# Patient Record
Sex: Male | Born: 1970
Health system: Southern US, Community
[De-identification: ages and names within clinical notes are randomized; demographics above are authoritative.]

## PROBLEM LIST (undated history)

## (undated) DIAGNOSIS — G44009 Cluster headache syndrome, unspecified, not intractable: Secondary | ICD-10-CM

## (undated) DIAGNOSIS — I10 Essential (primary) hypertension: Secondary | ICD-10-CM

## (undated) DIAGNOSIS — Z8619 Personal history of other infectious and parasitic diseases: Secondary | ICD-10-CM

## (undated) DIAGNOSIS — M1990 Unspecified osteoarthritis, unspecified site: Secondary | ICD-10-CM

## (undated) DIAGNOSIS — E785 Hyperlipidemia, unspecified: Secondary | ICD-10-CM

## (undated) DIAGNOSIS — K648 Other hemorrhoids: Secondary | ICD-10-CM

## (undated) DIAGNOSIS — M549 Dorsalgia, unspecified: Secondary | ICD-10-CM

## (undated) HISTORY — DX: Hyperlipidemia, unspecified: E78.5

## (undated) HISTORY — DX: Essential (primary) hypertension: I10

## (undated) HISTORY — DX: Residual hemorrhoidal skin tags: K64.8

## (undated) HISTORY — DX: Cluster headache syndrome, unspecified, not intractable: G44.009

## (undated) HISTORY — PX: NO PAST SURGERIES: SHX2092

## (undated) HISTORY — DX: Dorsalgia, unspecified: M54.9

## (undated) HISTORY — PX: COLONOSCOPY: SHX174

## (undated) HISTORY — DX: Personal history of other infectious and parasitic diseases: Z86.19

## (undated) HISTORY — PX: OTHER SURGICAL HISTORY: SHX169

---

## 2012-05-07 ENCOUNTER — Encounter (HOSPITAL_COMMUNITY): Payer: Self-pay | Admitting: *Deleted

## 2012-05-07 ENCOUNTER — Emergency Department (HOSPITAL_COMMUNITY)
Admission: EM | Admit: 2012-05-07 | Discharge: 2012-05-07 | Disposition: A | Discharge status: Home / Self Care | Payer: BC Managed Care – PPO | Attending: Emergency Medicine | Admitting: Emergency Medicine

## 2012-05-07 DIAGNOSIS — F17200 Nicotine dependence, unspecified, uncomplicated: Secondary | ICD-10-CM | POA: Insufficient documentation

## 2012-05-07 DIAGNOSIS — Y9289 Other specified places as the place of occurrence of the external cause: Secondary | ICD-10-CM | POA: Insufficient documentation

## 2012-05-07 DIAGNOSIS — Z79899 Other long term (current) drug therapy: Secondary | ICD-10-CM | POA: Insufficient documentation

## 2012-05-07 DIAGNOSIS — IMO0002 Reserved for concepts with insufficient information to code with codable children: Secondary | ICD-10-CM | POA: Insufficient documentation

## 2012-05-07 DIAGNOSIS — L923 Foreign body granuloma of the skin and subcutaneous tissue: Secondary | ICD-10-CM | POA: Insufficient documentation

## 2012-05-07 DIAGNOSIS — S60459A Superficial foreign body of unspecified finger, initial encounter: Secondary | ICD-10-CM

## 2012-05-07 DIAGNOSIS — Y9319 Activity, other involving water and watercraft: Secondary | ICD-10-CM | POA: Insufficient documentation

## 2012-05-07 MED ORDER — HYDROCODONE-ACETAMINOPHEN 5-325 MG PO TABS: 1.0000 | ORAL_TABLET | ORAL | Status: DC | PRN

## 2012-05-07 MED ORDER — HYDROCODONE-ACETAMINOPHEN 5-325 MG PO TABS
1.0000 | ORAL_TABLET | Freq: Once | ORAL | Status: AC
  Administered 2012-05-07: 1 via ORAL
  Filled 2012-05-07: qty 1

## 2012-05-07 MED ORDER — LIDOCAINE HCL (PF) 2 % IJ SOLN
2.0000 mL | Freq: Once | INTRAMUSCULAR | Status: DC
  Filled 2012-05-07: qty 10

## 2012-05-07 MED ORDER — SULFAMETHOXAZOLE-TRIMETHOPRIM 800-160 MG PO TABS: 1.0000 | ORAL_TABLET | Freq: Two times a day (BID) | ORAL | Status: DC

## 2012-05-07 NOTE — ED Notes (Signed)
Fish hook to left index finger.

## 2012-05-07 NOTE — ED Notes (Signed)
Suture cart to room.

## 2012-05-09 NOTE — ED Provider Notes (Signed)
History     CSN: [id number]  Arrival date & time 05/07/12  1437   First MD Initiated Contact with Patient 05/07/12 1459      Chief Complaint  Patient presents with  . Finger Injury    (Consider location/radiation/quality/duration/timing/severity/associated sxs/prior treatment) HPI Comments: Joseph Williamson presents with a fishing hook embedded in his left index finger which occurred about 1 hour prior to [place]  He has attempted to remove the hook by backing it out with no relief.  He denies numbness distal to the injury site which is at the lateral distal phalanx.  Pain is minimal unless the hook is moved.  The history is provided by the patient.    History reviewed. No pertinent past medical history.  History reviewed. No pertinent past surgical history.  No family history on file.  History  Substance Use Topics  . Smoking status: Current Every Day Smoker  . Smokeless tobacco: Not on file  . Alcohol Use: Yes     Comment: daily beer      Review of Systems  Constitutional: Negative for fever and chills.  HENT: Negative for facial swelling.   Respiratory: Negative for shortness of breath and wheezing.   Skin: Positive for wound.  Neurological: Negative for numbness.    Allergies  Review of patient's allergies indicates no known allergies.  Home Medications   Current Outpatient Rx  Name  Route  Sig  Dispense  Refill  . VERAPAMIL HCL ER 240 MG PO TBCR   Oral   Take 240 mg by mouth daily.         Marland Kitchen HYDROCODONE-ACETAMINOPHEN 5-325 MG PO TABS   Oral   Take 1 tablet by mouth every 4 (four) hours as needed for pain.   20 tablet   0   . SULFAMETHOXAZOLE-TRIMETHOPRIM 800-160 MG PO TABS   Oral   Take 1 tablet by mouth every 12 (twelve) hours.   10 tablet   0     BP 123/86  Pulse 65  Temp 97.6 F (36.4 C) (Oral)  Resp 18  Ht 6\' 1"  (1.854 m)  Wt 250 lb (113.399 kg)  BMI 32.98 kg/m2  SpO2 98%  Physical Exam    Constitutional: He is oriented to person, place, and time. He appears well-developed and well-nourished.  HENT:  Head: Normocephalic.  Cardiovascular: Normal rate.   Pulmonary/Chest: Effort normal.  Musculoskeletal:       Small hook embedded at lateral distal left index finger phalanx.  Less than 3 sec cap refill.  Neurological: He is alert and oriented to person, place, and time. No sensory deficit.  Skin: Laceration noted.    ED Course  FOREIGN BODY REMOVAL Performed by: Burgess Amor Authorized by: Burgess Amor Consent: Verbal consent obtained. Risks and benefits: risks, benefits and alternatives were discussed Consent given by: patient Patient understanding: patient states understanding of the procedure being performed Patient identity confirmed: verbally with patient Time out: Immediately prior to procedure a "time out" was called to verify the correct patient, procedure, equipment, support staff and site/side marked as required. Body area: skin Anesthesia: digital block Local anesthetic: lidocaine 2% without epinephrine Anesthetic total: 4 ml Patient cooperative: yes Localization method: probed Removal mechanism: hemostat and scalpel Dressing: dressing applied Tendon involvement: none Depth: deep Complexity: simple 1 objects recovered. Objects recovered: fishing hook Post-procedure assessment: foreign body removed Patient tolerance: Patient tolerated the procedure well with no immediate complications. Comments: Attempts to hook barb with  needle and back out hook unsuccessful.  Hook was pushed forward through skin, then shank was cut and hook end pulled through easily.  Finger was then syringe irrigated and soaked in betadine and saline .  Finger dressed.     (including critical care time)  Labs Reviewed - No data to display No results found.   1. Foreign body in skin of finger       MDM  Pt is utd on tetanus.  Bactrim,  Hydrocodone prescribed.  PRN f/u - recheck  immed for any sign of infection.        Burgess Amor, [place] 05/09/12 2152

## 2012-05-10 NOTE — ED Provider Notes (Signed)
Medical screening examination/treatment/procedure(s) were performed by non-physician practitioner and as supervising physician I was immediately available for consultation/collaboration.  Makenize Messman, MD 05/10/12 1852 

## 2012-07-20 ENCOUNTER — Encounter (HOSPITAL_COMMUNITY): Payer: Self-pay | Admitting: *Deleted

## 2012-07-20 ENCOUNTER — Emergency Department (HOSPITAL_COMMUNITY)
Admission: EM | Admit: 2012-07-20 | Discharge: 2012-07-20 | Disposition: A | Discharge status: Home / Self Care | Payer: BC Managed Care – PPO | Attending: Emergency Medicine | Admitting: Emergency Medicine

## 2012-07-20 ENCOUNTER — Emergency Department (HOSPITAL_COMMUNITY): Payer: BC Managed Care – PPO

## 2012-07-20 DIAGNOSIS — Y9289 Other specified places as the place of occurrence of the external cause: Secondary | ICD-10-CM | POA: Insufficient documentation

## 2012-07-20 DIAGNOSIS — S60552A Superficial foreign body of left hand, initial encounter: Secondary | ICD-10-CM

## 2012-07-20 DIAGNOSIS — Y9389 Activity, other specified: Secondary | ICD-10-CM | POA: Insufficient documentation

## 2012-07-20 DIAGNOSIS — W278XXA Contact with other nonpowered hand tool, initial encounter: Secondary | ICD-10-CM | POA: Insufficient documentation

## 2012-07-20 DIAGNOSIS — F17200 Nicotine dependence, unspecified, uncomplicated: Secondary | ICD-10-CM | POA: Insufficient documentation

## 2012-07-20 DIAGNOSIS — S60459A Superficial foreign body of unspecified finger, initial encounter: Secondary | ICD-10-CM | POA: Insufficient documentation

## 2012-07-20 MED ORDER — SULFAMETHOXAZOLE-TRIMETHOPRIM 800-160 MG PO TABS: 1.0000 | ORAL_TABLET | Freq: Two times a day (BID) | ORAL | Status: DC

## 2012-07-20 MED ORDER — HYDROCODONE-ACETAMINOPHEN 5-325 MG PO TABS: 2.0000 | ORAL_TABLET | ORAL | Status: DC | PRN

## 2012-07-20 NOTE — ED Provider Notes (Signed)
History    Scribed for Laray Anger, DO, the patient was seen in room TR11C/TR11C . This chart was scribed by Lewanda Rife.  CSN: [id number]  Arrival date & time 07/20/12  1208   First MD Initiated Contact with Patient 07/20/12 1519      Chief Complaint  Patient presents with  . Hand Pain    (Consider location/radiation/quality/duration/timing/severity/associated sxs/prior treatment) The history is provided by the patient and medical records.   Joseph Williamson is a 42 y.o. male who presents to the Emergency Department complaining of constant mild middle finger pain of left hand onset this morning. Pt reports he was sawing wood and a splinter penetrated his gloves into the middle finger of his left hand. Pt describes the pain as throbbing. Pt denies trying to remove splinter and taking pain medications at home to relieve pain.  Pt denies any other injuries. Pt reports his tetanus up to date. Nothing alleviates the pain and palpation worsens it.     History reviewed. No pertinent past medical history.  History reviewed. No pertinent past surgical history.  History reviewed. No pertinent family history.  History  Substance Use Topics  . Smoking status: Current Every Day Smoker  . Smokeless tobacco: Not on file  . Alcohol Use: Yes     Comment: daily beer      Review of Systems  Constitutional: Negative.   HENT: Negative.   Respiratory: Negative.   Cardiovascular: Negative.   Gastrointestinal: Negative.   Musculoskeletal: Negative.   Skin: Positive for wound (left middle finger).  Neurological: Negative.   Hematological: Negative.   Psychiatric/Behavioral: Negative.   All other systems reviewed and are negative.    Allergies  Review of patient's allergies indicates no known allergies.  Home Medications   Current Outpatient Rx  Name  Route  Sig  Dispense  Refill  . VITAMIN B-12 PO   Oral   Take 1 tablet by mouth daily.         . IBUPROFEN 200 MG  PO TABS   Oral   Take 600 mg by mouth every 8 (eight) hours as needed. For pain         . VERAPAMIL HCL ER 240 MG PO TBCR   Oral   Take 240 mg by mouth daily. To prevent cluster headaches         . HYDROCODONE-ACETAMINOPHEN 5-325 MG PO TABS   Oral   Take 2 tablets by mouth every 4 (four) hours as needed for pain.   10 tablet   0   . SULFAMETHOXAZOLE-TRIMETHOPRIM 800-160 MG PO TABS   Oral   Take 1 tablet by mouth every 12 (twelve) hours.   20 tablet   0     BP 126/81  Pulse 60  Temp 98.7 F (37.1 C) (Oral)  Resp 16  SpO2 98%  Physical Exam  Nursing note and vitals reviewed. Constitutional: He is oriented to person, place, and time. He appears well-developed and well-nourished. No distress.  HENT:  Head: Normocephalic and atraumatic.  Eyes: EOM are normal.  Neck: Neck supple. No tracheal deviation present.  Cardiovascular: Normal rate, regular rhythm and normal heart sounds.   Pulmonary/Chest: Effort normal and breath sounds normal. No respiratory distress. He has no rales.  Musculoskeletal: Normal range of motion.       Left hand middle finger small laceration to lateral side   Neurological: He is alert and oriented to person, place, and time.  Skin: Skin is warm and  dry.  Psychiatric: He has a normal mood and affect. His behavior is normal.    ED Course  FOREIGN BODY REMOVAL Date/Time: 07/20/2012 5:47 PM Performed by: Dierdre Forth Authorized by: Dierdre Forth Consent: Verbal consent obtained. Risks and benefits: risks, benefits and alternatives were discussed Consent given by: patient Patient understanding: patient states understanding of the procedure being performed Patient consent: the patient's understanding of the procedure matches consent given Procedure consent: procedure consent matches procedure scheduled Relevant documents: relevant documents present and verified Site marked: the operative site was marked Imaging studies: imaging  studies available Required items: required blood products, implants, devices, and special equipment available Patient identity confirmed: verbally with patient and arm band Time out: Immediately prior to procedure a "time out" was called to verify the correct patient, procedure, equipment, support staff and site/side marked as required. Body area: skin General location: upper extremity Location details: left long finger Anesthesia: local infiltration and digital block Local anesthetic: lidocaine 2% without epinephrine Anesthetic total: 5 ml Patient sedated: no Patient restrained: no Patient cooperative: yes Localization method: probed Removal mechanism: forceps, scalpel and irrigation Dressing: dressing applied Tendon involvement: none Depth: deep Complexity: complex 0 objects recovered. Objects recovered: none Post-procedure assessment: foreign body not removed Patient tolerance: Patient tolerated the procedure well with no immediate complications. Comments: Unable to remove the splinter, though it is  palpable   (including critical care time)  Labs Reviewed - No data to display Dg Finger Middle Left  07/20/2012  *RADIOLOGY REPORT*  Clinical Data: Rule out foreign body.  LEFT MIDDLE FINGER 2+V  Comparison: None.  Findings: No evidence of radiopaque foreign body left middle finger.  IMPRESSION: No radiopaque foreign body.   Original Report Authenticated By: Lacy Duverney, M.D.      1. Foreign body of left hand       MDM  Dustin Flock foreign body removal of the hand.  Foreign body removal attempted after digital block and I was unable to remove the foreign body.  Hand surgery paged. Will send home with antibiotics and have him followup with hand surgery in the morning for removal.  Pt neurovascularly intact, no evidence of infection at this time.  I have also discussed reasons to return immediately to the ER.  Patient expresses understanding and agrees with plan.  1.  Medications: vicodin, bactrim, usual home medications 2. Treatment: rest, drink plenty of fluids, keep wound clean and dry 3. Follow Up: Please followup with your primary doctor for discussion of your diagnoses and further evaluation after today's visit; if you do not have a primary care doctor use the resource guide provided to find one; followup with hand surgery in the morning   I personally performed the services described in this documentation, which was scribed in my presence. The recorded information has been reviewed and is accurate.   Dahlia Client Jaquesha Boroff, PA-C 07/20/12 1807

## 2012-07-20 NOTE — ED Notes (Signed)
Pt reports splinter to left middle finger x 45 mins, no distress noted at triage.

## 2012-07-20 NOTE — ED Notes (Signed)
Pt given discharge paperwork; pt verbalized understanding of discharge and f/u; no additional questions by pt; e-signature obtained; 

## 2012-07-20 NOTE — ED Notes (Signed)
Patient transported to X-ray 

## 2012-07-22 NOTE — ED Provider Notes (Signed)
Medical screening examination/treatment/procedure(s) were performed by non-physician practitioner and as supervising physician I was immediately available for consultation/collaboration.   Laray Anger, DO 07/22/12 1631

## 2014-01-17 ENCOUNTER — Encounter: Payer: Self-pay | Admitting: Gastroenterology

## 2014-03-26 ENCOUNTER — Encounter: Payer: Self-pay | Admitting: Gastroenterology

## 2014-03-26 ENCOUNTER — Ambulatory Visit (INDEPENDENT_AMBULATORY_CARE_PROVIDER_SITE_OTHER): Payer: BC Managed Care – PPO | Admitting: Gastroenterology

## 2014-03-26 VITALS — BP 132/80 | HR 84 | Ht 73.0 in | Wt 258.2 lb

## 2014-03-26 DIAGNOSIS — R7402 Elevation of levels of lactic acid dehydrogenase (LDH): Secondary | ICD-10-CM

## 2014-03-26 DIAGNOSIS — K6289 Other specified diseases of anus and rectum: Secondary | ICD-10-CM

## 2014-03-26 DIAGNOSIS — R740 Nonspecific elevation of levels of transaminase and lactic acid dehydrogenase [LDH]: Secondary | ICD-10-CM

## 2014-03-26 DIAGNOSIS — K921 Melena: Secondary | ICD-10-CM

## 2014-03-26 MED ORDER — PEG-KCL-NACL-NASULF-NA ASC-C 100 G PO SOLR: 1.0000 | Freq: Once | ORAL | Status: DC

## 2014-03-26 NOTE — Patient Instructions (Addendum)
You have been given a separate informational sheet regarding your tobacco use, the importance of quitting and local resources to help you quit.  You have been scheduled for a colonoscopy. Please follow written instructions given to you at your visit today.  Please pick up your prep kit at the pharmacy within the next 1-3 days. If you use inhalers (even only as needed), please bring them with you on the day of your procedure. Your physician has requested that you go to www.startemmi.com and enter the access code given to you at your visit today. This web site gives a general overview about your procedure. However, you should still follow specific instructions given to you by our office regarding your preparation for the procedure.  Thank you for choosing me and [facility].  Pricilla Riffle. Dagoberto Ligas., MD., Marval Regal  cc: Burnard Bunting, MD

## 2014-03-26 NOTE — Progress Notes (Signed)
History of Present Illness: This is a 43 year old male referred by Dr. Reynaldo Minium for evaluation of small-volume hematochezia and rectal pain. Symptoms have occurred intermittently for the past 6-8 months. He notes occasional heartburn and has no other gastrointestinal complaints. Records reviewed from Dr. Jacquiline Doe office showing an elevated ALT=85. Other LFTs, CBC and TSH were normal. Hemosure was negative. Denies weight loss, abdominal pain, constipation, diarrhea, change in stool caliber, melena, nausea, vomiting, dysphagia, reflux symptoms, chest pain.   No Known Allergies Outpatient Prescriptions Prior to Visit  Medication Sig Dispense Refill  . ibuprofen (ADVIL,MOTRIN) 200 MG tablet Take 600 mg by mouth every 8 (eight) hours as needed. For pain      . verapamil (CALAN-SR) 240 MG CR tablet Take 240 mg by mouth daily. To prevent cluster headaches      . Cyanocobalamin (VITAMIN B-12 PO) Take 1 tablet by mouth daily.      Marland Kitchen HYDROcodone-acetaminophen (NORCO/VICODIN) 5-325 MG per tablet Take 2 tablets by mouth every 4 (four) hours as needed for pain.  10 tablet  0  . sulfamethoxazole-trimethoprim (SEPTRA DS) 800-160 MG per tablet Take 1 tablet by mouth every 12 (twelve) hours.  20 tablet  0   No facility-administered medications prior to visit.   Past Medical History  Diagnosis Date  . Cluster headaches    Past Surgical History  Procedure Laterality Date  . None     History   Social History  . Marital Status: Single    Spouse Name: N/A    Number of Children: N/A  . Years of Education: N/A   Social History Main Topics  . Smoking status: Current Every Day Smoker  . Smokeless tobacco: None     Comment: form given 03/26/14  . Alcohol Use: Yes     Comment: daily beer  . Drug Use: No  . Sexual Activity: None   Other Topics Concern  . None   Social History Narrative  . None   Family History  Problem Relation Age of Onset  . Gout Father   . Kidney Stones Father   .  Arthritis Mother   . Colon polyps Father   . Colon cancer Neg Hx   . Diabetes Neg Hx   . Kidney disease Neg Hx   . Liver disease Neg Hx      Review of Systems: Pertinent positive and negative review of systems were noted in the above HPI section. All other review of systems were otherwise negative.   Physical Exam: General: Well developed , well nourished, no acute distress Head: Normocephalic and atraumatic Eyes:  sclerae anicteric, EOMI Ears: Normal auditory acuity Mouth: No deformity or lesions Neck: Supple, no masses or thyromegaly Lungs: Clear throughout to auscultation Heart: Regular rate and rhythm; no murmurs, rubs or bruits Abdomen: Soft, non tender and non distended. No masses, hepatosplenomegaly or hernias noted. Normal Bowel sounds Rectal: deferred to colonoscopy, recent DRE by Dr. Reynaldo Minium was unremarkable. Musculoskeletal: Symmetrical with no gross deformities  Skin: No lesions on visible extremities Pulses:  Normal pulses noted Extremities: No clubbing, cyanosis, edema or deformities noted Neurological: Alert oriented x 4, grossly nonfocal Cervical Nodes:  No significant cervical adenopathy Inguinal Nodes: No significant inguinal adenopathy Psychological:  Alert and cooperative. Normal mood and affect  Assessment and Recommendations:  1. Hematochezia and anal/rectal pain. A benign anorectal source, such as hemorrhoids, is most likely . R/O colorectal neoplasms. The risks, benefits, and alternatives to colonoscopy with possible biopsy and possible polypectomy  were discussed with the patient and they consent to proceed.   2. Elevated ALT. Recheck LFTs at routine follow up with Dr. Reynaldo Minium.

## 2014-03-29 ENCOUNTER — Ambulatory Visit (AMBULATORY_SURGERY_CENTER): Payer: BC Managed Care – PPO | Admitting: Gastroenterology

## 2014-03-29 ENCOUNTER — Encounter: Payer: Self-pay | Admitting: Gastroenterology

## 2014-03-29 VITALS — BP 103/53 | HR 61 | Temp 97.0°F | Resp 14 | Ht 73.0 in | Wt 258.0 lb

## 2014-03-29 DIAGNOSIS — K635 Polyp of colon: Secondary | ICD-10-CM

## 2014-03-29 DIAGNOSIS — D125 Benign neoplasm of sigmoid colon: Secondary | ICD-10-CM

## 2014-03-29 DIAGNOSIS — K921 Melena: Secondary | ICD-10-CM

## 2014-03-29 MED ORDER — SODIUM CHLORIDE 0.9 % IV SOLN: 500.0000 mL | INTRAVENOUS | Status: DC

## 2014-03-29 NOTE — Op Note (Signed)
[place]  [address] [place], [id number]   COLONOSCOPY PROCEDURE REPORT  PATIENT: Joseph Williamson, Joseph Williamson  MR#: [id number] BIRTHDATE: 08/31/1970 , 43  yrs. old GENDER: male ENDOSCOPIST: Ladene Artist, MD, [facility] REFERRED ZJ:IRCVELF Reynaldo Minium, M.D. PROCEDURE DATE:  03/29/2014 PROCEDURE:   Colonoscopy with snare polypectomy First Screening Colonoscopy - Avg.  risk and is 50 yrs.  old or older - No.  Prior Negative Screening - Now for repeat screening. N/A  History of Adenoma - Now for follow-up colonoscopy & has been > or = to 3 yrs.  N/A  Polyps Removed Today? Yes. ASA CLASS:   Class II INDICATIONS:hematochezia. MEDICATIONS: Monitored anesthesia care and Propofol 400 mg IV DESCRIPTION OF PROCEDURE:   After the risks benefits and alternatives of the procedure were thoroughly explained, informed consent was obtained.  The digital rectal exam revealed no abnormalities of the rectum.   The LB [id number]  endoscope was introduced through the anus and advanced to the cecum, which was identified by both the appendix and ileocecal valve. No adverse events experienced.   The quality of the prep was good, using MoviPrep  The instrument was then slowly withdrawn as the colon was fully examined.  COLON FINDINGS: Four sessile polyps measuring 5-6 mm in size were found in the sigmoid colon.  A polypectomy was performed with a cold snare.  The resection was complete, the polyp tissue was completely retrieved and sent to histology.   There was diverticulosis noted in the sigmoid colon. The examination was otherwise normal.  Retroflexed views revealed internal Grade I hemorrhoids. The time to cecum=1 minutes 59 seconds.  Withdrawal time=15 minutes 14 seconds.  The scope was withdrawn and the procedure completed.  COMPLICATIONS: There were no immediate complications.  ENDOSCOPIC IMPRESSION: 1.   Four sessile polyps in the sigmoid colon; polypectomy performed with a cold  snare 2.   Diverticulosis was noted in the sigmoid colon 3.   Grade I internal hemorrhoids  RECOMMENDATIONS: 1.  Await pathology results 2.  High fiber diet with liberal fluid intake. 3.  Repeat colonoscopy in 5 years if polyp(s) adenomatous; otherwise 10 years  eSigned:  Ladene Artist, MD, [facility] 03/29/2014 9:34 AM

## 2014-03-29 NOTE — Progress Notes (Signed)
Report to PACU, RN, vss, BBS= Clear.  

## 2014-03-29 NOTE — Patient Instructions (Signed)
Discharge instructions given with verbal understanding. Handouts on polyps,diverticulosis and hemorrhoids. Resume previous medications. YOU HAD AN ENDOSCOPIC PROCEDURE TODAY AT THE  ENDOSCOPY CENTER: Refer to the procedure report that was given to you for any specific questions about what was found during the examination.  If the procedure report does not answer your questions, please call your gastroenterologist to clarify.  If you requested that your care partner not be given the details of your procedure findings, then the procedure report has been included in a sealed envelope for you to review at your convenience later.  YOU SHOULD EXPECT: Some feelings of bloating in the abdomen. Passage of more gas than usual.  Walking can help get rid of the air that was put into your GI tract during the procedure and reduce the bloating. If you had a lower endoscopy (such as a colonoscopy or flexible sigmoidoscopy) you may notice spotting of blood in your stool or on the toilet paper. If you underwent a bowel prep for your procedure, then you may not have a normal bowel movement for a few days.  DIET: Your first meal following the procedure should be a light meal and then it is ok to progress to your normal diet.  A half-sandwich or bowl of soup is an example of a good first meal.  Heavy or fried foods are harder to digest and may make you feel nauseous or bloated.  Likewise meals heavy in dairy and vegetables can cause extra gas to form and this can also increase the bloating.  Drink plenty of fluids but you should avoid alcoholic beverages for 24 hours.  ACTIVITY: Your care partner should take you home directly after the procedure.  You should plan to take it easy, moving slowly for the rest of the day.  You can resume normal activity the day after the procedure however you should NOT DRIVE or use heavy machinery for 24 hours (because of the sedation medicines used during the test).    SYMPTOMS TO REPORT  IMMEDIATELY: A gastroenterologist can be reached at any hour.  During normal business hours, 8:30 AM to 5:00 PM Monday through Friday, call [phone].  After hours and on weekends, please call the GI answering service at [phone] who will take a message and have the physician on call contact you.   Following lower endoscopy (colonoscopy or flexible sigmoidoscopy):  Excessive amounts of blood in the stool  Significant tenderness or worsening of abdominal pains  Swelling of the abdomen that is new, acute  Fever of 100F or higher  FOLLOW UP: If any biopsies were taken you will be contacted by phone or by letter within the next 1-3 weeks.  Call your gastroenterologist if you have not heard about the biopsies in 3 weeks.  Our staff will call the home number listed on your records the next business day following your procedure to check on you and address any questions or concerns that you may have at that time regarding the information given to you following your procedure. This is a courtesy call and so if there is no answer at the home number and we have not heard from you through the emergency physician on call, we will assume that you have returned to your regular daily activities without incident.  SIGNATURES/CONFIDENTIALITY: You and/or your care partner have signed paperwork which will be entered into your electronic medical record.  These signatures attest to the fact that that the information above on your After Visit Summary   has been reviewed and is understood.  Full responsibility of the confidentiality of this discharge information lies with you and/or your care-partner. 

## 2014-03-29 NOTE — Progress Notes (Signed)
Called to room to assist during endoscopic procedure.  Patient ID and intended procedure confirmed with present staff. Received instructions for my participation in the procedure from the performing physician.  

## 2014-03-30 ENCOUNTER — Telehealth: Payer: Self-pay

## 2014-03-30 NOTE — Telephone Encounter (Signed)
No answer, left vm 

## 2014-04-05 ENCOUNTER — Encounter: Payer: Self-pay | Admitting: Gastroenterology

## 2014-04-12 DIAGNOSIS — D125 Benign neoplasm of sigmoid colon: Secondary | ICD-10-CM

## 2019-05-22 ENCOUNTER — Ambulatory Visit (HOSPITAL_COMMUNITY)
Admission: EM | Admit: 2019-05-22 | Discharge: 2019-05-22 | Disposition: A | Discharge status: Home / Self Care | Payer: PRIVATE HEALTH INSURANCE | Attending: Family Medicine | Admitting: Family Medicine

## 2019-05-22 ENCOUNTER — Other Ambulatory Visit: Payer: Self-pay

## 2019-05-22 ENCOUNTER — Encounter (HOSPITAL_COMMUNITY): Payer: Self-pay

## 2019-05-22 DIAGNOSIS — M5441 Lumbago with sciatica, right side: Secondary | ICD-10-CM

## 2019-05-22 MED ORDER — CYCLOBENZAPRINE HCL 5 MG PO TABS: 5.0000 mg | ORAL_TABLET | Freq: Three times a day (TID) | ORAL | 0 refills | Status: DC | PRN

## 2019-05-22 MED ORDER — MELOXICAM 7.5 MG PO TABS: 7.5000 mg | ORAL_TABLET | Freq: Every day | ORAL | 0 refills | Status: DC

## 2019-05-22 MED ORDER — METHYLPREDNISOLONE 4 MG PO TBPK: ORAL_TABLET | ORAL | 0 refills | Status: DC

## 2019-05-22 NOTE — ED Triage Notes (Signed)
Pt states he has sciatic nerve pain this has been going on since Last Thursday. Pt states he has tried Ibuprofen.

## 2019-05-22 NOTE — Discharge Instructions (Signed)
Light and regular activity as tolerated.  See exercises provided.  Heat application while active can help with muscle spasms.  Sleep with pillow under your knees.   Start the Medrol Dosepak today and complete. Once this is complete you may use meloxicam daily (don't take this with ibuprofen, and take it with food).   Muscle relaxer up to three times a day as needed. May cause drowsiness so may be appropriate to take before bed if you will be driving.  Please continue to follow up with your PCP and/or sports medicine if symptoms persist or continue to recur.

## 2019-05-22 NOTE — ED Provider Notes (Signed)
[place]    CSN: [id number] Arrival date & time: 05/22/19  1104      History   Chief Complaint Chief Complaint  Patient presents with  . Back Pain    HPI Joseph Williamson is a 48 y.o. male.   Joseph Williamson presents with complaints of right sided low back pain which is radiating down right thigh. Started approximately 3 days ago, no specific new injury. He does work in [employer]. Has had similar in the past but this feels more severe and is lasting longer than usual for him. He has taken ibuprofen, this has only helped some. Pain with worse with certain activities and with transition of positions. No numbness tingling or weakness. No saddle symptoms. No loss of control of bladder or bowel function.    ROS per HPI, negative if not otherwise mentioned.      Past Medical History:  Diagnosis Date  . Cluster headaches     There are no active problems to display for this patient.   Past Surgical History:  Procedure Laterality Date  . none         Home Medications    Prior to Admission medications   Medication Sig Start Date End Date Taking? Authorizing Provider  cyclobenzaprine (FLEXERIL) 5 MG tablet Take 1 tablet (5 mg total) by mouth 3 (three) times daily as needed for muscle spasms. 05/22/19   Zigmund Gottron, NP  ibuprofen (ADVIL,MOTRIN) 200 MG tablet Take 600 mg by mouth every 8 (eight) hours as needed. For pain    [provider]  MAGNESIUM CITRATE PO Take by mouth.    [provider]  meloxicam (MOBIC) 7.5 MG tablet Take 1 tablet (7.5 mg total) by mouth daily. 05/22/19   Zigmund Gottron, NP  methylPREDNISolone (MEDROL DOSEPAK) 4 MG TBPK tablet Per box instructions 05/22/19   Augusto Gamble B, NP  Riboflavin (B-2 PO) Take 1 tablet by mouth daily.    [provider]  verapamil (CALAN-SR) 240 MG CR tablet Take 240 mg by mouth daily. To prevent cluster headaches    [provider]    Family History Family  History  Problem Relation Age of Onset  . Gout Father   . Kidney Stones Father   . Colon polyps Father   . Arthritis Mother   . Colon cancer Neg Hx   . Diabetes Neg Hx   . Kidney disease Neg Hx   . Liver disease Neg Hx     Social History Social History   Tobacco Use  . Smoking status: Current Every Day Smoker    Types: Cigarettes  . Smokeless tobacco: Never Used  . Tobacco comment: form given 03/26/14  Substance Use Topics  . Alcohol use: Not Currently    Alcohol/week: 14.0 standard drinks    Types: 14 Cans of beer per week    Frequency: Never    Comment: daily beer  . Drug use: No     Allergies   Patient has no known allergies.   Review of Systems Review of Systems   Physical Exam Triage Vital Signs ED Triage Vitals  Enc Vitals Group     BP --      Pulse Rate 05/22/19 1144 81     Resp 05/22/19 1144 18     Temp 05/22/19 1144 98.4 F (36.9 C)     Temp Source 05/22/19 1144 Oral     SpO2 05/22/19 1144 100 %     Weight 05/22/19  1141 275 lb (124.7 kg)     Height --      Head Circumference --      Peak Flow --      Pain Score 05/22/19 1141 10     Pain Loc --      Pain Edu? --      Excl. in [place]? --    No data found.  Updated Vital Signs Pulse 81   Temp 98.4 F (36.9 C) (Oral)   Resp 18   Wt 275 lb (124.7 kg)   SpO2 100%   BMI 36.28 kg/m   Visual Acuity Right Eye Distance:   Left Eye Distance:   Bilateral Distance:    Right Eye Near:   Left Eye Near:    Bilateral Near:     Physical Exam Constitutional:      Appearance: He is well-developed.  Cardiovascular:     Rate and Rhythm: Normal rate.  Pulmonary:     Effort: Pulmonary effort is normal.  Musculoskeletal:     Lumbar back: He exhibits decreased range of motion, tenderness and pain. He exhibits no bony tenderness, no swelling, no edema, no deformity, no laceration, no spasm and normal pulse.     Comments: Right low back, proximal buttocks, with tenderness on palpation; pain with right  hip flexion and straight leg raise into right low back; pain with transition from sit to lay and lay to sit; strength equal bilaterally; gross sensation intact to lower extremities; ambulatory without difficulty   Skin:    General: Skin is warm and dry.  Neurological:     Mental Status: He is alert and oriented to person, place, and time.      UC Treatments / Results  Labs (all labs ordered are listed, but only abnormal results are displayed) Labs Reviewed - No data to display  EKG   Radiology No results found.  Procedures Procedures (including critical care time)  Medications Ordered in UC Medications - No data to display  Initial Impression / Assessment and Plan / UC Course  I have reviewed the triage vital signs and the nursing notes.  Pertinent labs & imaging results that were available during my care of the patient were reviewed by me and considered in my medical decision making (see chart for details).     Right sided sciatica, right low back pain. No red flag findings. Pain management discussed. Return precautions provided. Patient verbalized understanding and agreeable to plan.  Ambulatory out of clinic without difficulty.    Final Clinical Impressions(s) / UC Diagnoses   Final diagnoses:  Acute right-sided low back pain with right-sided sciatica     Discharge Instructions     Light and regular activity as tolerated.  See exercises provided.  Heat application while active can help with muscle spasms.  Sleep with pillow under your knees.   Start the Medrol Dosepak today and complete. Once this is complete you may use meloxicam daily (don't take this with ibuprofen, and take it with food).   Muscle relaxer up to three times a day as needed. May cause drowsiness so may be appropriate to take before bed if you will be driving.  Please continue to follow up with your PCP and/or sports medicine if symptoms persist or continue to recur.    ED Prescriptions     Medication Sig Dispense Auth. Provider   methylPREDNISolone (MEDROL DOSEPAK) 4 MG TBPK tablet Per box instructions 21 tablet Augusto Gamble B, NP   cyclobenzaprine (FLEXERIL) 5  MG tablet Take 1 tablet (5 mg total) by mouth 3 (three) times daily as needed for muscle spasms. 20 tablet Augusto Gamble B, NP   meloxicam (MOBIC) 7.5 MG tablet Take 1 tablet (7.5 mg total) by mouth daily. 20 tablet Zigmund Gottron, NP     PDMP not reviewed this encounter.   Zigmund Gottron, NP 05/22/19 1257

## 2019-05-23 ENCOUNTER — Ambulatory Visit: Payer: Self-pay | Admitting: Physician Assistant

## 2019-05-27 ENCOUNTER — Emergency Department (HOSPITAL_COMMUNITY)
Admission: EM | Admit: 2019-05-27 | Discharge: 2019-05-27 | Disposition: A | Discharge status: Home / Self Care | Payer: PRIVATE HEALTH INSURANCE | Attending: Emergency Medicine | Admitting: Emergency Medicine

## 2019-05-27 ENCOUNTER — Encounter (HOSPITAL_COMMUNITY): Payer: Self-pay | Admitting: Emergency Medicine

## 2019-05-27 ENCOUNTER — Other Ambulatory Visit: Payer: Self-pay

## 2019-05-27 DIAGNOSIS — M5416 Radiculopathy, lumbar region: Secondary | ICD-10-CM | POA: Diagnosis not present

## 2019-05-27 DIAGNOSIS — F17210 Nicotine dependence, cigarettes, uncomplicated: Secondary | ICD-10-CM | POA: Insufficient documentation

## 2019-05-27 DIAGNOSIS — M545 Low back pain: Secondary | ICD-10-CM | POA: Diagnosis present

## 2019-05-27 DIAGNOSIS — Z79899 Other long term (current) drug therapy: Secondary | ICD-10-CM | POA: Diagnosis not present

## 2019-05-27 MED ORDER — KETOROLAC TROMETHAMINE 15 MG/ML IJ SOLN
15.0000 mg | Freq: Once | INTRAMUSCULAR | Status: AC
  Administered 2019-05-27: 15 mg via INTRAVENOUS
  Filled 2019-05-27: qty 1

## 2019-05-27 MED ORDER — HYDROMORPHONE HCL 1 MG/ML IJ SOLN
0.5000 mg | Freq: Once | INTRAMUSCULAR | Status: AC
  Administered 2019-05-27: 0.5 mg via INTRAVENOUS
  Filled 2019-05-27: qty 1

## 2019-05-27 MED ORDER — OXYCODONE-ACETAMINOPHEN 5-325 MG PO TABS
1.0000 | ORAL_TABLET | Freq: Once | ORAL | Status: AC
  Administered 2019-05-27: 16:00:00 1 via ORAL
  Filled 2019-05-27: qty 1

## 2019-05-27 MED ORDER — ONDANSETRON HCL 4 MG/2ML IJ SOLN
4.0000 mg | Freq: Once | INTRAMUSCULAR | Status: AC
  Administered 2019-05-27: 14:00:00 4 mg via INTRAVENOUS
  Filled 2019-05-27: qty 2

## 2019-05-27 MED ORDER — OXYCODONE-ACETAMINOPHEN 5-325 MG PO TABS: 1.0000 | ORAL_TABLET | Freq: Four times a day (QID) | ORAL | 0 refills | Status: DC | PRN

## 2019-05-27 NOTE — ED Triage Notes (Signed)
The Thursday before last, the patient stepped down from ladder and felt a pinch in lower back. The pain radiated down his right leg. He went to urgent care Monday and got a steroid pack. The pain has worsened since.

## 2019-05-27 NOTE — ED Provider Notes (Signed)
[facility] DEPT Provider Note   CSN: [id number] Arrival date & time: 05/27/19  1150     History   Chief Complaint Chief Complaint  Patient presents with  . Back Pain    HPI Joseph Williamson is a 48 y.o. male.     Patient with history of lumbar radiculopathy presents the emergency department with complaint of ongoing lower back pain with radiation down into the right leg and to a lesser extent the left leg.  Patient began having pain 8 days ago.  He was seen at urgent care on 05/22/2019 and prescribed a Medrol Dosepak, meloxicam, and Flexeril.  He has been taking these but he states that the symptoms have worsened.  Pain is similar to previous lumbar radiculopathy.  This episode started after he stepped down from a ladder.  Patient denies warning symptoms of back pain including: fecal incontinence, urinary retention or overflow incontinence, night sweats, waking from sleep with back pain, unexplained fevers or weight loss, h/o cancer, IVDU, recent trauma.        Past Medical History:  Diagnosis Date  . Cluster headaches     There are no active problems to display for this patient.   Past Surgical History:  Procedure Laterality Date  . none          Home Medications    Prior to Admission medications   Medication Sig Start Date End Date Taking? Authorizing Provider  cyclobenzaprine (FLEXERIL) 5 MG tablet Take 1 tablet (5 mg total) by mouth 3 (three) times daily as needed for muscle spasms. 05/22/19   Zigmund Gottron, NP  ibuprofen (ADVIL,MOTRIN) 200 MG tablet Take 600 mg by mouth every 8 (eight) hours as needed. For pain    [provider]  MAGNESIUM CITRATE PO Take by mouth.    [provider]  meloxicam (MOBIC) 7.5 MG tablet Take 1 tablet (7.5 mg total) by mouth daily. 05/22/19   Zigmund Gottron, NP  methylPREDNISolone (MEDROL DOSEPAK) 4 MG TBPK tablet Per box instructions 05/22/19   Augusto Gamble B, NP  Riboflavin  (B-2 PO) Take 1 tablet by mouth daily.    [provider]  verapamil (CALAN-SR) 240 MG CR tablet Take 240 mg by mouth daily. To prevent cluster headaches    [provider]    Family History Family History  Problem Relation Age of Onset  . Gout Father   . Kidney Stones Father   . Colon polyps Father   . Arthritis Mother   . Colon cancer Neg Hx   . Diabetes Neg Hx   . Kidney disease Neg Hx   . Liver disease Neg Hx     Social History Social History   Tobacco Use  . Smoking status: Current Every Day Smoker    Types: Cigarettes  . Smokeless tobacco: Never Used  . Tobacco comment: form given 03/26/14  Substance Use Topics  . Alcohol use: Not Currently    Alcohol/week: 14.0 standard drinks    Types: 14 Cans of beer per week    Frequency: Never    Comment: daily beer  . Drug use: No     Allergies   Patient has no known allergies.   Review of Systems Review of Systems  Constitutional: Negative for fever and unexpected weight change.  Gastrointestinal: Negative for constipation.       Neg for fecal incontinence  Genitourinary: Negative for difficulty urinating, flank pain and hematuria.       Negative  for urinary incontinence or retention  Musculoskeletal: Positive for back pain.  Neurological: Negative for weakness and numbness.       Negative for saddle paresthesias      Physical Exam Updated Vital Signs BP (!) 152/104   Pulse 84   Temp 98.2 F (36.8 C) (Oral)   Resp 18   Ht 6\' 1"  (1.854 m)   Wt 124.7 kg   SpO2 95%   BMI 36.28 kg/m   Physical Exam Vitals signs and nursing note reviewed.  Constitutional:      General: He is in acute distress.     Appearance: He is well-developed.     Comments: Patient appears very uncomfortable.  HENT:     Head: Normocephalic and atraumatic.  Eyes:     Conjunctiva/sclera: Conjunctivae normal.  Neck:     Musculoskeletal: Normal range of motion.  Abdominal:     Palpations: Abdomen is soft.      Tenderness: There is no abdominal tenderness.  Musculoskeletal:     Right hip: He exhibits decreased range of motion (2/2 pain). He exhibits normal strength and no tenderness.     Left hip: Normal.     Cervical back: He exhibits normal range of motion, no tenderness and no bony tenderness.     Thoracic back: He exhibits normal range of motion, no tenderness and no bony tenderness.     Lumbar back: He exhibits decreased range of motion and tenderness. He exhibits no bony tenderness.     Comments: No step-off noted with palpation of spine.   Skin:    General: Skin is warm and dry.  Neurological:     Mental Status: He is alert.     Sensory: No sensory deficit.     Motor: No abnormal muscle tone.     Deep Tendon Reflexes: Reflexes are normal and symmetric.     Comments: 5/5 strength in entire lower extremities bilaterally. No sensation deficit.       ED Treatments / Results  Labs (all labs ordered are listed, but only abnormal results are displayed) Labs Reviewed - No data to display  EKG None  Radiology No results found.  Procedures Procedures (including critical care time)  Medications Ordered in ED Medications  oxyCODONE-acetaminophen (PERCOCET/ROXICET) 5-325 MG per tablet 1 tablet (has no administration in time range)  HYDROmorphone (DILAUDID) injection 0.5 mg (0.5 mg Intravenous Given 05/27/19 1356)  ondansetron (ZOFRAN) injection 4 mg (4 mg Intravenous Given 05/27/19 1355)  ketorolac (TORADOL) 15 MG/ML injection 15 mg (15 mg Intravenous Given 05/27/19 1356)     Initial Impression / Assessment and Plan / ED Course  I have reviewed the triage vital signs and the nursing notes.  Pertinent labs & imaging results that were available during my care of the patient were reviewed by me and considered in my medical decision making (see chart for details).        Patient seen and examined.  Symptoms are not responded to NSAIDs, steroids.  Will place IV to control patient's  symptoms.  Will reassess.  No red flag symptoms necessitating MRI at this time.  Vital signs reviewed and are as follows: BP (!) 152/104   Pulse 84   Temp 98.2 F (36.8 C) (Oral)   Resp 18   Ht 6\' 1"  (1.854 m)   Wt 124.7 kg   SpO2 95%   BMI 36.28 kg/m   3:27 PM pain improved after administration of parenteral medications.  Will transition to oral meds.  Patient  will continue previously prescribed medications.  Encouraged PCP/neurosurgery follow-up.  No red flag s/s of low back pain. Patient was counseled on back pain precautions and told to do activity as tolerated but do not lift, push, or pull heavy objects more than 10 pounds for the next week.  Patient counseled to use ice or heat on back for no longer than 15 minutes every hour.   Patient counseled on use of narcotic pain medications. Counseled not to combine these medications with others containing tylenol. Urged not to drink alcohol, drive, or perform any other activities that requires focus while taking these medications. The patient verbalizes understanding and agrees with the plan.  Patient urged to follow-up with PCP if pain does not improve with treatment and rest or if pain becomes recurrent. Urged to return with worsening severe pain, loss of bowel or bladder control, trouble walking.   The patient verbalizes understanding and agrees with the plan.   Final Clinical Impressions(s) / ED Diagnoses   Final diagnoses:  Lumbar radiculopathy   Patient with back pain with radicular features. No neurological deficits. Patient is ambulatory. No warning symptoms of back pain including: fecal incontinence, urinary retention or overflow incontinence, night sweats, waking from sleep with back pain, unexplained fevers or weight loss, h/o cancer, IVDU, recent trauma. No concern for cauda equina, epidural abscess, or other serious cause of back pain. Conservative measures such as rest, ice/heat and pain medicine indicated with PCP  follow-up if no improvement with conservative management.    ED Discharge Orders         Ordered    oxyCODONE-acetaminophen (PERCOCET/ROXICET) 5-325 MG tablet  Every 6 hours PRN     05/27/19 1525           Carlisle Cater, Hershal Coria 05/27/19 1528    Julianne Rice, MD 05/27/19 2138

## 2019-05-27 NOTE — Discharge Instructions (Signed)
Please read and follow all provided instructions.  Your diagnoses today include:  1. Lumbar radiculopathy     Tests performed today include:  Vital signs - see below for your results today  Medications prescribed:   Percocet (oxycodone/acetaminophen) - narcotic pain medication  DO NOT drive or perform any activities that require you to be awake and alert because this medicine can make you drowsy. BE VERY CAREFUL not to take multiple medicines containing Tylenol (also called acetaminophen). Doing so can lead to an overdose which can damage your liver and cause liver failure and possibly death.  Take any prescribed medications only as directed.  Home care instructions:   Follow any educational materials contained in this packet  Please rest, use ice or heat on your back for the next several days  Do not lift, push, pull anything more than 10 pounds for the next week  Follow-up instructions: Please follow-up with your primary care provider or the back doctor listed in the next 1 week for further evaluation of your symptoms.   Return instructions:  SEEK IMMEDIATE MEDICAL ATTENTION IF YOU HAVE:  New numbness, tingling, weakness, or problem with the use of your arms or legs  Severe back pain not relieved with medications  Loss control of your bowels or bladder  Increasing pain in any areas of the body (such as chest or abdominal pain)  Shortness of breath, dizziness, or fainting.   Worsening nausea (feeling sick to your stomach), vomiting, fever, or sweats  Any other emergent concerns regarding your health   Additional Information:  Your vital signs today were: BP (!) 152/104    Pulse 84    Temp 98.2 F (36.8 C) (Oral)    Resp 18    Ht 6\' 1"  (1.854 m)    Wt 124.7 kg    SpO2 95%    BMI 36.28 kg/m  If your blood pressure (BP) was elevated above 135/85 this visit, please have this repeated by your doctor within one month. --------------

## 2019-06-01 ENCOUNTER — Telehealth: Payer: Self-pay | Admitting: *Deleted

## 2019-06-01 NOTE — Telephone Encounter (Signed)
TOC CM received call back from pt and states he will not be able to see requested Neurosurgeon until Jan because MD is not in network, he did have a number to contact PCP and the Neurosurgeon in network he will need another referral. Explained to arrange and establish with PCP and they will send referral to specialist. Pt made appt with [facility] on 06/05/2019. [place], [facility] TOC CM [phone]

## 2019-06-05 ENCOUNTER — Encounter: Payer: Self-pay | Admitting: Physician Assistant

## 2019-06-05 ENCOUNTER — Ambulatory Visit (INDEPENDENT_AMBULATORY_CARE_PROVIDER_SITE_OTHER): Payer: PRIVATE HEALTH INSURANCE | Admitting: Physician Assistant

## 2019-06-05 ENCOUNTER — Other Ambulatory Visit: Payer: Self-pay

## 2019-06-05 DIAGNOSIS — M5416 Radiculopathy, lumbar region: Secondary | ICD-10-CM

## 2019-06-05 MED ORDER — GABAPENTIN 100 MG PO CAPS: ORAL_CAPSULE | ORAL | 0 refills | Status: DC

## 2019-06-05 NOTE — Progress Notes (Signed)
Virtual Visit via Video   I connected with patient on 06/05/19 at 10:00 AM EST by a video enabled telemedicine application and verified that I am speaking with the correct person using two identifiers.  Location patient: Home Location provider: [facility] Persons participating in the virtual visit: Patient, Provider, Marianne (Patina Moore)  I discussed the limitations of evaluation and management by telemedicine and the availability of in person appointments. The patient expressed understanding and agreed to proceed.  Subjective:   HPI:   Patient presents via [employer] today to establish care.   Patient recently having issue with a flareup of low back pain.  Notes intermittent issues with right-sided lumbar pain over the past 5 years.  Denies history of trauma or injury.  Notes he has worked in [employer] for 30 years, maintaining a very physically strenuous job.  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  Notes if he twists or steps wrong it can flareup his pain.  Has had milder episodes in the past but over the past 2 weeks has had a pretty substantial flareup of symptoms.  Patient endorses significant pain in right lumbar region with radiation into his gluteus.  Notes periodic radiation down his extremity.  Denies saddle anesthesia or change to bowel or bladder habits.  Patient endorses he has never had imaging for this issue.  Patient states symptoms got so bad that he was seen at an urgent care on 05/22/2019.  Records are in the EMR.  At that time patient was given a Medrol Dosepak, low-dose meloxicam once daily and Flexeril.  Endorsed taking as directed without any improvement in symptoms.  As such presented to the ER on 05/24/2019.  Was given pain medication in the emergency room.  Was discharged home with Percocet to take as directed for severe pain.  Was set up with a neurosurgeon, but at time of appointment found out they did not take his insurance.  Is in need  of another specialist referral.  At present, pain is about a 7-8 out of 10.  ROS:  Review of Systems  Constitutional: Negative for fever and malaise/fatigue.  Respiratory: Negative for shortness of breath.   Cardiovascular: Negative for chest pain and palpitations.  Gastrointestinal: Negative for abdominal pain, blood in stool, constipation, diarrhea, nausea and vomiting.  Genitourinary: Negative for dysuria, frequency and urgency.  Musculoskeletal: Positive for back pain and joint pain. Negative for falls and neck pain.  Neurological: Negative for sensory change, speech change, focal weakness and weakness.     There are no active problems to display for this patient.   Social History   Tobacco Use   Smoking status: Current Every Day Smoker    Packs/day: 0.25    Types: Cigarettes   Smokeless tobacco: Never Used   Tobacco comment: form given 03/26/14  Substance Use Topics   Alcohol use: Not Currently    Alcohol/week: 14.0 standard drinks    Types: 14 Cans of beer per week    Frequency: Never    Current Outpatient Medications:    ibuprofen (ADVIL,MOTRIN) 200 MG tablet, Take 600 mg by mouth every 8 (eight) hours as needed. For pain, Disp: , Rfl:    gabapentin (NEURONTIN) 100 MG capsule, Take 1 capsule PO QAM and noon, and 3 capsules in the evening., Disp: 150 capsule, Rfl: 0  No Known Allergies  Objective:   There were no vitals taken for this visit.  Patient is well-developed, well-nourished in mild painful distress.  Resting in chair at home.  Head is normocephalic, atraumatic.  No labored breathing.  Speech is clear and coherent with logical content.  Patient is alert and oriented at baseline.   Assessment and Plan:   1. Lumbar radiculopathy History of intermittent episodes of the same.  Now much more severe than prior episodes.  Since he did not respond well to steroids and low-dose NSAIDs, will begin gabapentin starting as noted below.  Will Rx 50 mg dose of  meloxicam to take.  [employer] given by ER provider for more severe pain.  We will proceed with plain films of the lower back.  Urgent referral to neurosurgery placed for patient as he will likely need MRI.  Strict ER precautions reviewed with patient.  Once we get this calmed down, patient is going to schedule an office visit so that we can discuss preventative care. - gabapentin (NEURONTIN) 100 MG capsule; Take 1 capsule PO QAM and noon, and 3 capsules in the evening.  Dispense: 150 capsule; Refill: 0 - DG Lumbar Spine Complete; Future - Ambulatory referral to Neurosurgery    Leeanne Rio, PA-C 06/05/2019

## 2019-06-05 NOTE — Progress Notes (Signed)
I have discussed the procedure for the virtual visit with the patient who has given consent to proceed with assessment and treatment.   Joseph Williamson, CMA     

## 2019-06-05 NOTE — Patient Instructions (Signed)
Instructions sent to MyChart.   You will be contacted by staff to schedule your x-ray at our [facility] office.  Please go to the [facility] office for x-ray when scheduled. We will call you with your results and alter treatment accordingly.   Joseph Williamson  [name], [name]  I have placed your referral to Neurosurgery. You should hear from them within a few days.  Please start the new dose of Meloxicam once daily.  Tylenol for breakthrough pain. The Percocet given by ER should be reserved for times of severe pain only. Start the Gabapentin, taking as directed.   If anything worsens, you will need to be seen in office or at the ER.

## 2019-06-06 ENCOUNTER — Ambulatory Visit (INDEPENDENT_AMBULATORY_CARE_PROVIDER_SITE_OTHER): Payer: PRIVATE HEALTH INSURANCE

## 2019-06-06 ENCOUNTER — Other Ambulatory Visit: Payer: PRIVATE HEALTH INSURANCE

## 2019-06-06 DIAGNOSIS — M5416 Radiculopathy, lumbar region: Secondary | ICD-10-CM

## 2019-06-09 ENCOUNTER — Other Ambulatory Visit: Payer: Self-pay | Admitting: Student

## 2019-06-09 DIAGNOSIS — M5416 Radiculopathy, lumbar region: Secondary | ICD-10-CM

## 2019-06-12 ENCOUNTER — Other Ambulatory Visit (HOSPITAL_COMMUNITY): Payer: Self-pay | Admitting: Student

## 2019-06-12 DIAGNOSIS — M5416 Radiculopathy, lumbar region: Secondary | ICD-10-CM

## 2019-06-22 ENCOUNTER — Other Ambulatory Visit: Payer: Self-pay

## 2019-06-22 ENCOUNTER — Ambulatory Visit (HOSPITAL_COMMUNITY)
Admission: RE | Admit: 2019-06-22 | Discharge: 2019-06-22 | Disposition: A | Discharge status: Home / Self Care | Payer: PRIVATE HEALTH INSURANCE | Source: Ambulatory Visit | Attending: Student | Admitting: Student

## 2019-06-22 DIAGNOSIS — M5416 Radiculopathy, lumbar region: Secondary | ICD-10-CM | POA: Insufficient documentation

## 2019-06-28 ENCOUNTER — Telehealth: Payer: Self-pay | Admitting: Student

## 2019-06-28 NOTE — Telephone Encounter (Signed)
Spoke to patient yesterday 06/27/2019 about MRI results.  Informed patient that I was going to discuss with Dr. Lacinda Axon and will contact him after plan has been established.  Left voicemail explaining to that MRI findings do not completely correlate with diffuseness of lower extremity weakness.  Recommends completing nerve conduction study for further evaluation.  Left message advising patient to contact office if he would like to proceed.

## 2019-07-07 ENCOUNTER — Other Ambulatory Visit: Payer: Self-pay | Admitting: Physician Assistant

## 2019-07-07 DIAGNOSIS — M5416 Radiculopathy, lumbar region: Secondary | ICD-10-CM

## 2019-07-07 NOTE — Telephone Encounter (Signed)
PCP gives 30 day supply, further refills will need to come from neurosurgery.

## 2019-07-12 ENCOUNTER — Other Ambulatory Visit: Payer: Self-pay | Admitting: Student

## 2019-07-12 DIAGNOSIS — M5416 Radiculopathy, lumbar region: Secondary | ICD-10-CM

## 2019-07-25 ENCOUNTER — Other Ambulatory Visit: Payer: PRIVATE HEALTH INSURANCE

## 2019-08-10 ENCOUNTER — Other Ambulatory Visit: Payer: Self-pay | Admitting: Physician Assistant

## 2019-08-10 DIAGNOSIS — M5416 Radiculopathy, lumbar region: Secondary | ICD-10-CM

## 2019-08-15 ENCOUNTER — Encounter: Payer: Self-pay | Admitting: Physician Assistant

## 2019-08-16 NOTE — Telephone Encounter (Signed)
Please schedule patient appointment - if he prefers mornings would recommend Friday or next week due to concern of weather tomorrow

## 2019-08-18 ENCOUNTER — Ambulatory Visit: Payer: PRIVATE HEALTH INSURANCE | Admitting: Physician Assistant

## 2019-08-18 ENCOUNTER — Encounter: Payer: Self-pay | Admitting: Physician Assistant

## 2019-08-18 ENCOUNTER — Ambulatory Visit (INDEPENDENT_AMBULATORY_CARE_PROVIDER_SITE_OTHER): Payer: PRIVATE HEALTH INSURANCE | Admitting: Physician Assistant

## 2019-08-18 ENCOUNTER — Other Ambulatory Visit: Payer: Self-pay

## 2019-08-18 VITALS — BP 146/104 | HR 81 | Ht 73.0 in | Wt 286.0 lb

## 2019-08-18 DIAGNOSIS — H7290 Unspecified perforation of tympanic membrane, unspecified ear: Secondary | ICD-10-CM

## 2019-08-18 DIAGNOSIS — Z8719 Personal history of other diseases of the digestive system: Secondary | ICD-10-CM

## 2019-08-18 DIAGNOSIS — I10 Essential (primary) hypertension: Secondary | ICD-10-CM

## 2019-08-18 DIAGNOSIS — Z86010 Personal history of colonic polyps: Secondary | ICD-10-CM

## 2019-08-18 NOTE — Progress Notes (Signed)
I have discussed the procedure for the virtual visit with the patient who has given consent to proceed with assessment and treatment.   Nevyn Bossman S Gwynne Kemnitz, CMA     

## 2019-08-18 NOTE — Progress Notes (Signed)
Virtual Visit via Telephone Note  I connected with Joseph Williamson on 08/18/19 at 11:30 AM EST by telephone and verified that I am speaking with the correct person using two identifiers.  Location: Patient: Home Provider: [facility]   I discussed the limitations, risks, security and privacy concerns of performing an evaluation and management service by telephone and the availability of in person appointments. I also discussed with the patient that there may be a patient responsible charge related to this service. The patient expressed understanding and agreed to proceed.  History of Present Illness: Patient presents via telephone today as he is unable to do video visit. Patient would like to discuss recent consistent elevation in BP measurements. Notes over the past several months he has been getting elevated BP readings noted at his Neurosurgeon's office. Was dealing with pain at the time so felt may be related but pain has been under control for some time with continued elevation in BP. Patient endorses good hydration and low-salt diet. Is still a smoker but only up to 2 per day. Some recent stressors so not ready to quit at present time. Has ceased all alcohol consumption. Patient denies chest pain, palpitations, lightheadedness, dizziness, vision changes or frequent headaches. Notes BP at home ranging from 140-160s/upper 80s-110.   Patient also notes having a colonoscopy in 2015 due to some rectal bleeding. Was noted to have a few polyps (benign). Was instructed to have colonoscopy every 5 years but has not had this done. Wanting to update this year.     Observations/Objective: No labored breathing.  Speech is clear and coherent with logical content.  Patient is alert and oriented at baseline.   Assessment and Plan: 1. Essential hypertension BP above goal. Thankfully asymptomatic. Has family history of CAD. Will start amlodipine 5 mg daily. Continue DASH  diet. Recommend he consider smoking cessation. Continue home BP checks daily and record. Follow-up in-office in 2 weeks for reassessment and to update exam and blood work so we can risk stratify.  2. History of hemorrhoids 3. History of colon polyps Referral to GI for screening colonoscopy placed.  - Ambulatory referral to Gastroenterology  4. Perforation of tympanic membrane, unspecified laterality Noted at end of visit. Chronic since youth. Has cerumen impaction from time to time. Wanting to speak with ENT. Referral placed. - Ambulatory referral to ENT   Follow Up Instructions:    I discussed the assessment and treatment plan with the patient. The patient was provided an opportunity to ask questions and all were answered. The patient agreed with the plan and demonstrated an understanding of the instructions.   The patient was advised to call back or seek an in-person evaluation if the symptoms worsen or if the condition fails to improve as anticipated.  I provided 20 minutes of non-face-to-face time during this encounter.   Leeanne Rio, PA-C

## 2019-08-18 NOTE — Patient Instructions (Signed)
Instructions sent to MyChart.   Please continue with low-salt diet (see below). Start the amlodipine taking as directed daily. Continue home BP checks daily and record.  We will follow-up in-office in 2 weeks for reassessment.  Will update labs at that time.  You will be contacted for assessment by ENT and Gastroenterology.   Take care!   DASH Eating Plan DASH stands for "Dietary Approaches to Stop Hypertension." The DASH eating plan is a healthy eating plan that has been shown to reduce high blood pressure (hypertension). It may also reduce your risk for type 2 diabetes, heart disease, and stroke. The DASH eating plan may also help with weight loss. What are tips for following this plan?  General guidelines  Avoid eating more than 2,300 mg (milligrams) of salt (sodium) a day. If you have hypertension, you may need to reduce your sodium intake to 1,500 mg a day.  Limit alcohol intake to no more than 1 drink a day for nonpregnant women and 2 drinks a day for men. One drink equals 12 oz of beer, 5 oz of wine, or 1 oz of hard liquor.  Work with your health care provider to maintain a healthy body weight or to lose weight. Ask what an ideal weight is for you.  Get at least 30 minutes of exercise that causes your heart to beat faster (aerobic exercise) most days of the week. Activities may include walking, swimming, or biking.  Work with your health care provider or diet and nutrition specialist (dietitian) to adjust your eating plan to your individual calorie needs. Reading food labels   Check food labels for the amount of sodium per serving. Choose foods with less than 5 percent of the Daily Value of sodium. Generally, foods with less than 300 mg of sodium per serving fit into this eating plan.  To find whole grains, look for the word "whole" as the first word in the ingredient list. Shopping  Buy products labeled as "low-sodium" or "no salt added."  Buy fresh foods. Avoid canned  foods and premade or frozen meals. Cooking  Avoid adding salt when cooking. Use salt-free seasonings or herbs instead of table salt or sea salt. Check with your health care provider or pharmacist before using salt substitutes.  Do not fry foods. Cook foods using healthy methods such as baking, boiling, grilling, and broiling instead.  Cook with heart-healthy oils, such as olive, canola, soybean, or sunflower oil. Meal planning  Eat a balanced diet that includes: ? 5 or more servings of fruits and vegetables each day. At each meal, try to fill half of your plate with fruits and vegetables. ? Up to 6-8 servings of whole grains each day. ? Less than 6 oz of lean meat, poultry, or fish each day. A 3-oz serving of meat is about the same size as a deck of cards. One egg equals 1 oz. ? 2 servings of low-fat dairy each day. ? A serving of nuts, seeds, or beans 5 times each week. ? Heart-healthy fats. Healthy fats called Omega-3 fatty acids are found in foods such as flaxseeds and coldwater fish, like sardines, salmon, and mackerel.  Limit how much you eat of the following: ? Canned or prepackaged foods. ? Food that is high in trans fat, such as fried foods. ? Food that is high in saturated fat, such as fatty meat. ? Sweets, desserts, sugary drinks, and other foods with added sugar. ? Full-fat dairy products.  Do not salt foods before eating.  Try to eat at least 2 vegetarian meals each week.  Eat more home-cooked food and less restaurant, buffet, and fast food.  When eating at a restaurant, ask that your food be prepared with less salt or no salt, if possible. What foods are recommended? The items listed may not be a complete list. Talk with your dietitian about what dietary choices are best for you. Grains Whole-grain or whole-wheat bread. Whole-grain or whole-wheat pasta. Brown rice. [place]. Bulgur. Whole-grain and low-sodium cereals. Pita bread. Low-fat, low-sodium crackers.  Whole-wheat flour tortillas. Vegetables Fresh or frozen vegetables (raw, steamed, roasted, or grilled). Low-sodium or reduced-sodium tomato and vegetable juice. Low-sodium or reduced-sodium tomato sauce and tomato paste. Low-sodium or reduced-sodium canned vegetables. Fruits All fresh, dried, or frozen fruit. Canned fruit in natural juice (without added sugar). Meat and other protein foods Skinless chicken or [place]. Ground chicken or [place]. Pork with fat trimmed off. Fish and seafood. Egg whites. Dried beans, peas, or lentils. Unsalted nuts, nut butters, and seeds. Unsalted canned beans. Lean cuts of beef with fat trimmed off. Low-sodium, lean deli meat. Dairy Low-fat (1%) or fat-free (skim) milk. Fat-free, low-fat, or reduced-fat cheeses. Nonfat, low-sodium ricotta or cottage cheese. Low-fat or nonfat yogurt. Low-fat, low-sodium cheese. Fats and oils Soft margarine without trans fats. Vegetable oil. Low-fat, reduced-fat, or light mayonnaise and salad dressings (reduced-sodium). Canola, safflower, olive, soybean, and sunflower oils. Avocado. Seasoning and other foods Herbs. Spices. Seasoning mixes without salt. Unsalted popcorn and pretzels. Fat-free sweets. What foods are not recommended? The items listed may not be a complete list. Talk with your dietitian about what dietary choices are best for you. Grains Baked goods made with fat, such as croissants, muffins, or some breads. Dry pasta or rice meal packs. Vegetables Creamed or fried vegetables. Vegetables in a cheese sauce. Regular canned vegetables (not low-sodium or reduced-sodium). Regular canned tomato sauce and paste (not low-sodium or reduced-sodium). Regular tomato and vegetable juice (not low-sodium or reduced-sodium). Angie Fava. Olives. Fruits Canned fruit in a light or heavy syrup. Fried fruit. Fruit in cream or butter sauce. Meat and other protein foods Fatty cuts of meat. Ribs. Fried meat. Berniece Salines. Sausage. Bologna and other  processed lunch meats. Salami. Fatback. Hotdogs. Bratwurst. Salted nuts and seeds. Canned beans with added salt. Canned or smoked fish. Whole eggs or egg yolks. Chicken or [place] with skin. Dairy Whole or 2% milk, cream, and half-and-half. Whole or full-fat cream cheese. Whole-fat or sweetened yogurt. Full-fat cheese. Nondairy creamers. Whipped toppings. Processed cheese and cheese spreads. Fats and oils Butter. Stick margarine. Lard. Shortening. Ghee. Bacon fat. Tropical oils, such as coconut, palm kernel, or palm oil. Seasoning and other foods Salted popcorn and pretzels. Onion salt, garlic salt, seasoned salt, table salt, and sea salt. Worcestershire sauce. Tartar sauce. Barbecue sauce. Teriyaki sauce. Soy sauce, including reduced-sodium. Steak sauce. Canned and packaged gravies. Fish sauce. Oyster sauce. Cocktail sauce. Horseradish that you find on the shelf. Ketchup. Mustard. Meat flavorings and tenderizers. Bouillon cubes. Hot sauce and Tabasco sauce. Premade or packaged marinades. Premade or packaged taco seasonings. Relishes. Regular salad dressings. Where to find more information:  National Heart, Lung, and [place]: https://wilson-eaton.com/  American Heart Association: www.heart.org Summary  The DASH eating plan is a healthy eating plan that has been shown to reduce high blood pressure (hypertension). It may also reduce your risk for type 2 diabetes, heart disease, and stroke.  With the DASH eating plan, you should limit salt (sodium) intake to 2,300 mg a day. If  you have hypertension, you may need to reduce your sodium intake to 1,500 mg a day.  When on the DASH eating plan, aim to eat more fresh fruits and vegetables, whole grains, lean proteins, low-fat dairy, and heart-healthy fats.  Work with your health care provider or diet and nutrition specialist (dietitian) to adjust your eating plan to your individual calorie needs. This information is not intended to replace advice given to  you by your health care provider. Make sure you discuss any questions you have with your health care provider. Document Revised: 05/28/2017 Document Reviewed: 06/08/2016 Elsevier Patient Education  2020 [employer].

## 2019-08-19 ENCOUNTER — Encounter: Payer: Self-pay | Admitting: Physician Assistant

## 2019-08-20 MED ORDER — AMLODIPINE BESYLATE 5 MG PO TABS: 5.0000 mg | ORAL_TABLET | Freq: Every day | ORAL | 3 refills | Status: DC

## 2019-09-11 ENCOUNTER — Other Ambulatory Visit: Payer: Self-pay

## 2019-09-11 ENCOUNTER — Encounter: Payer: Self-pay | Admitting: Physician Assistant

## 2019-09-11 ENCOUNTER — Ambulatory Visit (INDEPENDENT_AMBULATORY_CARE_PROVIDER_SITE_OTHER): Payer: PRIVATE HEALTH INSURANCE | Admitting: Physician Assistant

## 2019-09-11 VITALS — BP 120/90 | HR 72 | Temp 98.7°F | Resp 16 | Ht 71.5 in | Wt 285.0 lb

## 2019-09-11 DIAGNOSIS — E669 Obesity, unspecified: Secondary | ICD-10-CM | POA: Diagnosis not present

## 2019-09-11 DIAGNOSIS — Z0000 Encounter for general adult medical examination without abnormal findings: Secondary | ICD-10-CM

## 2019-09-11 DIAGNOSIS — I10 Essential (primary) hypertension: Secondary | ICD-10-CM | POA: Insufficient documentation

## 2019-09-11 HISTORY — DX: Essential (primary) hypertension: I10

## 2019-09-11 LAB — COMPREHENSIVE METABOLIC PANEL
ALT: 125 U/L — ABNORMAL HIGH (ref 0–53)
AST: 61 U/L — ABNORMAL HIGH (ref 0–37)
Albumin: 4.5 g/dL (ref 3.5–5.2)
Alkaline Phosphatase: 81 U/L (ref 39–117)
BUN: 16 mg/dL (ref 6–23)
CO2: 29 mEq/L (ref 19–32)
Calcium: 9.9 mg/dL (ref 8.4–10.5)
Chloride: 102 mEq/L (ref 96–112)
Creatinine, Ser: 0.92 mg/dL (ref 0.40–1.50)
GFR: 88.22 mL/min (ref >60.00)
Glucose, Bld: 90 mg/dL (ref 70–99)
Potassium: 4.7 mEq/L (ref 3.5–5.1)
Sodium: 140 mEq/L (ref 135–145)
Total Bilirubin: 0.5 mg/dL (ref 0.2–1.2)
Total Protein: 7.3 g/dL (ref 6.0–8.3)

## 2019-09-11 LAB — CBC WITH DIFFERENTIAL/PLATELET
Basophils Absolute: 0.1 10*3/uL (ref 0.0–0.1)
Basophils Relative: 0.6 % (ref 0.0–3.0)
Eosinophils Absolute: 0.4 10*3/uL (ref 0.0–0.7)
Eosinophils Relative: 4.3 % (ref 0.0–5.0)
HCT: 45.2 % (ref 39.0–52.0)
Hemoglobin: 15.4 g/dL (ref 13.0–17.0)
Lymphocytes Relative: 35.2 % (ref 12.0–46.0)
Lymphs Abs: 3.4 10*3/uL (ref 0.7–4.0)
MCHC: 34.1 g/dL (ref 30.0–36.0)
MCV: 89.3 fl (ref 78.0–100.0)
Monocytes Absolute: 0.9 10*3/uL (ref 0.1–1.0)
Monocytes Relative: 9 % (ref 3.0–12.0)
Neutro Abs: 5 10*3/uL (ref 1.4–7.7)
Neutrophils Relative %: 50.9 % (ref 43.0–77.0)
Platelets: 196 10*3/uL (ref 150.0–400.0)
RBC: 5.06 Mil/uL (ref 4.22–5.81)
RDW: 13.1 % (ref 11.5–15.5)
WBC: 9.7 10*3/uL (ref 4.0–10.5)

## 2019-09-11 LAB — HEMOGLOBIN A1C: Hgb A1c MFr Bld: 5.7 % (ref 4.6–6.5)

## 2019-09-11 LAB — LIPID PANEL
Cholesterol: 221 mg/dL — ABNORMAL HIGH (ref 0–200)
HDL: 39 mg/dL — ABNORMAL LOW (ref >39.00)
LDL Cholesterol: 157 mg/dL — ABNORMAL HIGH (ref 0–99)
NonHDL: 182.32
Total CHOL/HDL Ratio: 6
Triglycerides: 129 mg/dL (ref 0.0–149.0)
VLDL: 25.8 mg/dL (ref 0.0–40.0)

## 2019-09-11 NOTE — Patient Instructions (Signed)
Please go to the lab for blood work.   Our office will call you with your results unless you have chosen to receive results via MyChart.  If your blood work is normal we will follow-up each year for physicals and as scheduled for chronic medical problems.  If anything is abnormal we will treat accordingly and get you in for a follow-up.  Apply OTC Witch Hazel to the arm to help dry up the areas. Make sure after you shower you dry this area very well. Let me know if this is not improving within the week.   There is some inflammation in the armpits from sweat, friction. Can try to put a baby powder (non-talcum) in the area after showering and drying well to help prevent this. .   Preventive Care 41-32 Years Old, Male Preventive care refers to lifestyle choices and visits with your health care provider that can promote health and wellness. This includes:  A yearly physical exam. This is also called an annual well check.  Regular dental and eye exams.  Immunizations.  Screening for certain conditions.  Healthy lifestyle choices, such as eating a healthy diet, getting regular exercise, not using drugs or products that contain nicotine and tobacco, and limiting alcohol use. What can I expect for my preventive care visit? Physical exam Your health care provider will check:  Height and weight. These may be used to calculate body mass index (BMI), which is a measurement that tells if you are at a healthy weight.  Heart rate and blood pressure.  Your skin for abnormal spots. Counseling Your health care provider may ask you questions about:  Alcohol, tobacco, and drug use.  Emotional well-being.  Home and relationship well-being.  Sexual activity.  Eating habits.  Work and work Statistician. What immunizations do I need?  Influenza (flu) vaccine  This is recommended every year. Tetanus, diphtheria, and pertussis (Tdap) vaccine  You may need a Td booster every 10  years. Varicella (chickenpox) vaccine  You may need this vaccine if you have not already been vaccinated. Zoster (shingles) vaccine  You may need this after age 68. Measles, mumps, and rubella (MMR) vaccine  You may need at least one dose of MMR if you were born in 1957 or later. You may also need a second dose. Pneumococcal conjugate (PCV13) vaccine  You may need this if you have certain conditions and were not previously vaccinated. Pneumococcal polysaccharide (PPSV23) vaccine  You may need one or two doses if you smoke cigarettes or if you have certain conditions. Meningococcal conjugate (MenACWY) vaccine  You may need this if you have certain conditions. Hepatitis A vaccine  You may need this if you have certain conditions or if you travel or work in places where you may be exposed to hepatitis A. Hepatitis B vaccine  You may need this if you have certain conditions or if you travel or work in places where you may be exposed to hepatitis B. Haemophilus influenzae type b (Hib) vaccine  You may need this if you have certain risk factors. Human papillomavirus (HPV) vaccine  If recommended by your health care provider, you may need three doses over 6 months. You may receive vaccines as individual doses or as more than one vaccine together in one shot (combination vaccines). Talk with your health care provider about the risks and benefits of combination vaccines. What tests do I need? Blood tests  Lipid and cholesterol levels. These may be checked every 5 years, or more  frequently if you are over 50 years old.  Hepatitis C test.  Hepatitis B test. Screening  Lung cancer screening. You may have this screening every year starting at age 55 if you have a 30-pack-year history of smoking and currently smoke or have quit within the past 15 years.  Prostate cancer screening. Recommendations will vary depending on your family history and other risks.  Colorectal cancer screening.  All adults should have this screening starting at age 50 and continuing until age 75. Your health care provider may recommend screening at age 45 if you are at increased risk. You will have tests every 1-10 years, depending on your results and the type of screening test.  Diabetes screening. This is done by checking your blood sugar (glucose) after you have not eaten for a while (fasting). You may have this done every 1-3 years.  Sexually transmitted disease (STD) testing. Follow these instructions at home: Eating and drinking  Eat a diet that includes fresh fruits and vegetables, whole grains, lean protein, and low-fat dairy products.  Take vitamin and mineral supplements as recommended by your health care provider.  Do not drink alcohol if your health care provider tells you not to drink.  If you drink alcohol: ? Limit how much you have to 0-2 drinks a day. ? Be aware of how much alcohol is in your drink. In the [place] one drink equals one 12 oz bottle of beer (355 mL), one 5 oz glass of wine (148 mL), or one 1 oz glass of hard liquor (44 mL). Lifestyle  Take daily care of your teeth and gums.  Stay active. Exercise for at least 30 minutes on 5 or more days each week.  Do not use any products that contain nicotine or tobacco, such as cigarettes, e-cigarettes, and chewing tobacco. If you need help quitting, ask your health care provider.  If you are sexually active, practice safe sex. Use a condom or other form of protection to prevent STIs (sexually transmitted infections).  Talk with your health care provider about taking a low-dose aspirin every day starting at age 50. What's next?  Go to your health care provider once a year for a well check visit.  Ask your health care provider how often you should have your eyes and teeth checked.  Stay up to date on all vaccines. This information is not intended to replace advice given to you by your health care provider. Make sure you  discuss any questions you have with your health care provider. Document Revised: 06/09/2018 Document Reviewed: 06/09/2018 Elsevier Patient Education  2020 Elsevier Inc.  

## 2019-09-11 NOTE — Progress Notes (Signed)
Patient presents to clinic today for annual exam.  Patient is fasting for labs.  Chronic Issues: Hypertension -- Patient is currently on a regimen of amlodipine 5 mg daily.  Endorses taking medications as directed and tolerating well. Patient denies chest pain, palpitations, lightheadedness, dizziness, vision changes or frequent headaches.  BP Readings from Last 3 Encounters:  09/11/19 120/90  08/18/19 (!) 146/104  05/27/19 (!) 138/99     Health Maintenance: Immunizations -- Declines flu shot.  Colonoscopy -- Father with history of colon polyps. Last colonoscopy 5 years ago with polyps. Benign and no adenomas. Has appt scheduled with Dr. Fuller Plan on 09/28/2019.  Past Medical History:  Diagnosis Date  . Cluster headaches   . History of chickenpox     Past Surgical History:  Procedure Laterality Date  . NO PAST SURGERIES    . none      Current Outpatient Medications on File Prior to Visit  Medication Sig Dispense Refill  . amLODipine (NORVASC) 5 MG tablet Take 1 tablet (5 mg total) by mouth daily. 30 tablet 3  . gabapentin (NEURONTIN) 100 MG capsule TAKE.1 CAPSULE IN MORNING, 1 CAP AT NOON AND 3 CAPS IN THE EVENING. 150 capsule 3   No current facility-administered medications on file prior to visit.    No Known Allergies  Family History  Problem Relation Age of Onset  . Gout Father   . Kidney Stones Father   . Colon polyps Father   . Stroke Father   . Arthritis Mother   . Cancer Mother        Pancreactic  . Colon cancer Neg Hx   . Diabetes Neg Hx   . Kidney disease Neg Hx   . Liver disease Neg Hx     Social History   Socioeconomic History  . Marital status: Married    Spouse name: Not on file  . Number of children: Not on file  . Years of education: Not on file  . Highest education level: Not on file  Occupational History  . 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[employer]  Tobacco Use  . Smoking status: Current Every Day Smoker    Packs/day: 0.10     Types: Cigarettes  . Smokeless tobacco: Never Used  . Tobacco comment: smokes about 2 cigs per day  Substance and Sexual Activity  . Alcohol use: Not Currently    Alcohol/week: 14.0 standard drinks    Types: 14 Cans of beer per week    Comment: quit some time ago  . Drug use: No  . Sexual activity: Yes  Other Topics Concern  . Not on file  Social History Narrative  . Not on file   Social Determinants of Health   Financial Resource Strain:   . Difficulty of Paying Living Expenses:   Food Insecurity:   . Worried About Charity fundraiser in the Last Year:   . Arboriculturist in the Last Year:   Transportation Needs:   . Film/video editor (Medical):   Marland Kitchen Lack of Transportation (Non-Medical):   Physical Activity:   . Days of Exercise per Week:   . Minutes of Exercise per Session:   Stress:   . Feeling of Stress :   Social Connections:   . Frequency of Communication with Friends and Family:   . Frequency of Social Gatherings with Friends and Family:   . Attends Religious Services:   . Active Member of Clubs or Organizations:   .  Attends Archivist Meetings:   Marland Kitchen Marital Status:   Intimate Partner Violence:   . Fear of Current or Ex-Partner:   . Emotionally Abused:   Marland Kitchen Physically Abused:   . Sexually Abused:    Review of Systems  Constitutional: Negative for fever and weight loss.  HENT: Negative for ear discharge, ear pain, hearing loss and tinnitus.   Eyes: Negative for blurred vision, double vision, photophobia and pain.  Respiratory: Negative for cough and shortness of breath.   Cardiovascular: Negative for chest pain and palpitations.  Gastrointestinal: Negative for abdominal pain, blood in stool, constipation, diarrhea, heartburn, melena, nausea and vomiting.  Genitourinary: Negative for dysuria, flank pain, frequency, hematuria and urgency.  Musculoskeletal: Negative for falls.  Neurological: Negative for dizziness, loss of consciousness and  headaches.  Endo/Heme/Allergies: Negative for environmental allergies.  Psychiatric/Behavioral: Negative for depression, hallucinations, substance abuse and suicidal ideas. The patient is not nervous/anxious and does not have insomnia.    Resp 16   Ht 5' 11.5" (1.816 m)   Wt 285 lb (129.3 kg)   BMI 39.20 kg/m   Physical Exam Vitals reviewed.  Constitutional:      General: He is not in acute distress.    Appearance: He is well-developed. He is not diaphoretic.  HENT:     Head: Normocephalic and atraumatic.     Right Ear: Tympanic membrane, ear canal and external ear normal.     Left Ear: Tympanic membrane, ear canal and external ear normal.     Nose: Nose normal.     Mouth/Throat:     Pharynx: No posterior oropharyngeal erythema.  Eyes:     Conjunctiva/sclera: Conjunctivae normal.     Pupils: Pupils are equal, round, and reactive to light.  Neck:     Thyroid: No thyromegaly.  Cardiovascular:     Rate and Rhythm: Normal rate and regular rhythm.     Heart sounds: Normal heart sounds.  Pulmonary:     Effort: Pulmonary effort is normal. No respiratory distress.     Breath sounds: Normal breath sounds. No wheezing or rales.  Chest:     Chest wall: No tenderness.  Abdominal:     General: Bowel sounds are normal. There is no distension.     Palpations: Abdomen is soft. There is no mass.     Tenderness: There is no abdominal tenderness. There is no guarding or rebound.  Musculoskeletal:     Cervical back: Neck supple.  Lymphadenopathy:     Cervical: No cervical adenopathy.  Skin:    General: Skin is warm and dry.     Findings: No rash.  Neurological:     Mental Status: He is alert and oriented to person, place, and time.     Cranial Nerves: No cranial nerve deficit.    Assessment/Plan: 1. Visit for preventive health examination Depression screen negative. Health Maintenance reviewed --has appointment scheduled with GI on 09/28/19 for discussion of repeat  colonoscopy. Preventive schedule discussed and handout given in AVS. Will obtain fasting labs today.  - CBC with Differential/Platelet - Comprehensive metabolic panel - Lipid panel - Hemoglobin A1c  2. Essential hypertension BP much improved from previous checks.  Asymptomatic.  Continue current regimen.  Continue DASH diet and exercise to promote weight loss.  Fasting labs today. - Comprehensive metabolic panel - Lipid panel  3. Obesity (BMI 30-39.9) Dietary and exercise recommendations reviewed with patient today.  Will repeat fasting labs. - Comprehensive metabolic panel - Lipid panel - Hemoglobin A1c  This visit occurred during the SARS-CoV-2 public health emergency.  Safety protocols were in place, including screening questions prior to the visit, additional usage of staff PPE, and extensive cleaning of exam room while observing appropriate contact time as indicated for disinfecting solutions.     Leeanne Rio, PA-C

## 2019-09-12 DIAGNOSIS — F17200 Nicotine dependence, unspecified, uncomplicated: Secondary | ICD-10-CM | POA: Insufficient documentation

## 2019-09-12 DIAGNOSIS — H60331 Swimmer's ear, right ear: Secondary | ICD-10-CM | POA: Insufficient documentation

## 2019-09-12 DIAGNOSIS — H6983 Other specified disorders of Eustachian tube, bilateral: Secondary | ICD-10-CM | POA: Insufficient documentation

## 2019-09-12 DIAGNOSIS — J310 Chronic rhinitis: Secondary | ICD-10-CM

## 2019-09-12 DIAGNOSIS — H9193 Unspecified hearing loss, bilateral: Secondary | ICD-10-CM | POA: Insufficient documentation

## 2019-09-12 DIAGNOSIS — Z87891 Personal history of nicotine dependence: Secondary | ICD-10-CM | POA: Insufficient documentation

## 2019-09-12 HISTORY — DX: Chronic rhinitis: J31.0

## 2019-09-13 ENCOUNTER — Other Ambulatory Visit: Payer: Self-pay | Admitting: Emergency Medicine

## 2019-09-13 ENCOUNTER — Other Ambulatory Visit: Payer: PRIVATE HEALTH INSURANCE

## 2019-09-13 DIAGNOSIS — R7989 Other specified abnormal findings of blood chemistry: Secondary | ICD-10-CM

## 2019-09-13 DIAGNOSIS — R945 Abnormal results of liver function studies: Secondary | ICD-10-CM

## 2019-09-13 LAB — HEPATIC FUNCTION PANEL
ALT: 124 U/L — ABNORMAL HIGH (ref 0–53)
AST: 60 U/L — ABNORMAL HIGH (ref 0–37)
Albumin: 4.5 g/dL (ref 3.5–5.2)
Alkaline Phosphatase: 83 U/L (ref 39–117)
Bilirubin, Direct: 0.1 mg/dL (ref 0.0–0.3)
Total Bilirubin: 0.4 mg/dL (ref 0.2–1.2)
Total Protein: 7.4 g/dL (ref 6.0–8.3)

## 2019-09-13 NOTE — Addendum Note (Signed)
Addended by: Brunetta Jeans on: 09/13/2019 04:44 PM   Modules accepted: Orders

## 2019-09-14 ENCOUNTER — Other Ambulatory Visit: Payer: PRIVATE HEALTH INSURANCE

## 2019-09-14 DIAGNOSIS — R7989 Other specified abnormal findings of blood chemistry: Secondary | ICD-10-CM

## 2019-09-15 LAB — HEPATITIS PANEL, ACUTE
Hep A IgM: NONREACTIVE
Hep B C IgM: NONREACTIVE
Hepatitis B Surface Ag: NONREACTIVE
Hepatitis C Ab: NONREACTIVE
SIGNAL TO CUT-OFF: 0.01 (ref <1.00)

## 2019-09-21 ENCOUNTER — Ambulatory Visit (HOSPITAL_BASED_OUTPATIENT_CLINIC_OR_DEPARTMENT_OTHER)
Admission: RE | Admit: 2019-09-21 | Discharge: 2019-09-21 | Disposition: A | Discharge status: Home / Self Care | Payer: PRIVATE HEALTH INSURANCE | Source: Ambulatory Visit | Attending: Physician Assistant | Admitting: Physician Assistant

## 2019-09-21 ENCOUNTER — Other Ambulatory Visit: Payer: Self-pay

## 2019-09-21 DIAGNOSIS — R7989 Other specified abnormal findings of blood chemistry: Secondary | ICD-10-CM | POA: Diagnosis present

## 2019-09-26 ENCOUNTER — Ambulatory Visit: Payer: PRIVATE HEALTH INSURANCE | Admitting: Gastroenterology

## 2019-10-11 ENCOUNTER — Encounter: Payer: Self-pay | Admitting: Physician Assistant

## 2019-10-11 ENCOUNTER — Ambulatory Visit: Payer: PRIVATE HEALTH INSURANCE | Admitting: Physician Assistant

## 2019-10-11 VITALS — BP 122/90 | HR 72 | Temp 98.1°F | Ht 73.0 in | Wt 281.0 lb

## 2019-10-11 DIAGNOSIS — K641 Second degree hemorrhoids: Secondary | ICD-10-CM

## 2019-10-11 DIAGNOSIS — R7989 Other specified abnormal findings of blood chemistry: Secondary | ICD-10-CM

## 2019-10-11 MED ORDER — HYDROCORTISONE ACETATE 25 MG RE SUPP: 25.0000 mg | Freq: Two times a day (BID) | RECTAL | 0 refills | Status: AC

## 2019-10-11 MED ORDER — HYDROCORTISONE ACETATE 25 MG RE SUPP: 25.0000 mg | Freq: Two times a day (BID) | RECTAL | 0 refills | Status: DC

## 2019-10-11 NOTE — Patient Instructions (Addendum)
If you are age 47 or older, your body mass index should be between 23-30. Your Body mass index is 37.07 kg/m. If this is out of the aforementioned range listed, please consider follow up with your Primary Care Provider.  If you are age 48 or younger, your body mass index should be between 19-25. Your Body mass index is 37.07 kg/m. If this is out of the aformentioned range listed, please consider follow up with your Primary Care Provider.   [facility]'s information is below: Address: [address], [place], [place] [address]  [phone]  *Please DO NOT go directly from our office to pick up this medication! Give the pharmacy 1 day to process the prescription as this is compounded and takes time to make.  Hydrocortisone suppositories use 1 suppository twice daily for 7 days   Due to recent changes in healthcare laws, you may see the results of your imaging and laboratory studies on MyChart before your provider has had a chance to review them.  We understand that in some cases there may be results that are confusing or concerning to you. Not all laboratory results come back in the same time frame and the provider may be waiting for multiple results in order to interpret others.  Please give [place] 48 hours in order for your provider to thoroughly review all the results before contacting the office for clarification of your results.   Thank you for choosing me and [facility] Ellouise Newer, PA-C

## 2019-10-11 NOTE — Progress Notes (Signed)
Reviewed and agree with management plan.  Sharyl Panchal T. Amai Cappiello, MD FACG [facility]  

## 2019-10-11 NOTE — Progress Notes (Signed)
Chief Complaint: Hemorrhoids   HPI:    Joseph Williamson is a 49 year old male with a past medical history as listed below, known to Dr. Fuller Plan, who was referred to me by Brunetta Jeans, PA-C for a complaint of hemorrhoids.      03/29/2014 colonoscopy for hematochezia with 4 sessile polyps in the sigmoid colon, diverticulosis in the sigmoid colon and grade 1 internal hemorrhoids.  Pathology showed hyperplastic polyps.  Repeat was recommended in 10 years.    09/11/2019 CBC was normal.    09/13/2019 CMP with AST of 60 (61 09/11/2019) and ALT 124 (125 09/13/2019).  09/14/2019 acute hepatitis panel is nonreactive.    09/21/2019 right upper quadrant ultrasound with no gallstones or biliary distention, increased echogenicity of liver consistent with fatty infiltration or hepatocellular disease.  At that time it was recommended that patient work hard on diet low in saturated fats and cholesterol and increase aerobic activity.  He is also started on a daily krill oil supplement and told to avoid Tylenol and alcohol.  They are plan on rechecking LFTs in 2 to 3 months.    Today, the patient presents to clinic and explains that he has continued to see some bright red blood which "goes everywhere- on the toilet paper on the stool and in the toilet" this happens intermittently and is occurred for years.  It typically only last for 2 bowel movements and then goes away.  He tells me that he blames this entirely on his hemorrhoids.  Denies constipation or diarrhea or heavy lifting.  Tells me he only uses preparation cream on the outside which really does not help that much.  Denies rectal pain.    Does tell me his PCP is following his LFTs.    Works as a Clinical biochemist.    Denies fever, chills, weight loss, abdominal pain or change in bowel habits.  Past Medical History:  Diagnosis Date  . Cluster headaches   . History of chickenpox     Past Surgical History:  Procedure Laterality Date  . NO PAST SURGERIES    .  none      Current Outpatient Medications  Medication Sig Dispense Refill  . amLODipine (NORVASC) 5 MG tablet Take 1 tablet (5 mg total) by mouth daily. 30 tablet 3  . gabapentin (NEURONTIN) 100 MG capsule TAKE.1 CAPSULE IN MORNING, 1 CAP AT NOON AND 3 CAPS IN THE EVENING. 150 capsule 3   No current facility-administered medications for this visit.    Allergies as of 10/11/2019  . (No Known Allergies)    Family History  Problem Relation Age of Onset  . Gout Father   . Kidney Stones Father   . Colon polyps Father   . Stroke Father        2/2 COVID  . Arthritis Mother   . Cancer Mother        Pancreactic  . Colon polyps Mother   . Colon cancer Neg Hx   . Diabetes Neg Hx   . Kidney disease Neg Hx   . Liver disease Neg Hx     Social History   Socioeconomic History  . Marital status: Married    Spouse name: Not on file  . Number of children: Not on file  . Years of education: Not on file  . Highest education level: Not on file  Occupational History  . 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[employer]  Tobacco Use  . Smoking status: Current Every  Day Smoker    Packs/day: 0.10    Types: Cigarettes  . Smokeless tobacco: Never Used  . Tobacco comment: smokes about 2 cigs per day  Substance and Sexual Activity  . Alcohol use: Not Currently    Alcohol/week: 14.0 standard drinks    Types: 14 Cans of beer per week    Comment: quit some time ago  . Drug use: No  . Sexual activity: Yes  Other Topics Concern  . Not on file  Social History Narrative  . Not on file   Social Determinants of Health   Financial Resource Strain:   . Difficulty of Paying Living Expenses:   Food Insecurity:   . Worried About Charity fundraiser in the Last Year:   . Arboriculturist in the Last Year:   Transportation Needs:   . Film/video editor (Medical):   Marland Kitchen Lack of Transportation (Non-Medical):   Physical Activity:   . Days of Exercise per Week:   . Minutes of Exercise per  Session:   Stress:   . Feeling of Stress :   Social Connections:   . Frequency of Communication with Friends and Family:   . Frequency of Social Gatherings with Friends and Family:   . Attends Religious Services:   . Active Member of Clubs or Organizations:   . Attends Archivist Meetings:   Marland Kitchen Marital Status:   Intimate Partner Violence:   . Fear of Current or Ex-Partner:   . Emotionally Abused:   Marland Kitchen Physically Abused:   . Sexually Abused:     Review of Systems:    Constitutional: No weight loss, fever or chills Skin: No rash Cardiovascular: No chest pain  Respiratory: No SOB Gastrointestinal: See HPI and otherwise negative Genitourinary: No dysuria  Neurological: No headache Musculoskeletal: No new muscle or joint pain Hematologic: No  bruising Psychiatric: No history of depression or anxiety   Physical Exam:  Vital signs: BP 122/90   Pulse 72   Temp 98.1 F (36.7 C)   Ht 6\' 1"  (1.854 m)   Wt 281 lb (127.5 kg)   BMI 37.07 kg/m   Constitutional:   Pleasant Caucasian male appears to be in NAD, Well developed, Well nourished, alert and cooperative Head:  Normocephalic and atraumatic. Eyes:   PEERL, EOMI. No icterus. Conjunctiva pink. Ears:  Normal auditory acuity. Neck:  Supple Throat: Oral cavity and pharynx without inflammation, swelling or lesion.  Respiratory: Respirations even and unlabored. Lungs clear to auscultation bilaterally.   No wheezes, crackles, or rhonchi.  Cardiovascular: Normal S1, S2. No MRG. Regular rate and rhythm. No peripheral edema, cyanosis or pallor.  Gastrointestinal:  Soft, nondistended, nontender. No rebound or guarding. Normal bowel sounds. No appreciable masses or hepatomegaly. Rectal:  External: posterior hemorrhoid tag; Internal: no mass palpated; Anoscopy: Grade II hemorrhoids Msk:  Symmetrical without gross deformities. Without edema, no deformity or joint abnormality.  Neurologic:  Alert and  oriented x4;  grossly normal  neurologically.  Skin:   Dry and intact without significant lesions or rashes. Psychiatric:  Demonstrates good judgement and reason without abnormal affect or behaviors.  RELEVANT LABS AND IMAGING: CBC    Component Value Date/Time   WBC 9.7 09/11/2019 1003   RBC 5.06 09/11/2019 1003   HGB 15.4 09/11/2019 1003   HCT 45.2 09/11/2019 1003   PLT 196.0 09/11/2019 1003   MCV 89.3 09/11/2019 1003   MCHC 34.1 09/11/2019 1003   RDW 13.1 09/11/2019 1003   LYMPHSABS 3.4  09/11/2019 1003   MONOABS 0.9 09/11/2019 1003   EOSABS 0.4 09/11/2019 1003   BASOSABS 0.1 09/11/2019 1003    CMP     Component Value Date/Time   NA 140 09/11/2019 1003   K 4.7 09/11/2019 1003   CL 102 09/11/2019 1003   CO2 29 09/11/2019 1003   GLUCOSE 90 09/11/2019 1003   BUN 16 09/11/2019 1003   CREATININE 0.92 09/11/2019 1003   CALCIUM 9.9 09/11/2019 1003   PROT 7.4 09/13/2019 1640   ALBUMIN 4.5 09/13/2019 1640   AST 60 (H) 09/13/2019 1640   ALT 124 (H) 09/13/2019 1640   ALKPHOS 83 09/13/2019 1640   BILITOT 0.4 09/13/2019 1640    Assessment: 1.  Grade 2 internal hemorrhoids: Seen at time of anoscopy today, likely because of rectal bleeding, colonoscopy 03/29/2014 with hyperplastic polyps and internal hemorrhoids 2.  Elevated LFTs: Fatty liver on recent ultrasound, following with PCP in 2 to 3 months for recheck  Plan: 1.  Prescribed Anusol suppositories twice daily x7-14 days. 2.  Agree with rechecking LFTs in 2 to 3 months by PCP. 3.  Briefly discussed hemorrhoid banding.  Patient will let us know if he would like to proceed with this in the future.  Provided him with information. 4.  Patient to follow in clinic as needed with Dr. Fuller Plan or myself.  Ellouise Newer, PA-C [facility] 10/11/2019, 9:38 AM  Cc: Brunetta Jeans, PA-C

## 2019-12-13 ENCOUNTER — Other Ambulatory Visit: Payer: Self-pay | Admitting: Physician Assistant

## 2019-12-13 DIAGNOSIS — M5416 Radiculopathy, lumbar region: Secondary | ICD-10-CM

## 2019-12-13 MED ORDER — AMLODIPINE BESYLATE 5 MG PO TABS: 5.0000 mg | ORAL_TABLET | Freq: Every day | ORAL | 0 refills | Status: DC

## 2019-12-13 NOTE — Telephone Encounter (Signed)
Gabapentin LFD 08/10/19 #150 with 3 refills LOV 09/11/19 NOV none

## 2019-12-27 ENCOUNTER — Other Ambulatory Visit: Payer: Self-pay

## 2019-12-27 ENCOUNTER — Ambulatory Visit (INDEPENDENT_AMBULATORY_CARE_PROVIDER_SITE_OTHER): Payer: PRIVATE HEALTH INSURANCE

## 2019-12-27 DIAGNOSIS — R7989 Other specified abnormal findings of blood chemistry: Secondary | ICD-10-CM

## 2019-12-27 LAB — HEPATIC FUNCTION PANEL
ALT: 75 U/L — ABNORMAL HIGH (ref 0–53)
AST: 32 U/L (ref 0–37)
Albumin: 4.5 g/dL (ref 3.5–5.2)
Alkaline Phosphatase: 99 U/L (ref 39–117)
Bilirubin, Direct: 0 mg/dL (ref 0.0–0.3)
Total Bilirubin: 0.3 mg/dL (ref 0.2–1.2)
Total Protein: 6.9 g/dL (ref 6.0–8.3)

## 2020-03-13 ENCOUNTER — Other Ambulatory Visit: Payer: Self-pay | Admitting: Physician Assistant

## 2020-05-21 ENCOUNTER — Other Ambulatory Visit: Payer: Self-pay | Admitting: Physician Assistant

## 2020-05-21 DIAGNOSIS — M5416 Radiculopathy, lumbar region: Secondary | ICD-10-CM

## 2020-05-21 NOTE — Telephone Encounter (Signed)
LFD 12/14/19 #150 with 3 refills LOV 09/11/19 NOV none

## 2020-06-04 ENCOUNTER — Other Ambulatory Visit: Payer: Self-pay | Admitting: Physician Assistant

## 2020-06-13 ENCOUNTER — Encounter: Payer: Self-pay | Admitting: Physician Assistant

## 2020-06-18 ENCOUNTER — Other Ambulatory Visit: Payer: Self-pay

## 2020-06-18 ENCOUNTER — Ambulatory Visit: Payer: PRIVATE HEALTH INSURANCE | Admitting: Physician Assistant

## 2020-06-18 ENCOUNTER — Encounter: Payer: Self-pay | Admitting: Physician Assistant

## 2020-06-18 VITALS — BP 122/80 | HR 77 | Temp 98.1°F | Resp 16 | Ht 73.0 in | Wt 277.0 lb

## 2020-06-18 DIAGNOSIS — Z23 Encounter for immunization: Secondary | ICD-10-CM

## 2020-06-18 DIAGNOSIS — M19041 Primary osteoarthritis, right hand: Secondary | ICD-10-CM

## 2020-06-18 DIAGNOSIS — M19042 Primary osteoarthritis, left hand: Secondary | ICD-10-CM

## 2020-06-18 DIAGNOSIS — M65331 Trigger finger, right middle finger: Secondary | ICD-10-CM

## 2020-06-18 DIAGNOSIS — I10 Essential (primary) hypertension: Secondary | ICD-10-CM | POA: Diagnosis not present

## 2020-06-18 MED ORDER — CELECOXIB 100 MG PO CAPS: 100.0000 mg | ORAL_CAPSULE | Freq: Two times a day (BID) | ORAL | 1 refills | Status: DC

## 2020-06-18 NOTE — Patient Instructions (Signed)
Please go to the lab today for blood work.  I will call you with your results. We will alter treatment regimen(s) if indicated by your results.   You will be contacted by the hand specialist.  I have sent in a script for Celebrex to take for the pain and inflammation.  I am checking labs to make sure no other inflammatory arthritis present and that this is just osteoarthritis.   BP looks good. Keep working on diet and exercise.  Will see you in March for your physical.    DASH Eating Plan DASH stands for "Dietary Approaches to Stop Hypertension." The DASH eating plan is a healthy eating plan that has been shown to reduce high blood pressure (hypertension). It may also reduce your risk for type 2 diabetes, heart disease, and stroke. The DASH eating plan may also help with weight loss. What are tips for following this plan?  General guidelines  Avoid eating more than 2,300 mg (milligrams) of salt (sodium) a day. If you have hypertension, you may need to reduce your sodium intake to 1,500 mg a day.  Limit alcohol intake to no more than 1 drink a day for nonpregnant women and 2 drinks a day for men. One drink equals 12 oz of beer, 5 oz of wine, or 1 oz of hard liquor.  Work with your health care provider to maintain a healthy body weight or to lose weight. Ask what an ideal weight is for you.  Get at least 30 minutes of exercise that causes your heart to beat faster (aerobic exercise) most days of the week. Activities may include walking, swimming, or biking.  Work with your health care provider or diet and nutrition specialist (dietitian) to adjust your eating plan to your individual calorie needs. Reading food labels   Check food labels for the amount of sodium per serving. Choose foods with less than 5 percent of the Daily Value of sodium. Generally, foods with less than 300 mg of sodium per serving fit into this eating plan.  To find whole grains, look for the word "whole" as the  first word in the ingredient list. Shopping  Buy products labeled as "low-sodium" or "no salt added."  Buy fresh foods. Avoid canned foods and premade or frozen meals. Cooking  Avoid adding salt when cooking. Use salt-free seasonings or herbs instead of table salt or sea salt. Check with your health care provider or pharmacist before using salt substitutes.  Do not fry foods. Cook foods using healthy methods such as baking, boiling, grilling, and broiling instead.  Cook with heart-healthy oils, such as olive, canola, soybean, or sunflower oil. Meal planning  Eat a balanced diet that includes: ? 5 or more servings of fruits and vegetables each day. At each meal, try to fill half of your plate with fruits and vegetables. ? Up to 6-8 servings of whole grains each day. ? Less than 6 oz of lean meat, poultry, or fish each day. A 3-oz serving of meat is about the same size as a deck of cards. One egg equals 1 oz. ? 2 servings of low-fat dairy each day. ? A serving of nuts, seeds, or beans 5 times each week. ? Heart-healthy fats. Healthy fats called Omega-3 fatty acids are found in foods such as flaxseeds and coldwater fish, like sardines, salmon, and mackerel.  Limit how much you eat of the following: ? Canned or prepackaged foods. ? Food that is high in trans fat, such as fried foods. ?  Food that is high in saturated fat, such as fatty meat. ? Sweets, desserts, sugary drinks, and other foods with added sugar. ? Full-fat dairy products.  Do not salt foods before eating.  Try to eat at least 2 vegetarian meals each week.  Eat more home-cooked food and less restaurant, buffet, and fast food.  When eating at a restaurant, ask that your food be prepared with less salt or no salt, if possible. What foods are recommended? The items listed may not be a complete list. Talk with your dietitian about what dietary choices are best for you. Grains Whole-grain or whole-wheat bread. Whole-grain  or whole-wheat pasta. Brown rice. [place]. Bulgur. Whole-grain and low-sodium cereals. Pita bread. Low-fat, low-sodium crackers. Whole-wheat flour tortillas. Vegetables Fresh or frozen vegetables (raw, steamed, roasted, or grilled). Low-sodium or reduced-sodium tomato and vegetable juice. Low-sodium or reduced-sodium tomato sauce and tomato paste. Low-sodium or reduced-sodium canned vegetables. Fruits All fresh, dried, or frozen fruit. Canned fruit in natural juice (without added sugar). Meat and other protein foods Skinless chicken or [place]. Ground chicken or [place]. Pork with fat trimmed off. Fish and seafood. Egg whites. Dried beans, peas, or lentils. Unsalted nuts, nut butters, and seeds. Unsalted canned beans. Lean cuts of beef with fat trimmed off. Low-sodium, lean deli meat. Dairy Low-fat (1%) or fat-free (skim) milk. Fat-free, low-fat, or reduced-fat cheeses. Nonfat, low-sodium ricotta or cottage cheese. Low-fat or nonfat yogurt. Low-fat, low-sodium cheese. Fats and oils Soft margarine without trans fats. Vegetable oil. Low-fat, reduced-fat, or light mayonnaise and salad dressings (reduced-sodium). Canola, safflower, olive, soybean, and sunflower oils. Avocado. Seasoning and other foods Herbs. Spices. Seasoning mixes without salt. Unsalted popcorn and pretzels. Fat-free sweets. What foods are not recommended? The items listed may not be a complete list. Talk with your dietitian about what dietary choices are best for you. Grains Baked goods made with fat, such as croissants, muffins, or some breads. Dry pasta or rice meal packs. Vegetables Creamed or fried vegetables. Vegetables in a cheese sauce. Regular canned vegetables (not low-sodium or reduced-sodium). Regular canned tomato sauce and paste (not low-sodium or reduced-sodium). Regular tomato and vegetable juice (not low-sodium or reduced-sodium). Angie Fava. Olives. Fruits Canned fruit in a light or heavy syrup. Fried fruit.  Fruit in cream or butter sauce. Meat and other protein foods Fatty cuts of meat. Ribs. Fried meat. Berniece Salines. Sausage. Bologna and other processed lunch meats. Salami. Fatback. Hotdogs. Bratwurst. Salted nuts and seeds. Canned beans with added salt. Canned or smoked fish. Whole eggs or egg yolks. Chicken or [place] with skin. Dairy Whole or 2% milk, cream, and half-and-half. Whole or full-fat cream cheese. Whole-fat or sweetened yogurt. Full-fat cheese. Nondairy creamers. Whipped toppings. Processed cheese and cheese spreads. Fats and oils Butter. Stick margarine. Lard. Shortening. Ghee. Bacon fat. Tropical oils, such as coconut, palm kernel, or palm oil. Seasoning and other foods Salted popcorn and pretzels. Onion salt, garlic salt, seasoned salt, table salt, and sea salt. Worcestershire sauce. Tartar sauce. Barbecue sauce. Teriyaki sauce. Soy sauce, including reduced-sodium. Steak sauce. Canned and packaged gravies. Fish sauce. Oyster sauce. Cocktail sauce. Horseradish that you find on the shelf. Ketchup. Mustard. Meat flavorings and tenderizers. Bouillon cubes. Hot sauce and Tabasco sauce. Premade or packaged marinades. Premade or packaged taco seasonings. Relishes. Regular salad dressings. Where to find more information:  National Heart, Lung, and [place]: https://wilson-eaton.com/  American Heart Association: www.heart.org Summary  The DASH eating plan is a healthy eating plan that has been shown to reduce high blood  pressure (hypertension). It may also reduce your risk for type 2 diabetes, heart disease, and stroke.  With the DASH eating plan, you should limit salt (sodium) intake to 2,300 mg a day. If you have hypertension, you may need to reduce your sodium intake to 1,500 mg a day.  When on the DASH eating plan, aim to eat more fresh fruits and vegetables, whole grains, lean proteins, low-fat dairy, and heart-healthy fats.  Work with your health care provider or diet and nutrition  specialist (dietitian) to adjust your eating plan to your individual calorie needs. This information is not intended to replace advice given to you by your health care provider. Make sure you discuss any questions you have with your health care provider. Document Revised: 05/28/2017 Document Reviewed: 06/08/2016 Elsevier Patient Education  2020 [employer].

## 2020-06-18 NOTE — Progress Notes (Signed)
Patient presents to clinic today to follow-up regarding hypertension. Patient is currently on a regimen of amlodipine 5 mg daily. Patient endorses taking medications as directed. Patient denies chest pain, palpitations, lightheadedness, dizziness, vision changes or frequent headaches.  Is not regularly checking BP at home.   BP Readings from Last 3 Encounters:  06/18/20 122/80  10/11/19 122/90  09/11/19 120/90   Patient also endorses bilateral hand pain, worse at night x multiple years, worsening over the past year. Notes the third finger on R hand will get stuck if he bends it. Denies excess morning stiffness but does state hands fell slightly stiff all day. Has history of broken fingers due to football in his youth. Has 1st degree relative (mother) with RA.   Past Medical History:  Diagnosis Date   Cluster headaches    History of chickenpox     Current Outpatient Medications on File Prior to Visit  Medication Sig Dispense Refill   amLODipine (NORVASC) 5 MG tablet TAKE 1 TABLET BY MOUTH EVERY DAY 90 tablet 0   gabapentin (NEURONTIN) 100 MG capsule TAKE.1 CAPSULE IN MORNING, 1 CAPSULE AT NOON AND 3 CAPSULES IN THE EVENING. 150 capsule 0   KRILL OIL PO Take 1 capsule by mouth daily.     No current facility-administered medications on file prior to visit.    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[employer]  Tobacco Use   Smoking status: Current Every Day Smoker    Packs/day: 0.10    Types: Cigarettes   Smokeless tobacco: Never Used   Tobacco comment: smokes about 2 cigs per day  Vaping Use   Vaping Use: Never used  Substance and Sexual Activity   Alcohol use: Not Currently    Alcohol/week: 14.0 standard drinks    Types: 14 Cans of beer per week    Comment: quit some time ago   Drug use: No   Sexual activity: Yes  Other Topics Concern   Not on file  Social History Narrative   Not on file   Social Determinants of Health   Financial Resource Strain: Not on file  Food Insecurity: Not on file  Transportation Needs: Not on file  Physical Activity: Not on file  Stress: Not on file  Social Connections: Not on file   Review of Systems - See HPI.  All other ROS are negative.  BP 122/80    Pulse 77    Temp 98.1 F (36.7 C) (Temporal)    Resp 16    Ht 6\' 1"  (1.854 m)    Wt 277 lb (125.6 kg)    SpO2  98%    BMI 36.55 kg/m   Physical Exam Vitals reviewed.  Constitutional:      Appearance: Normal appearance.  HENT:     Head: Normocephalic and atraumatic.  Cardiovascular:     Rate and Rhythm: Normal rate and regular rhythm.     Pulses: Normal pulses.     Heart sounds: Normal heart sounds.  Pulmonary:     Effort: Pulmonary effort is normal.     Breath sounds: Normal breath sounds.  Musculoskeletal:     Right wrist: Normal.     Left wrist: Normal.     Right hand: No tenderness or bony tenderness. Normal range of motion. Decreased strength of finger abduction. Normal capillary refill.     Left hand: No tenderness or bony tenderness. Normal range of motion. Decreased strength of finger abduction. Normal capillary refill.     Cervical back: Neck supple.  Neurological:     General: No focal deficit present.     Mental Status: He is alert and oriented to person, place, and time.  Psychiatric:        Mood and Affect: Mood  normal.    Assessment/Plan: 1. Arthritis of both hands Suspect osteoarthritis during history and presentation however he does have some atypical symptoms and family history of RA.  Will start Celebrex.  Lab panel obtained today to rule in/rule out RA.  Referral to hand specialist placed living he also has trigger finger. - Sedimentation rate - Rheumatoid Factor - Cyclic citrul peptide antibody, IgG ([facility]) - Ambulatory referral to Hand Surgery - celecoxib (CELEBREX) 100 MG capsule; Take 1 capsule (100 mg total) by mouth 2 (two) times daily.  Dispense: 60 capsule; Refill: 1  2. Trigger middle finger of right hand Supportive measures discussed. Referral to hand specialist placed.  - Ambulatory referral to Hand Surgery  3. Primary hypertension BP normotensive.  Asymptomatic.  Continue current medication regimen.  Dietary and exercise recommendations reviewed with patient.  4. Need for immunization against influenza - Flu Vaccine QUAD 36+ mos IM  This visit occurred during the SARS-CoV-2 public health emergency.  Safety protocols were in place, including screening questions prior to the visit, additional usage of staff PPE, and extensive cleaning of exam room while observing appropriate contact time as indicated for disinfecting solutions.     Leeanne Rio, PA-C

## 2020-06-19 ENCOUNTER — Other Ambulatory Visit: Payer: Self-pay | Admitting: Physician Assistant

## 2020-06-20 ENCOUNTER — Other Ambulatory Visit: Payer: Self-pay

## 2020-06-20 ENCOUNTER — Ambulatory Visit (INDEPENDENT_AMBULATORY_CARE_PROVIDER_SITE_OTHER): Payer: PRIVATE HEALTH INSURANCE

## 2020-06-20 DIAGNOSIS — M19042 Primary osteoarthritis, left hand: Secondary | ICD-10-CM | POA: Diagnosis not present

## 2020-06-20 DIAGNOSIS — M19041 Primary osteoarthritis, right hand: Secondary | ICD-10-CM | POA: Diagnosis not present

## 2020-06-20 LAB — SEDIMENTATION RATE: Sed Rate: 12 mm/hr (ref 0–15)

## 2020-06-24 LAB — RHEUMATOID FACTOR: Rheumatoid fact SerPl-aCnc: <14 IU/mL (ref <14)

## 2020-06-24 LAB — CYCLIC CITRUL PEPTIDE ANTIBODY, IGG: Cyclic Citrullin Peptide Ab: <16 UNITS

## 2020-06-25 ENCOUNTER — Ambulatory Visit (INDEPENDENT_AMBULATORY_CARE_PROVIDER_SITE_OTHER): Payer: PRIVATE HEALTH INSURANCE

## 2020-06-25 ENCOUNTER — Other Ambulatory Visit: Payer: Self-pay

## 2020-06-25 ENCOUNTER — Ambulatory Visit (INDEPENDENT_AMBULATORY_CARE_PROVIDER_SITE_OTHER): Payer: PRIVATE HEALTH INSURANCE | Admitting: Orthopaedic Surgery

## 2020-06-25 ENCOUNTER — Ambulatory Visit: Payer: Self-pay

## 2020-06-25 DIAGNOSIS — M79641 Pain in right hand: Secondary | ICD-10-CM

## 2020-06-25 DIAGNOSIS — M79642 Pain in left hand: Secondary | ICD-10-CM

## 2020-06-25 NOTE — Progress Notes (Signed)
Office Visit Note   Patient: Joseph Williamson           Date of Birth: 08-26-1970           MRN: [id number] Visit Date: 06/25/2020              Requested by: Waldon Merl, PA-C [address] [place],  [place] [address] PCP: Waldon Merl, PA-C   Assessment & Plan: Visit Diagnoses:  1. Bilateral hand pain     Plan: Impression is bilateral hand carpal tunnel syndrome.  We will refer the patient to Dr. Alvester Morin for bilateral upper extremity nerve conduction study/EMG.  Follow-Up Instructions: Return for after EMG/NCS.   Orders:  Orders Placed This Encounter  Procedures  . XR Hand Complete Right  . XR Hand Complete Left   No orders of the defined types were placed in this encounter.     Procedures: No procedures performed   Clinical Data: No additional findings.   Subjective: Chief Complaint  Patient presents with  . Left Hand - Pain  . Right Hand - Pain    HPI patient is a pleasant 49 year old left-hand-dominant gentleman who comes in today with bilateral hand pain and paresthesias for the past few years both equally as bad.  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  His symptoms are primarily to the long and ring fingers.  Working with his hands and while sleeping at night seems to aggravate his symptoms.  He does take an occasional Advil without relief.  Review of Systems   Objective: Vital Signs: There were no vitals taken for this visit.  Physical Exam well-developed well-nourished gentleman in no acute distress.  Alert oriented x3.  Ortho Exam bilateral hand exam shows positive Phalen and Tinel at the wrist.  Positive Tinel at the elbow on the right negative on the left.  No thenar atrophy.  Specialty Comments:  No specialty comments available.  Imaging: No new imaging   PMFS History: Patient Active Problem List   Diagnosis Date Noted  . Smoker 09/12/2019  .  Rhinitis medicamentosa 09/12/2019  . Dysfunction of both eustachian tubes 09/12/2019  . Bilateral hearing loss 09/12/2019  . Hypertension 09/11/2019   Past Medical History:  Diagnosis Date  . Cluster headaches   . History of chickenpox     Family History  Problem Relation Age of Onset  . Gout Father   . Kidney Stones Father   . Colon polyps Father   . Stroke Father        2/2 COVID  . Arthritis Mother   . Cancer Mother        Pancreactic  . Colon polyps Mother   . Pancreatic cancer Mother   . Colon cancer Neg Hx   . Diabetes Neg Hx   . Kidney disease Neg Hx   . Liver disease Neg Hx     Past Surgical History:  Procedure Laterality Date  . NO PAST SURGERIES    . none     Social History   Occupational History  . Occupation: Event organiser: [employer]  Tobacco Use  . Smoking status: Current Every Day Smoker    Packs/day: 0.10    Types: Cigarettes  . Smokeless tobacco: Never Used  . Tobacco comment: smokes about 2 cigs per day  Vaping Use  . Vaping Use: Never  used  Substance and Sexual Activity  . Alcohol use: Not Currently    Alcohol/week: 14.0 standard drinks    Types: 14 Cans of beer per week    Comment: quit some time ago  . Drug use: No  . Sexual activity: Yes

## 2020-07-14 ENCOUNTER — Other Ambulatory Visit: Payer: Self-pay | Admitting: Physician Assistant

## 2020-07-14 DIAGNOSIS — M5416 Radiculopathy, lumbar region: Secondary | ICD-10-CM

## 2020-07-26 ENCOUNTER — Encounter: Payer: PRIVATE HEALTH INSURANCE | Admitting: Physical Medicine and Rehabilitation

## 2020-07-26 ENCOUNTER — Telehealth: Payer: Self-pay

## 2020-07-26 NOTE — Telephone Encounter (Signed)
Patient called he has been exposed to Covid and isn't feeling well he would like to reschedule his appointment for [phone]

## 2020-07-26 NOTE — Telephone Encounter (Signed)
Called pt and left message with someone to have him call us back.

## 2020-08-22 ENCOUNTER — Other Ambulatory Visit: Payer: Self-pay | Admitting: Physician Assistant

## 2020-08-22 DIAGNOSIS — M19041 Primary osteoarthritis, right hand: Secondary | ICD-10-CM

## 2020-10-22 ENCOUNTER — Telehealth: Payer: Self-pay | Admitting: Physical Medicine and Rehabilitation

## 2020-10-22 NOTE — Telephone Encounter (Signed)
Patient called to cancel appt. He states he has to work out of town and will cal to reschedule. Please call pt he asked to confirm cancellation

## 2020-10-22 NOTE — Telephone Encounter (Signed)
Called pt and LVM #1 

## 2020-10-23 NOTE — Telephone Encounter (Signed)
Pt called and cancel appt.

## 2020-10-25 ENCOUNTER — Encounter: Payer: Managed Care, Other (non HMO) | Admitting: Physical Medicine and Rehabilitation

## 2020-11-14 IMAGING — US US ABDOMEN LIMITED
1 series · 14 of 25 positions shown · non-contrast
Comparison: No recent.

CLINICAL DATA: Elevated LFTs.

EXAM:
ULTRASOUND ABDOMEN LIMITED RIGHT UPPER QUADRANT

[Series 1: us abdomen limited · 14 of 28 slices shown]
[im 1/28]
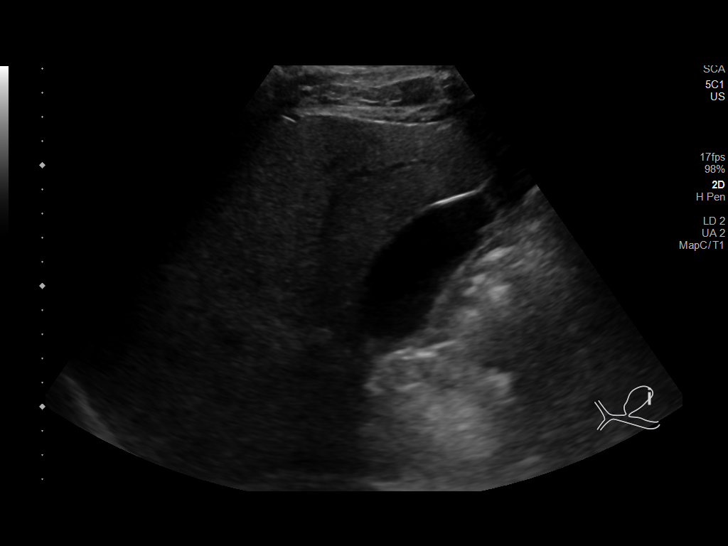
[im 3/28]
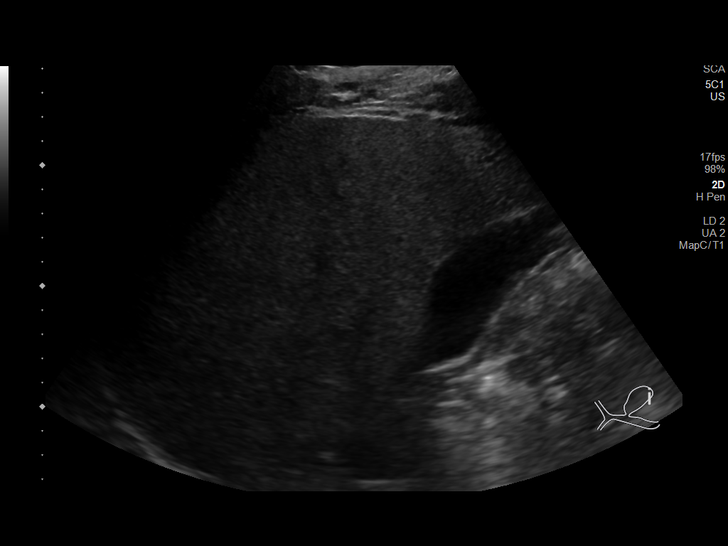
[im 5/28]
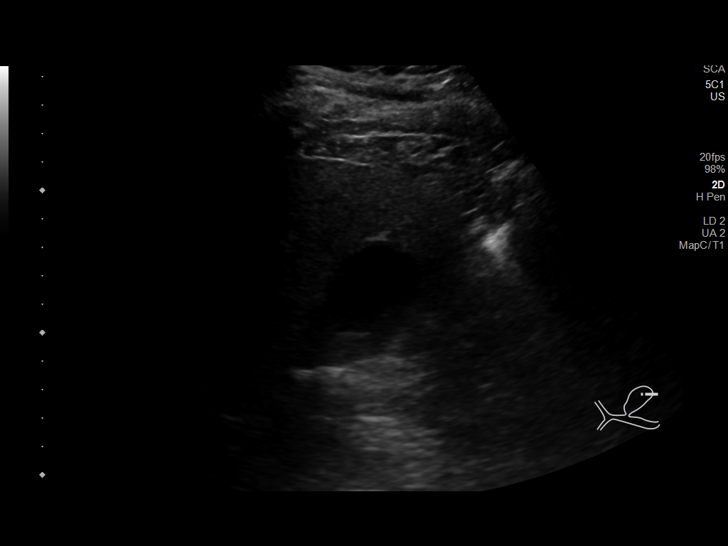
[im 7/28]
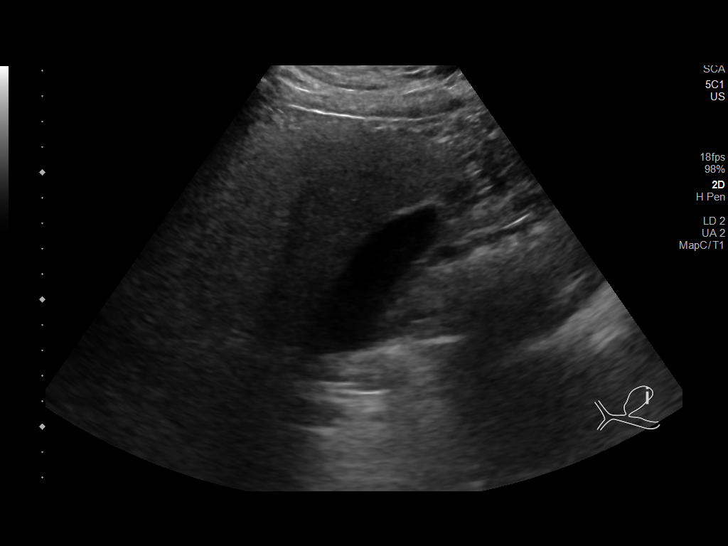
[im 10/28]
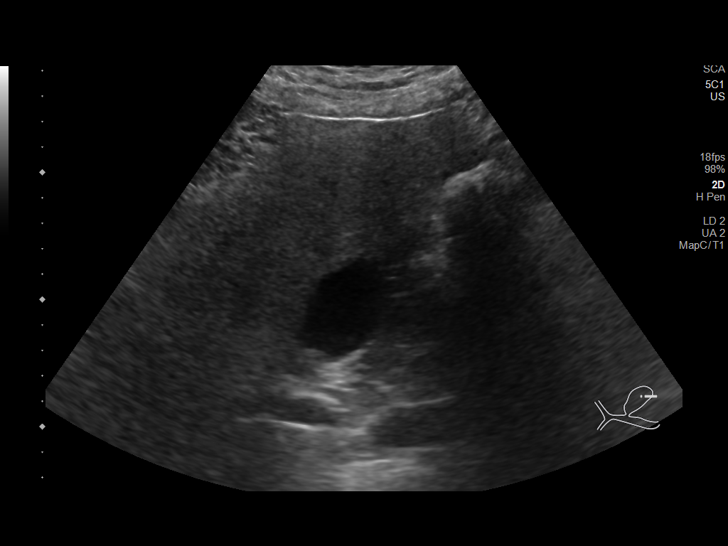
[im 11/28]
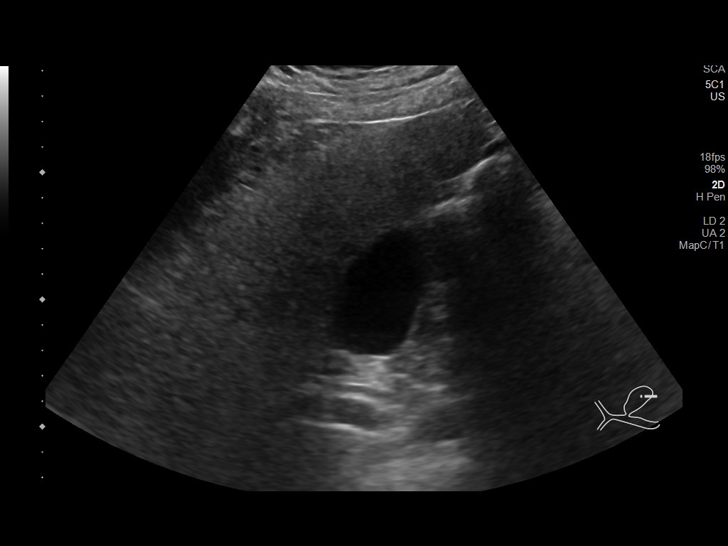
[im 13/28]
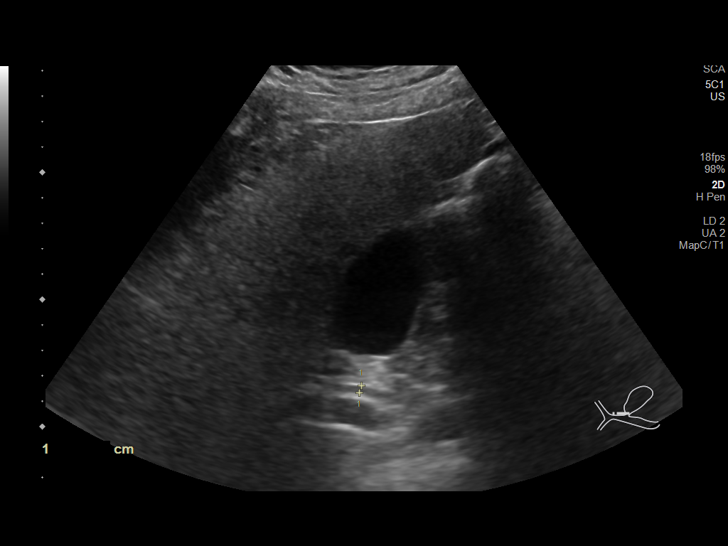
[im 15/28]
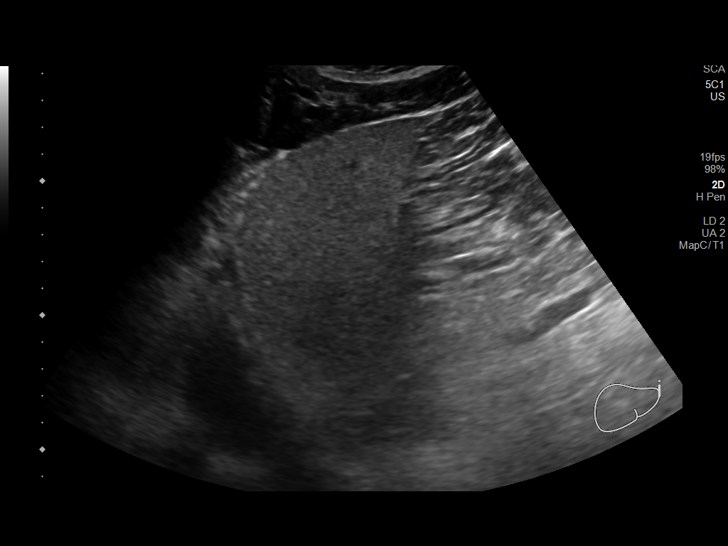
[im 17/28]
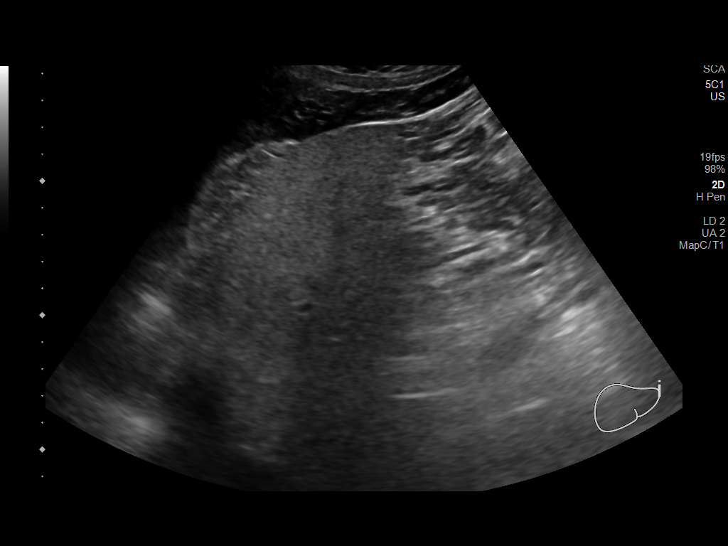
[im 19/28]
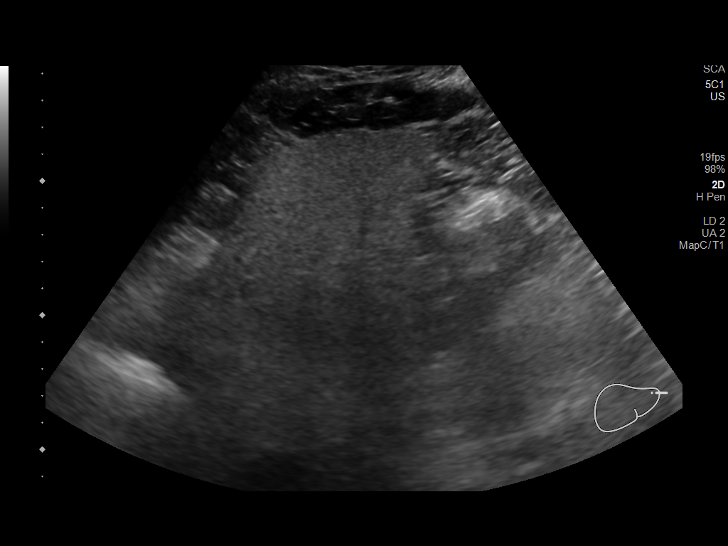
[im 21/28]
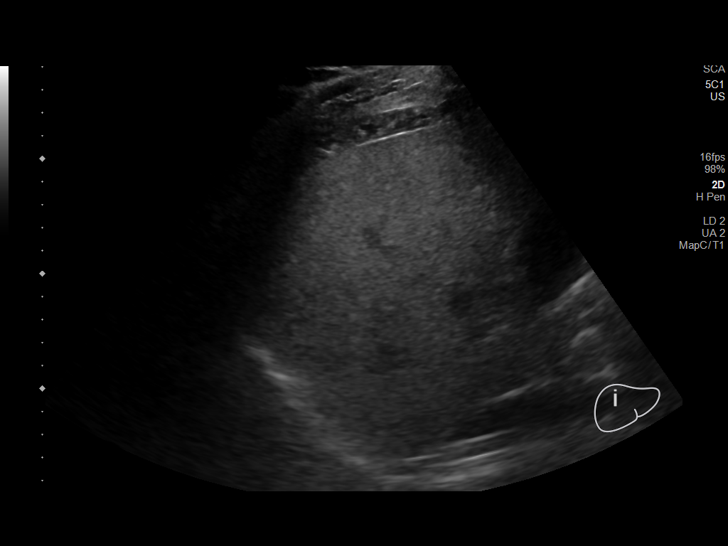
[im 23/28]
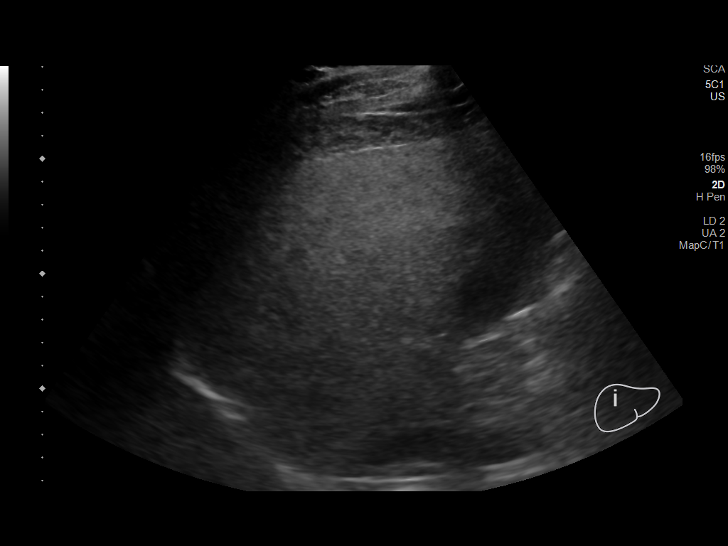
[im 25/28]
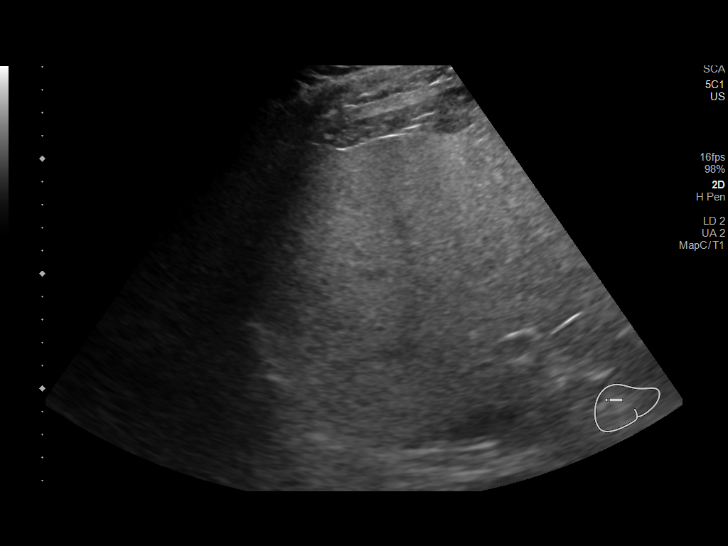
[im 28/28]
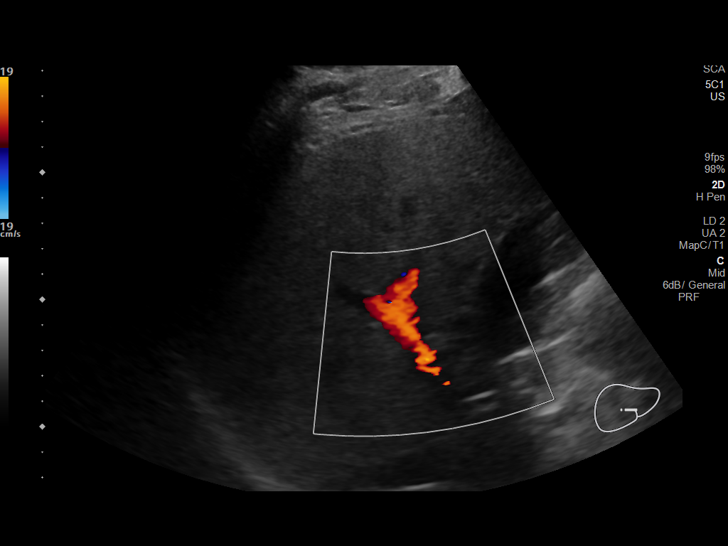

[14 of 25 positions shown; findings below may reference images not displayed]

FINDINGS: Gallbladder:

No gallstones or wall thickening visualized. No sonographic Murphy
sign noted by sonographer.

Common bile duct:

Diameter: 4.0 mm.

Liver:

Increased echogenicity consistent fatty infiltration or
hepatocellular disease. Portal vein is patent on color Doppler
imaging with normal direction of blood flow towards the liver.

Other: None.
IMPRESSION: 1.  No gallstones or biliary distention.

2. Increased echogenicity of the liver consistent with fatty
infiltration or hepatocellular disease.

## 2020-12-15 ENCOUNTER — Other Ambulatory Visit: Payer: Self-pay | Admitting: Family

## 2020-12-15 DIAGNOSIS — M19042 Primary osteoarthritis, left hand: Secondary | ICD-10-CM

## 2020-12-22 ENCOUNTER — Emergency Department (HOSPITAL_BASED_OUTPATIENT_CLINIC_OR_DEPARTMENT_OTHER)
Admission: EM | Admit: 2020-12-22 | Discharge: 2020-12-22 | Disposition: A | Discharge status: Home / Self Care | Payer: 59 | Attending: Emergency Medicine | Admitting: Emergency Medicine

## 2020-12-22 ENCOUNTER — Encounter (HOSPITAL_BASED_OUTPATIENT_CLINIC_OR_DEPARTMENT_OTHER): Payer: Self-pay | Admitting: Emergency Medicine

## 2020-12-22 DIAGNOSIS — Z79899 Other long term (current) drug therapy: Secondary | ICD-10-CM | POA: Insufficient documentation

## 2020-12-22 DIAGNOSIS — H60391 Other infective otitis externa, right ear: Secondary | ICD-10-CM | POA: Insufficient documentation

## 2020-12-22 DIAGNOSIS — F17210 Nicotine dependence, cigarettes, uncomplicated: Secondary | ICD-10-CM | POA: Insufficient documentation

## 2020-12-22 DIAGNOSIS — I10 Essential (primary) hypertension: Secondary | ICD-10-CM | POA: Insufficient documentation

## 2020-12-22 DIAGNOSIS — H9201 Otalgia, right ear: Secondary | ICD-10-CM | POA: Diagnosis not present

## 2020-12-22 DIAGNOSIS — R69 Illness, unspecified: Secondary | ICD-10-CM | POA: Diagnosis not present

## 2020-12-22 HISTORY — DX: Unspecified osteoarthritis, unspecified site: M19.90

## 2020-12-22 MED ORDER — CIPRO HC 0.2-1 % OT SUSP: 3.0000 [drp] | Freq: Two times a day (BID) | OTIC | 0 refills | Status: DC

## 2020-12-22 MED ORDER — AMOXICILLIN 500 MG PO CAPS: 500.0000 mg | ORAL_CAPSULE | Freq: Three times a day (TID) | ORAL | 0 refills | Status: DC

## 2020-12-22 MED ORDER — HYDROCODONE-ACETAMINOPHEN 5-325 MG PO TABS: 1.0000 | ORAL_TABLET | ORAL | 0 refills | Status: DC | PRN

## 2020-12-22 MED ORDER — AMOXICILLIN 500 MG PO CAPS
500.0000 mg | ORAL_CAPSULE | Freq: Once | ORAL | Status: AC
  Administered 2020-12-22: 13:00:00 500 mg via ORAL
  Filled 2020-12-22: qty 1

## 2020-12-22 NOTE — ED Provider Notes (Signed)
[name] Provider Note   CSN: [id number] Arrival date & time: 12/22/20  1114     History Chief Complaint  Patient presents with   Otalgia    Joseph Williamson is a 50 y.o. male.  Pt presents to the ED today with right ear pain.  Pt said he ruptured his eardrum several years ago and intermittently has pain to his right ear.  The pt said he has had pain to the right ear since last night.  The pt denies any f/c.      Past Medical History:  Diagnosis Date   Arthritis    Cluster headaches    History of chickenpox     Patient Active Problem List   Diagnosis Date Noted   Smoker 09/12/2019   Rhinitis medicamentosa 09/12/2019   Dysfunction of both eustachian tubes 09/12/2019   Bilateral hearing loss 09/12/2019   Hypertension 09/11/2019    Past Surgical History:  Procedure Laterality Date   NO PAST SURGERIES     none         Family History  Problem Relation Age of Onset   Gout Father    Kidney Stones Father    Colon polyps Father    Stroke Father        2/2 COVID   Arthritis Mother    Cancer Mother        Pancreactic   Colon polyps Mother    Pancreatic cancer Mother    Colon cancer Neg Hx    Diabetes Neg Hx    Kidney disease Neg Hx    Liver disease Neg Hx     Social History   Tobacco Use   Smoking status: Every Day    Packs/day: 0.10    Pack years: 0.00    Types: Cigarettes   Smokeless tobacco: Never   Tobacco comments:    smokes about 2 cigs per day  Vaping Use   Vaping Use: Never used  Substance Use Topics   Alcohol use: Not Currently    Alcohol/week: 14.0 standard drinks    Types: 14 Cans of beer per week    Comment: quit some time ago   Drug use: No    Home Medications Prior to Admission medications   Medication Sig Start Date End Date Taking? Authorizing Provider  amoxicillin (AMOXIL) 500 MG capsule Take 1 capsule (500 mg total) by mouth 3 (three) times daily. 12/22/20  Yes Isla Pence, MD   ciprofloxacin-hydrocortisone (CIPRO HC) OTIC suspension Place 3 drops into the right ear 2 (two) times daily. 12/22/20  Yes Isla Pence, MD  HYDROcodone-acetaminophen (NORCO/VICODIN) 5-325 MG tablet Take 1 tablet by mouth every 4 (four) hours as needed. 12/22/20  Yes Isla Pence, MD  amLODipine (NORVASC) 5 MG tablet TAKE 1 TABLET BY MOUTH EVERY DAY 06/19/20   Brunetta Jeans, PA-C  celecoxib (CELEBREX) 100 MG capsule TAKE 1 CAPSULE BY MOUTH TWICE A DAY 08/23/20   Dutch Quint B, FNP  gabapentin (NEURONTIN) 100 MG capsule TAKE.1 CAPSULE IN MORNING, 1 CAPSULE AT NOON AND 3 CAPSULES IN THE EVENING. 07/15/20   Brunetta Jeans, PA-C  KRILL OIL PO Take 1 capsule by mouth daily.    [provider]    Allergies    Patient has no known allergies.  Review of Systems   Review of Systems  HENT:         Right ear pain  All other systems reviewed and are negative.  Physical Exam Updated Vital Signs  BP (!) 147/94   Pulse 69   Temp 99 F (37.2 C) (Oral)   Resp 18   Ht 6\' 3"  (1.905 m)   Wt 127 kg   SpO2 97%   BMI 35.00 kg/m   Physical Exam Vitals and nursing note reviewed.  Constitutional:      Appearance: Normal appearance.  HENT:     Head: Normocephalic and atraumatic.     Right Ear: Swelling and tenderness present.     Left Ear: External ear normal.     Ears:     Comments: Unable to visualize tm due to canal swelling    Nose: Nose normal.     Mouth/Throat:     Mouth: Mucous membranes are moist.     Pharynx: Oropharynx is clear.  Eyes:     Extraocular Movements: Extraocular movements intact.     Conjunctiva/sclera: Conjunctivae normal.     Pupils: Pupils are equal, round, and reactive to light.  Cardiovascular:     Rate and Rhythm: Normal rate and regular rhythm.     Pulses: Normal pulses.     Heart sounds: Normal heart sounds.  Pulmonary:     Effort: Pulmonary effort is normal.     Breath sounds: Normal breath sounds.  Abdominal:     General: Abdomen is  flat. Bowel sounds are normal.     Palpations: Abdomen is soft.  Musculoskeletal:        General: Normal range of motion.     Cervical back: Normal range of motion and neck supple.  Skin:    General: Skin is warm.     Capillary Refill: Capillary refill takes less than 2 seconds.  Neurological:     General: No focal deficit present.     Mental Status: He is alert and oriented to person, place, and time.  Psychiatric:        Mood and Affect: Mood normal.        Behavior: Behavior normal.    ED Results / Procedures / Treatments   Labs (all labs ordered are listed, but only abnormal results are displayed) Labs Reviewed - No data to display  EKG None  Radiology No results found.  Procedures Procedures   Medications Ordered in ED Medications  amoxicillin (AMOXIL) capsule 500 mg (500 mg Oral Given 12/22/20 1316)    ED Course  I have reviewed the triage vital signs and the nursing notes.  Pertinent labs & imaging results that were available during my care of the patient were reviewed by me and considered in my medical decision making (see chart for details).    MDM Rules/Calculators/A&P                          Pt d/c with amox/cipro hc.  Pharmacy called and cipro is too expensive, so cortisporin otic ordered.  Pt is to f/u with ENT.   Return if worse.  Final Clinical Impression(s) / ED Diagnoses Final diagnoses:  Other infective acute otitis externa of right ear    Rx / DC Orders ED Discharge Orders          Ordered    amoxicillin (AMOXIL) 500 MG capsule  3 times daily        12/22/20 1311    HYDROcodone-acetaminophen (NORCO/VICODIN) 5-325 MG tablet  Every 4 hours PRN        12/22/20 1311    ciprofloxacin-hydrocortisone (CIPRO HC) OTIC suspension  2 times daily  12/22/20 [place], Dailynn Nancarrow, MD 12/22/20 1407

## 2020-12-22 NOTE — ED Triage Notes (Signed)
R ear pain since last night.

## 2020-12-26 DIAGNOSIS — H60331 Swimmer's ear, right ear: Secondary | ICD-10-CM | POA: Diagnosis not present

## 2021-01-07 DIAGNOSIS — H7291 Unspecified perforation of tympanic membrane, right ear: Secondary | ICD-10-CM | POA: Insufficient documentation

## 2021-01-07 DIAGNOSIS — J3089 Other allergic rhinitis: Secondary | ICD-10-CM | POA: Insufficient documentation

## 2021-01-07 DIAGNOSIS — R69 Illness, unspecified: Secondary | ICD-10-CM | POA: Diagnosis not present

## 2021-01-07 DIAGNOSIS — H60331 Swimmer's ear, right ear: Secondary | ICD-10-CM | POA: Diagnosis not present

## 2021-01-07 HISTORY — DX: Unspecified perforation of tympanic membrane, right ear: H72.91

## 2021-01-22 ENCOUNTER — Telehealth: Payer: Self-pay

## 2021-01-22 ENCOUNTER — Other Ambulatory Visit: Payer: Self-pay

## 2021-01-22 DIAGNOSIS — M5416 Radiculopathy, lumbar region: Secondary | ICD-10-CM

## 2021-01-22 MED ORDER — GABAPENTIN 100 MG PO CAPS: ORAL_CAPSULE | ORAL | 3 refills | Status: DC

## 2021-01-22 NOTE — Telephone Encounter (Signed)
Pt scheduled a TOC appt with Richard on 09/24 he needs refill on   gabapentin (NEURONTIN) 100 MG capsule   [id number]- [place], [place] - [address]   Pt contact [phone]

## 2021-01-22 NOTE — Telephone Encounter (Signed)
Medication sent to patient's pharmacy.

## 2021-01-29 ENCOUNTER — Other Ambulatory Visit: Payer: Self-pay

## 2021-01-29 ENCOUNTER — Encounter (HOSPITAL_BASED_OUTPATIENT_CLINIC_OR_DEPARTMENT_OTHER): Payer: Self-pay

## 2021-01-29 ENCOUNTER — Emergency Department (HOSPITAL_BASED_OUTPATIENT_CLINIC_OR_DEPARTMENT_OTHER)
Admission: EM | Admit: 2021-01-29 | Discharge: 2021-01-29 | Disposition: A | Discharge status: Home / Self Care | Payer: 59 | Attending: Emergency Medicine | Admitting: Emergency Medicine

## 2021-01-29 DIAGNOSIS — W260XXA Contact with knife, initial encounter: Secondary | ICD-10-CM | POA: Diagnosis not present

## 2021-01-29 DIAGNOSIS — F17210 Nicotine dependence, cigarettes, uncomplicated: Secondary | ICD-10-CM | POA: Diagnosis not present

## 2021-01-29 DIAGNOSIS — Z23 Encounter for immunization: Secondary | ICD-10-CM | POA: Insufficient documentation

## 2021-01-29 DIAGNOSIS — Z79899 Other long term (current) drug therapy: Secondary | ICD-10-CM | POA: Insufficient documentation

## 2021-01-29 DIAGNOSIS — I10 Essential (primary) hypertension: Secondary | ICD-10-CM | POA: Insufficient documentation

## 2021-01-29 DIAGNOSIS — S6992XA Unspecified injury of left wrist, hand and finger(s), initial encounter: Secondary | ICD-10-CM | POA: Diagnosis not present

## 2021-01-29 DIAGNOSIS — S61211A Laceration without foreign body of left index finger without damage to nail, initial encounter: Secondary | ICD-10-CM | POA: Insufficient documentation

## 2021-01-29 DIAGNOSIS — R69 Illness, unspecified: Secondary | ICD-10-CM | POA: Diagnosis not present

## 2021-01-29 MED ORDER — CEPHALEXIN 500 MG PO CAPS: 500.0000 mg | ORAL_CAPSULE | Freq: Three times a day (TID) | ORAL | 0 refills | Status: AC

## 2021-01-29 MED ORDER — TETANUS-DIPHTH-ACELL PERTUSSIS 5-2.5-18.5 LF-MCG/0.5 IM SUSY
0.5000 mL | PREFILLED_SYRINGE | Freq: Once | INTRAMUSCULAR | Status: AC
  Administered 2021-01-29: 0.5 mL via INTRAMUSCULAR
  Filled 2021-01-29: qty 0.5

## 2021-01-29 NOTE — ED Provider Notes (Signed)
[facility] Provider Note   CSN: [id number] Arrival date & time: 01/29/21  1025     History Chief Complaint  Patient presents with   Laceration    Joseph Williamson is a 50 y.o. male.  HPI     50yo male with history of arthritis, smoking, htn, presents with concern for laceration to the left index finger.  Incident occurred 45 minutes ago when he was sharpening a pencil with a knife.  It was a sharp knife and cut the distal part of his finger.  Bleeding now controled. Thought it was deep when pulling it apart.  Thinks last tetanus shot was greater than 5 years ago.  Past Medical History:  Diagnosis Date   Arthritis    Cluster headaches    History of chickenpox     Patient Active Problem List   Diagnosis Date Noted   Smoker 09/12/2019   Rhinitis medicamentosa 09/12/2019   Dysfunction of both eustachian tubes 09/12/2019   Bilateral hearing loss 09/12/2019   Hypertension 09/11/2019    Past Surgical History:  Procedure Laterality Date   NO PAST SURGERIES     none         Family History  Problem Relation Age of Onset   Gout Father    Kidney Stones Father    Colon polyps Father    Stroke Father        2/2 COVID   Arthritis Mother    Cancer Mother        Pancreactic   Colon polyps Mother    Pancreatic cancer Mother    Colon cancer Neg Hx    Diabetes Neg Hx    Kidney disease Neg Hx    Liver disease Neg Hx     Social History   Tobacco Use   Smoking status: Every Day    Packs/day: 0.10    Types: Cigarettes   Smokeless tobacco: Never   Tobacco comments:    smokes about 2 cigs per day  Vaping Use   Vaping Use: Never used  Substance Use Topics   Alcohol use: Not Currently    Alcohol/week: 14.0 standard drinks    Types: 14 Cans of beer per week    Comment: quit some time ago   Drug use: No    Home Medications Prior to Admission medications   Medication Sig Start Date End Date Taking? Authorizing Provider  cephALEXin  (KEFLEX) 500 MG capsule Take 1 capsule (500 mg total) by mouth 3 (three) times daily for 3 days. 01/29/21 02/01/21 Yes Gareth Morgan, MD  amLODipine (NORVASC) 5 MG tablet TAKE 1 TABLET BY MOUTH EVERY DAY 06/19/20   Brunetta Jeans, PA-C  amoxicillin (AMOXIL) 500 MG capsule Take 1 capsule (500 mg total) by mouth 3 (three) times daily. 12/22/20   Isla Pence, MD  celecoxib (CELEBREX) 100 MG capsule TAKE 1 CAPSULE BY MOUTH TWICE A DAY 08/23/20   Dutch Quint B, FNP  ciprofloxacin-hydrocortisone (CIPRO [facility]) OTIC suspension Place 3 drops into the right ear 2 (two) times daily. 12/22/20   Isla Pence, MD  gabapentin (NEURONTIN) 100 MG capsule TAKE.1 CAPSULE IN MORNING, 1 CAPSULE AT NOON AND 3 CAPSULES IN THE EVENING. 01/22/21   Midge Minium, MD  HYDROcodone-acetaminophen (NORCO/VICODIN) 5-325 MG tablet Take 1 tablet by mouth every 4 (four) hours as needed. 12/22/20   Isla Pence, MD  KRILL OIL PO Take 1 capsule by mouth daily.    [provider]    Allergies  Patient has no known allergies.  Review of Systems   Review of Systems  Constitutional:  Negative for fever.  Respiratory:  Negative for cough.   Gastrointestinal:  Negative for vomiting.  Musculoskeletal:  Positive for arthralgias.  Skin:  Positive for wound.   Physical Exam Updated Vital Signs BP (!) 148/94 (BP Location: Right Arm)   Pulse 63   Temp 98.1 F (36.7 C) (Oral)   Resp 18   Ht '6\' 1"'$  (1.854 m)   Wt 124.7 kg   SpO2 97%   BMI 36.28 kg/m   Physical Exam Vitals and nursing note reviewed.  Constitutional:      General: He is not in acute distress.    Appearance: Normal appearance. He is not ill-appearing, toxic-appearing or diaphoretic.  HENT:     Head: Normocephalic.  Eyes:     Conjunctiva/sclera: Conjunctivae normal.  Cardiovascular:     Rate and Rhythm: Normal rate and regular rhythm.     Pulses: Normal pulses.  Pulmonary:     Effort: Pulmonary effort is normal. No respiratory  distress.  Musculoskeletal:        General: No deformity or signs of injury.     Cervical back: No rigidity.     Comments: Radial side left index finger laterally with 1.5cm laceration, not gaping, approximated, superficial with tension, no signs of FB, normal cap refill and sensation, normal flexion and extension  Skin:    General: Skin is warm and dry.     Coloration: Skin is not jaundiced or pale.  Neurological:     General: No focal deficit present.     Mental Status: He is alert and oriented to person, place, and time.    ED Results / Procedures / Treatments   Labs (all labs ordered are listed, but only abnormal results are displayed) Labs Reviewed - No data to display  EKG None  Radiology No results found.  Procedures Procedures   Medications Ordered in ED Medications  Tdap (BOOSTRIX) injection 0.5 mL (0.5 mLs Intramuscular Given 01/29/21 1047)    ED Course  I have reviewed the triage vital signs and the nursing notes.  Pertinent labs & imaging results that were available during my care of the patient were reviewed by me and considered in my medical decision making (see chart for details).    MDM Rules/Calculators/A&P                            50yo male with history of arthritis, smoking, htn, presents with concern for laceration to the left index finger.  Do not suspect fracture or FB by hx and exam. NV intact.  TDap updated. Wound irrigated and closed with dermabond given short, superficial nature.  Will give finger splint for support/protection, empiric abx for 3 days.     Final Clinical Impression(s) / ED Diagnoses Final diagnoses:  Laceration of left index finger without foreign body without damage to nail, initial encounter    Rx / DC Orders ED Discharge Orders          Ordered    cephALEXin (KEFLEX) 500 MG capsule  3 times daily        01/29/21 1102             Gareth Morgan, MD 01/29/21 1103

## 2021-01-29 NOTE — ED Triage Notes (Signed)
Pt to er, pt states that he was sharpening his pencil with his knife and he cut his L pointer finger, bleeding is controlled/ stopped at this time

## 2021-03-18 ENCOUNTER — Other Ambulatory Visit: Payer: Self-pay

## 2021-03-18 DIAGNOSIS — I10 Essential (primary) hypertension: Secondary | ICD-10-CM

## 2021-03-18 MED ORDER — AMLODIPINE BESYLATE 5 MG PO TABS: 5.0000 mg | ORAL_TABLET | Freq: Every day | ORAL | 0 refills | Status: DC

## 2021-03-20 ENCOUNTER — Encounter: Payer: Self-pay | Admitting: Registered Nurse

## 2021-03-20 ENCOUNTER — Ambulatory Visit (INDEPENDENT_AMBULATORY_CARE_PROVIDER_SITE_OTHER): Payer: 59 | Admitting: Registered Nurse

## 2021-03-20 ENCOUNTER — Other Ambulatory Visit: Payer: Self-pay

## 2021-03-20 VITALS — BP 125/85 | HR 62 | Temp 98.3°F | Resp 18 | Ht 75.0 in | Wt 279.8 lb

## 2021-03-20 DIAGNOSIS — I10 Essential (primary) hypertension: Secondary | ICD-10-CM

## 2021-03-20 DIAGNOSIS — D229 Melanocytic nevi, unspecified: Secondary | ICD-10-CM

## 2021-03-20 DIAGNOSIS — E669 Obesity, unspecified: Secondary | ICD-10-CM

## 2021-03-20 DIAGNOSIS — Z720 Tobacco use: Secondary | ICD-10-CM | POA: Diagnosis not present

## 2021-03-20 DIAGNOSIS — R7989 Other specified abnormal findings of blood chemistry: Secondary | ICD-10-CM | POA: Diagnosis not present

## 2021-03-20 LAB — CBC WITH DIFFERENTIAL/PLATELET
Basophils Absolute: 0.1 10*3/uL (ref 0.0–0.1)
Basophils Relative: 0.7 % (ref 0.0–3.0)
Eosinophils Absolute: 0.5 10*3/uL (ref 0.0–0.7)
Eosinophils Relative: 5.1 % — ABNORMAL HIGH (ref 0.0–5.0)
HCT: 44.3 % (ref 39.0–52.0)
Hemoglobin: 15.1 g/dL (ref 13.0–17.0)
Lymphocytes Relative: 34.2 % (ref 12.0–46.0)
Lymphs Abs: 3.5 10*3/uL (ref 0.7–4.0)
MCHC: 34.2 g/dL (ref 30.0–36.0)
MCV: 87.8 fl (ref 78.0–100.0)
Monocytes Absolute: 1 10*3/uL (ref 0.1–1.0)
Monocytes Relative: 9.5 % (ref 3.0–12.0)
Neutro Abs: 5.2 10*3/uL (ref 1.4–7.7)
Neutrophils Relative %: 50.5 % (ref 43.0–77.0)
Platelets: 217 10*3/uL (ref 150.0–400.0)
RBC: 5.04 Mil/uL (ref 4.22–5.81)
RDW: 13.5 % (ref 11.5–15.5)
WBC: 10.4 10*3/uL (ref 4.0–10.5)

## 2021-03-20 LAB — COMPREHENSIVE METABOLIC PANEL
ALT: 55 U/L — ABNORMAL HIGH (ref 0–53)
AST: 28 U/L (ref 0–37)
Albumin: 4.5 g/dL (ref 3.5–5.2)
Alkaline Phosphatase: 82 U/L (ref 39–117)
BUN: 19 mg/dL (ref 6–23)
CO2: 27 mEq/L (ref 19–32)
Calcium: 9.9 mg/dL (ref 8.4–10.5)
Chloride: 104 mEq/L (ref 96–112)
Creatinine, Ser: 1.01 mg/dL (ref 0.40–1.50)
GFR: 87.99 mL/min (ref >60.00)
Glucose, Bld: 77 mg/dL (ref 70–99)
Potassium: 4.3 mEq/L (ref 3.5–5.1)
Sodium: 139 mEq/L (ref 135–145)
Total Bilirubin: 0.4 mg/dL (ref 0.2–1.2)
Total Protein: 7.2 g/dL (ref 6.0–8.3)

## 2021-03-20 LAB — LIPID PANEL
Cholesterol: 214 mg/dL — ABNORMAL HIGH (ref 0–200)
HDL: 36.3 mg/dL — ABNORMAL LOW (ref >39.00)
NonHDL: 178.11
Total CHOL/HDL Ratio: 6
Triglycerides: 232 mg/dL — ABNORMAL HIGH (ref 0.0–149.0)
VLDL: 46.4 mg/dL — ABNORMAL HIGH (ref 0.0–40.0)

## 2021-03-20 LAB — HEMOGLOBIN A1C: Hgb A1c MFr Bld: 6 % (ref 4.6–6.5)

## 2021-03-20 LAB — TSH: TSH: 2.08 u[IU]/mL (ref 0.35–5.50)

## 2021-03-20 LAB — LDL CHOLESTEROL, DIRECT: Direct LDL: 159 mg/dL

## 2021-03-20 MED ORDER — BUPROPION HCL ER (XL) 150 MG PO TB24: 150.0000 mg | ORAL_TABLET | Freq: Every day | ORAL | 0 refills | Status: DC

## 2021-03-20 MED ORDER — BUPROPION HCL ER (XL) 300 MG PO TB24: 300.0000 mg | ORAL_TABLET | Freq: Every day | ORAL | 0 refills | Status: DC

## 2021-03-20 NOTE — Patient Instructions (Signed)
Mr. Caspers -   Doristine Devoid to meet you. In brief:  Start wellbutrin 150mg  once daily. If doing well after 1 week, increase to twice daily. If doing well after that, can go up to 300mg  daily. I have sent a longer rx for each to make it easy to walk back down to 150 if you'd like. Should be cheap through insurance. Referral to dermatology placed - they'll call you in a couple of weeks to set up an appt. Can consider adding valsartan for BP and headaches should they recur Labs today will be back this afternoon. I'll call with any urgent concerns. See you in 6 months for a physical and repeat labs   Call in the mean time if you need anything!  Rich

## 2021-03-20 NOTE — Progress Notes (Signed)
Established Patient Office Visit  Subjective:  Patient ID: Joseph Williamson, male    DOB: 1970/07/01  Age: 50 y.o. MRN: [id number]  CC:  Chief Complaint  Patient presents with   Transitions Of Care    Patient states he is her for a TOC. Patient would like to discuss colonoscopy and bp     HPI Joseph Williamson presents for visit to est care  Histories reviewed and updated with patient.   Cluster headaches - no issues in 2 years. Has O2 that he uses when onset. No acute concerns  Arthritis - stable  Tobacco abuse - smokes 2-3 cigarettes a day. Down from 1 ppd. Intereted in quitting. Interested in wellbutrin.  Hypertension: Patient Currently taking: amodipine 5mg  po qd.  Good effect. No AEs. Denies CV symptoms including: chest pain, shob, doe, headache, visual changes, fatigue, claudication, and dependent edema.   Previous readings and labs: BP Readings from Last 3 Encounters:  03/20/21 125/85  01/29/21 (!) 154/110  12/22/20 (!) 147/94   Lab Results  Component Value Date   CREATININE 0.92 09/11/2019   Colon polyps: colonoscopy in 2015 with recommended 10 year recall.   Multiple nevi: none new or changing. Multiple skin tags. One lesion on leg of note - it is bumpy, mildly raised compared to others. Lots of sun exposure during early adulthood, no sun block. Notes he takes better care of skin now.   Otherwise no concerns.   Past Medical History:  Diagnosis Date   Arthritis    Back pain    Cluster headaches    History of chickenpox    Hypertension     Past Surgical History:  Procedure Laterality Date   NO PAST SURGERIES     none      Family History  Problem Relation Age of Onset   Arthritis Mother    Cancer Mother        Pancreactic   Colon polyps Mother    Pancreatic cancer Mother    Gout Father    Kidney Stones Father    Colon polyps Father    Stroke Father        2/2 COVID   Colon cancer Neg Hx    Diabetes Neg Hx    Kidney disease Neg Hx    Liver  disease Neg Hx     Social History   Socioeconomic History   Marital status: Married    Spouse name: Not on file   Number of children: Not on file   Years of education: Not on file   Highest education level: Not on file  Occupational History   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[employer]  Tobacco Use   Smoking status: Every Day    Packs/day: 0.10    Types: Cigarettes   Smokeless tobacco: Never   Tobacco comments:    smokes about 2 cigs per day  Vaping Use   Vaping Use: Never used  Substance and Sexual Activity   Alcohol use: Not Currently    Alcohol/week: 14.0 standard drinks    Types: 14 Cans of beer per week    Comment: quit some time ago   Drug use: No   Sexual activity: Yes  Other Topics Concern   Not on file  Social History Narrative   Not on file   Social Determinants of Health   Financial Resource Strain: Not on file  Food Insecurity: Not on file  Transportation Needs: Not on file  Physical  Activity: Not on file  Stress: Not on file  Social Connections: Not on file  Intimate Partner Violence: Not on file    Outpatient Medications Prior to Visit  Medication Sig Dispense Refill   amLODipine (NORVASC) 5 MG tablet Take 1 tablet (5 mg total) by mouth daily. 90 tablet 0   amoxicillin (AMOXIL) 500 MG capsule Take 1 capsule (500 mg total) by mouth 3 (three) times daily. 21 capsule 0   celecoxib (CELEBREX) 100 MG capsule TAKE 1 CAPSULE BY MOUTH TWICE A DAY 180 capsule 0   ciprofloxacin-hydrocortisone (CIPRO HC) OTIC suspension Place 3 drops into the right ear 2 (two) times daily. 10 mL 0   gabapentin (NEURONTIN) 100 MG capsule TAKE.1 CAPSULE IN MORNING, 1 CAPSULE AT NOON AND 3 CAPSULES IN THE EVENING. 150 capsule 3   HYDROcodone-acetaminophen (NORCO/VICODIN) 5-325 MG tablet Take 1 tablet by mouth every 4 (four) hours as needed. 10 tablet 0   KRILL OIL PO Take 1 capsule by mouth daily.     No facility-administered medications prior to visit.    No Known  Allergies  ROS Review of Systems  Constitutional: Negative.   HENT: Negative.    Eyes: Negative.   Respiratory: Negative.    Cardiovascular: Negative.   Gastrointestinal: Negative.   Endocrine: Negative.   Genitourinary: Negative.   Musculoskeletal: Negative.   Skin: Negative.   Allergic/Immunologic: Negative.   Neurological: Negative.   Hematological: Negative.   Psychiatric/Behavioral: Negative.    All other systems reviewed and are negative.    Objective:    Physical Exam Constitutional:      General: He is not in acute distress.    Appearance: Normal appearance. He is normal weight. He is not ill-appearing, toxic-appearing or diaphoretic.  Cardiovascular:     Rate and Rhythm: Normal rate and regular rhythm.     Heart sounds: Normal heart sounds. No murmur heard.   No friction rub. No gallop.  Pulmonary:     Effort: Pulmonary effort is normal. No respiratory distress.     Breath sounds: Normal breath sounds. No stridor. No wheezing, rhonchi or rales.  Chest:     Chest wall: No tenderness.  Skin:    Findings: Lesion (benign appearing nevus on back of left calf. multiple skin tags in flexural areas.) present.  Neurological:     General: No focal deficit present.     Mental Status: He is alert and oriented to person, place, and time. Mental status is at baseline.  Psychiatric:        Mood and Affect: Mood normal.        Behavior: Behavior normal.        Thought Content: Thought content normal.        Judgment: Judgment normal.    BP 125/85   Pulse 62   Temp 98.3 F (36.8 C) (Temporal)   Resp 18   Ht 6\' 3"  (1.905 m)   Wt 279 lb 12.8 oz (126.9 kg)   BMI 34.97 kg/m  Wt Readings from Last 3 Encounters:  03/20/21 279 lb 12.8 oz (126.9 kg)  01/29/21 275 lb (124.7 kg)  12/22/20 280 lb (127 kg)     There are no preventive care reminders to display for this patient.  There are no preventive care reminders to display for this patient.  No results found for:  TSH Lab Results  Component Value Date   WBC 9.7 09/11/2019   HGB 15.4 09/11/2019   HCT 45.2 09/11/2019   MCV 89.3  09/11/2019   PLT 196.0 09/11/2019   Lab Results  Component Value Date   NA 140 09/11/2019   K 4.7 09/11/2019   CO2 29 09/11/2019   GLUCOSE 90 09/11/2019   BUN 16 09/11/2019   CREATININE 0.92 09/11/2019   BILITOT 0.3 12/27/2019   ALKPHOS 99 12/27/2019   AST 32 12/27/2019   ALT 75 (H) 12/27/2019   PROT 6.9 12/27/2019   ALBUMIN 4.5 12/27/2019   CALCIUM 9.9 09/11/2019   GFR 88.22 09/11/2019   Lab Results  Component Value Date   CHOL 221 (H) 09/11/2019   Lab Results  Component Value Date   HDL 39.00 (L) 09/11/2019   Lab Results  Component Value Date   LDLCALC 157 (H) 09/11/2019   Lab Results  Component Value Date   TRIG 129.0 09/11/2019   Lab Results  Component Value Date   CHOLHDL 6 09/11/2019   Lab Results  Component Value Date   HGBA1C 5.7 09/11/2019      Assessment & Plan:   Problem List Items Addressed This Visit       Cardiovascular and Mediastinum   Hypertension - Primary   Relevant Orders   CBC with Differential/Platelet   Comprehensive metabolic panel   Other Visit Diagnoses     Elevated LFTs       Relevant Orders   Comprehensive metabolic panel   Obesity (BMI 30-39.9)       Relevant Orders   Lipid panel   TSH   Hemoglobin A1c   Multiple nevi       Relevant Orders   Ambulatory referral to Dermatology   Tobacco abuse       Relevant Medications   buPROPion (WELLBUTRIN XL) 150 MG 24 hr tablet   buPROPion (WELLBUTRIN XL) 300 MG 24 hr tablet       Meds ordered this encounter  Medications   buPROPion (WELLBUTRIN XL) 150 MG 24 hr tablet    Sig: Take 1 tablet (150 mg total) by mouth daily.    Dispense:  90 tablet    Refill:  0    Order Specific Question:   Supervising Provider    Answer:   Carlota Raspberry, JEFFREY R [[id number]]   buPROPion (WELLBUTRIN XL) 300 MG 24 hr tablet    Sig: Take 1 tablet (300 mg total) by mouth daily.     Dispense:  90 tablet    Refill:  0    Order Specific Question:   Supervising Provider    Answer:   Carlota Raspberry, JEFFREY R [[id number]]    Follow-up: Return in about 6 months (around 09/17/2021) for CPE and labs.   PLAN Discussed htn. Bp wnl at this time. Next step would be likely combo valsartan-amlodipine given his hx of headaches. Reviewed nonpharm for bp. Multiple nevi and skin tags: discussed removal vs referral. Pt opts for referral to derm - will place. Has been 20+ years since seeing specialist. Reviewed signs of melanoma (ABCDE) Discussed smoking cessation. Reviewed risks, benefits, alternatives to wellbutrin. Will proceed - start at 150mg  po qd for one week, then bid for one week, then 300mg  po qd. Will follow up in a few weeks to monitor effect Reviewed past labs. Will collect routine labs today. Return in 6 mo for CPE and labs  I spent 44 minutes with this patient coordinating medication refills, a new medication, discussing referral, reviewing past labs and ordering new ones, planning follow up, and discussing healthy lifestyle.   Maximiano Coss, NP

## 2021-04-01 ENCOUNTER — Encounter: Payer: Self-pay | Admitting: Registered Nurse

## 2021-04-02 ENCOUNTER — Other Ambulatory Visit: Payer: Self-pay

## 2021-04-02 DIAGNOSIS — M19041 Primary osteoarthritis, right hand: Secondary | ICD-10-CM

## 2021-04-02 MED ORDER — CELECOXIB 100 MG PO CAPS: 100.0000 mg | ORAL_CAPSULE | Freq: Two times a day (BID) | ORAL | 0 refills | Status: DC

## 2021-04-17 ENCOUNTER — Encounter: Payer: Self-pay | Admitting: Family Medicine

## 2021-04-17 ENCOUNTER — Other Ambulatory Visit: Payer: Self-pay

## 2021-04-17 ENCOUNTER — Telehealth: Payer: Self-pay | Admitting: Registered Nurse

## 2021-04-17 ENCOUNTER — Telehealth (INDEPENDENT_AMBULATORY_CARE_PROVIDER_SITE_OTHER): Payer: 59 | Admitting: Family Medicine

## 2021-04-17 VITALS — Temp 101.0°F

## 2021-04-17 DIAGNOSIS — R509 Fever, unspecified: Secondary | ICD-10-CM

## 2021-04-17 DIAGNOSIS — R059 Cough, unspecified: Secondary | ICD-10-CM | POA: Diagnosis not present

## 2021-04-17 MED ORDER — BENZONATATE 100 MG PO CAPS: 100.0000 mg | ORAL_CAPSULE | Freq: Three times a day (TID) | ORAL | 0 refills | Status: DC | PRN

## 2021-04-17 NOTE — Telephone Encounter (Signed)
..  Caller name:Joseph Williamson  On DPR? :yes/no: Yes  Call back number:  [phone]  Provider they see: Carlota Raspberry  Reason for call: Patient tested for covid and test was negative

## 2021-04-17 NOTE — Progress Notes (Addendum)
Virtual Visit via Video Note  I connected with [name] on 04/17/21 at 12:18 PM by a video enabled telemedicine application and verified that I am speaking with the correct person using two identifiers.  Patient location: home - by self My location: office - [place].    I discussed the limitations, risks, security and privacy concerns of performing an evaluation and management service by telephone and the availability of in person appointments. I also discussed with the patient that there may be a patient responsible charge related to this service. The patient expressed understanding and agreed to proceed, consent obtained  Chief complaint:  Chief Complaint  Patient presents with   Nasal Congestion    Pt reports congestion, low grade fever, body aches, fatigue, pt reports sxs starting Sunday, Negative COVID on Tuesday evening , pt states has had COVID 1 shot J&J   History of Present Illness: Joseph Williamson is a 50 y.o. male  Fever, congestion Started 4 days ago with fatigue, body aches, congestion - body aches worse 3 days ago and again 2 days ago. Better in am yesterday, then tired at end of day.  Still feels bad today with congestion, cough, no energy.  No dyspnea, confusion. Drinking fluids sufficiently, no chest pains. Fever 101 2 days ago - temp normal this am -98.7. no fever in past 2 days that he knows of.  No known sick contacts.  Covid test negative 2 days ago (2 days after symptoms started). Plans repeat test after call.   Tx: mucinex DM, nyquil.   Covid vaccine - had J and J vaccine in fall 2021, no booster.  Covid risk of complication score of 3.  Has not had flu vaccine.   Patient Active Problem List   Diagnosis Date Noted   Smoker 09/12/2019   Rhinitis medicamentosa 09/12/2019   Dysfunction of both eustachian tubes 09/12/2019   Bilateral hearing loss 09/12/2019   Hypertension 09/11/2019   Past Medical History:  Diagnosis Date   Arthritis    Back  pain    Cluster headaches    History of chickenpox    Hypertension    Past Surgical History:  Procedure Laterality Date   NO PAST SURGERIES     none     No Known Allergies Prior to Admission medications   Medication Sig Start Date End Date Taking? Authorizing Provider  amLODipine (NORVASC) 5 MG tablet Take 1 tablet (5 mg total) by mouth daily. 03/18/21  Yes Dutch Quint B, FNP  buPROPion (WELLBUTRIN XL) 150 MG 24 hr tablet Take 1 tablet (150 mg total) by mouth daily. 03/20/21  Yes Maximiano Coss, NP  buPROPion (WELLBUTRIN XL) 300 MG 24 hr tablet Take 1 tablet (300 mg total) by mouth daily. 03/20/21  Yes Maximiano Coss, NP  celecoxib (CELEBREX) 100 MG capsule Take 1 capsule (100 mg total) by mouth 2 (two) times daily. 04/02/21  Yes Maximiano Coss, NP  gabapentin (NEURONTIN) 100 MG capsule TAKE.1 CAPSULE IN MORNING, 1 CAPSULE AT NOON AND 3 CAPSULES IN THE EVENING. 01/22/21  Yes Midge Minium, MD  KRILL OIL PO Take 1 capsule by mouth daily.   Yes [provider]   Social History   Socioeconomic History   Marital status: Married    Spouse name: Not on file   Number of children: Not on file   Years of education: Not on file   Highest education level: Not on file  Occupational History   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[employer]  Tobacco Use   Smoking status: Every Day    Packs/day: 0.10    Types: Cigarettes   Smokeless tobacco: Never   Tobacco comments:    smokes about 2 cigs per day  Vaping Use   Vaping Use: Never used  Substance and Sexual Activity   Alcohol use: Not Currently    Alcohol/week: 14.0 standard drinks    Types: 14 Cans of beer per week    Comment: quit some time ago   Drug use: No   Sexual activity: Yes  Other Topics Concern   Not on file  Social History Narrative   Not on file   Social Determinants of Health   Financial Resource Strain: Not on file  Food Insecurity: Not on file  Transportation Needs: Not on file  Physical  Activity: Not on file  Stress: Not on file  Social Connections: Not on file  Intimate Partner Violence: Not on file    Observations/Objective: Vitals:   04/17/21 1131  Temp: (!) 101 F (38.3 C)  Vitals from 2 days ago - temp 101. 98.7 this am.  Nontoxic appearance on video.  Appropriate responses, no respiratory distress.  All questions were answered with understanding plan expressed.  Euthymic mood.  Assessment and Plan: Cough, unspecified type - Plan: benzonatate (TESSALON) 100 MG capsule  Fever, unspecified fever cause - Plan: benzonatate (TESSALON) 100 MG capsule Suspected viral infection, encouraging his initial COVID test was negative, but that was 2 days into symptoms, possible false negative.  Plan for repeat testing today.  Differential also includes influenza, but now 4 to 5 days into symptoms.  No red flags on exam or history at this time.    If positive COVID test he will call me today and we can discuss antivirals but those will need to be started today  -Other symptomatic care discussed with continue Mucinex for cough, congestion, Tylenol or Motrin for fever or headache, drinking plenty fluids and rest.  Tessalon Perles prescribed for cough.  ER/urgent care precautions given if worsening symptoms including dyspnea, worsening fever/return of fever, or otherwise worsening.  Understanding expressed.  Follow Up Instructions: As needed, urgent care and ER precautions as above.   I discussed the assessment and treatment plan with the patient. The patient was provided an opportunity to ask questions and all were answered. The patient agreed with the plan and demonstrated an understanding of the instructions.   The patient was advised to call back or seek an in-person evaluation if the symptoms worsen or if the condition fails to improve as anticipated.  Wendie Agreste, MD   1:40 PM Update per phone call - repeat covid test negative.

## 2021-05-29 ENCOUNTER — Encounter: Payer: Self-pay | Admitting: Family Medicine

## 2021-06-03 ENCOUNTER — Encounter: Payer: Self-pay | Admitting: Registered Nurse

## 2021-06-03 ENCOUNTER — Ambulatory Visit (INDEPENDENT_AMBULATORY_CARE_PROVIDER_SITE_OTHER): Payer: 59 | Admitting: Registered Nurse

## 2021-06-03 ENCOUNTER — Other Ambulatory Visit: Payer: Self-pay

## 2021-06-03 VITALS — BP 143/90 | HR 71 | Temp 98.4°F | Resp 18 | Ht 75.0 in | Wt 281.6 lb

## 2021-06-03 DIAGNOSIS — R059 Cough, unspecified: Secondary | ICD-10-CM

## 2021-06-03 DIAGNOSIS — J22 Unspecified acute lower respiratory infection: Secondary | ICD-10-CM

## 2021-06-03 DIAGNOSIS — R509 Fever, unspecified: Secondary | ICD-10-CM

## 2021-06-03 MED ORDER — DOXYCYCLINE HYCLATE 100 MG PO TABS
100.0000 mg | ORAL_TABLET | Freq: Two times a day (BID) | ORAL | 0 refills | Status: DC
Start: 2021-06-03 — End: 2021-09-17

## 2021-06-03 MED ORDER — PREDNISONE 10 MG (21) PO TBPK: ORAL_TABLET | ORAL | 0 refills | Status: DC

## 2021-06-03 MED ORDER — BENZONATATE 100 MG PO CAPS: 100.0000 mg | ORAL_CAPSULE | Freq: Three times a day (TID) | ORAL | 0 refills | Status: DC | PRN

## 2021-06-03 NOTE — Progress Notes (Signed)
Established Patient Office Visit  Subjective:  Patient ID: Joseph Williamson, male    DOB: [birth year]  Age: 50 y.o. MRN: [id number]  CC:  Chief Complaint  Patient presents with   Follow-up    Patient states 4 weeks ago he had a virtual w/ Dr. Carlota Raspberry patient states he believes he still have upper respiratory problems.    HPI [name] presents for ongoing cough  Seen by Dr. Carlota Raspberry for VV on 04/17/21 Suspected to be viral infection. Given tessalon. Negative covid tests x 2 at that time.  Initially felt like he was improving, unfortunately has worsened against since. Ongoing cough, congestion. Fatigue, malaise. Low grade temp  No nvd, shob, doe, headaches, chest pain  Past Medical History:  Diagnosis Date   Arthritis    Back pain    Cluster headaches    History of chickenpox    Hypertension     Past Surgical History:  Procedure Laterality Date   NO PAST SURGERIES     none      Family History  Problem Relation Age of Onset   Arthritis Mother    Cancer Mother        Pancreactic   Colon polyps Mother    Pancreatic cancer Mother    Gout Father    Kidney Stones Father    Colon polyps Father    Stroke Father        2/2 COVID   Colon cancer Neg Hx    Diabetes Neg Hx    Kidney disease Neg Hx    Liver disease Neg Hx     Social History   Socioeconomic History   Marital status: Married    Spouse name: Not on file   Number of children: Not on file   Years of education: Not on file   Highest education level: Not on file  Occupational History   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[employer]  Tobacco Use   Smoking status: Every Day    Packs/day: 0.10    Types: Cigarettes   Smokeless tobacco: Never   Tobacco comments:    smokes about 2 cigs per day  Vaping Use   Vaping Use: Never used  Substance and Sexual Activity   Alcohol use: Not Currently    Alcohol/week: 14.0 standard drinks    Types: 14 Cans of beer per week    Comment: quit some time  ago   Drug use: No   Sexual activity: Yes  Other Topics Concern   Not on file  Social History Narrative   Not on file   Social Determinants of Health   Financial Resource Strain: Not on file  Food Insecurity: Not on file  Transportation Needs: Not on file  Physical Activity: Not on file  Stress: Not on file  Social Connections: Not on file  Intimate Partner Violence: Not on file    Outpatient Medications Prior to Visit  Medication Sig Dispense Refill   amLODipine (NORVASC) 5 MG tablet Take 1 tablet (5 mg total) by mouth daily. 90 tablet 0   buPROPion (WELLBUTRIN XL) 150 MG 24 hr tablet Take 1 tablet (150 mg total) by mouth daily. 90 tablet 0   buPROPion (WELLBUTRIN XL) 300 MG 24 hr tablet Take 1 tablet (300 mg total) by mouth daily. 90 tablet 0   celecoxib (CELEBREX) 100 MG capsule Take 1 capsule (100 mg total) by mouth 2 (two) times daily. 180 capsule 0   gabapentin (NEURONTIN) 100  MG capsule TAKE.1 CAPSULE IN MORNING, 1 CAPSULE AT NOON AND 3 CAPSULES IN THE EVENING. 150 capsule 3   KRILL OIL PO Take 1 capsule by mouth daily.     benzonatate (TESSALON) 100 MG capsule Take 1 capsule (100 mg total) by mouth 3 (three) times daily as needed for cough. 20 capsule 0   No facility-administered medications prior to visit.    No Known Allergies  ROS Review of Systems Per hpi     Objective:    Physical Exam Constitutional:      General: He is not in acute distress.    Appearance: Normal appearance. He is normal weight. He is not ill-appearing, toxic-appearing or diaphoretic.  Cardiovascular:     Rate and Rhythm: Normal rate and regular rhythm.     Heart sounds: Normal heart sounds. No murmur heard.   No friction rub. No gallop.  Pulmonary:     Effort: Pulmonary effort is normal. No respiratory distress.     Breath sounds: No stridor. Wheezing (throughout) present. No rhonchi or rales.     Comments: Mild atelectasis in R lower lobe Chest:     Chest wall: No tenderness.   Neurological:     General: No focal deficit present.     Mental Status: He is alert and oriented to person, place, and time. Mental status is at baseline.  Psychiatric:        Mood and Affect: Mood normal.        Behavior: Behavior normal.        Thought Content: Thought content normal.        Judgment: Judgment normal.    BP (!) 143/90   Pulse 71   Temp 98.4 F (36.9 C) (Temporal)   Resp 18   Ht 6\' 3"  (1.905 m)   Wt 281 lb 9.6 oz (127.7 kg)   BMI 35.20 kg/m  Wt Readings from Last 3 Encounters:  06/03/21 281 lb 9.6 oz (127.7 kg)  03/20/21 279 lb 12.8 oz (126.9 kg)  01/29/21 275 lb (124.7 kg)     Health Maintenance Due  Topic Date Due   Pneumococcal Vaccine 57-55 Years old (1 - PCV) Never done   COVID-19 Vaccine (3 - Booster for Pfizer series) 05/30/2020    There are no preventive care reminders to display for this patient.  Lab Results  Component Value Date   TSH 2.08 03/20/2021   Lab Results  Component Value Date   WBC 10.4 03/20/2021   HGB 15.1 03/20/2021   HCT 44.3 03/20/2021   MCV 87.8 03/20/2021   PLT 217.0 03/20/2021   Lab Results  Component Value Date   NA 139 03/20/2021   K 4.3 03/20/2021   CO2 27 03/20/2021   GLUCOSE 77 03/20/2021   BUN 19 03/20/2021   CREATININE 1.01 03/20/2021   BILITOT 0.4 03/20/2021   ALKPHOS 82 03/20/2021   AST 28 03/20/2021   ALT 55 (H) 03/20/2021   PROT 7.2 03/20/2021   ALBUMIN 4.5 03/20/2021   CALCIUM 9.9 03/20/2021   GFR 87.99 03/20/2021   Lab Results  Component Value Date   CHOL 214 (H) 03/20/2021   Lab Results  Component Value Date   HDL 36.30 (L) 03/20/2021   Lab Results  Component Value Date   LDLCALC 157 (H) 09/11/2019   Lab Results  Component Value Date   TRIG 232.0 (H) 03/20/2021   Lab Results  Component Value Date   CHOLHDL 6 03/20/2021   Lab Results  Component Value  Date   HGBA1C 6.0 03/20/2021      Assessment & Plan:   Problem List Items Addressed This Visit   None Visit  Diagnoses     Lower respiratory infection    -  Primary   Relevant Medications   doxycycline (VIBRA-TABS) 100 MG tablet   predniSONE (STERAPRED UNI-PAK 21 TAB) 10 MG (21) TBPK tablet   benzonatate (TESSALON) 100 MG capsule   Cough, unspecified type       Relevant Medications   benzonatate (TESSALON) 100 MG capsule   Fever, unspecified fever cause       Relevant Medications   benzonatate (TESSALON) 100 MG capsule       Meds ordered this encounter  Medications   doxycycline (VIBRA-TABS) 100 MG tablet    Sig: Take 1 tablet (100 mg total) by mouth 2 (two) times daily.    Dispense:  20 tablet    Refill:  0    Order Specific Question:   Supervising Provider    Answer:   Carlota Raspberry, JEFFREY R [[id number]]   predniSONE (STERAPRED UNI-PAK 21 TAB) 10 MG (21) TBPK tablet    Sig: Take per package instructions. Do not skip doses. Finish entire supply.    Dispense:  1 each    Refill:  0    Order Specific Question:   Supervising Provider    Answer:   Carlota Raspberry, JEFFREY R [[id number]]   benzonatate (TESSALON) 100 MG capsule    Sig: Take 1 capsule (100 mg total) by mouth 3 (three) times daily as needed for cough.    Dispense:  20 capsule    Refill:  0    Order Specific Question:   Supervising Provider    Answer:   Carlota Raspberry, JEFFREY R [[id number]]    Follow-up: Return if symptoms worsen or fail to improve.   PLAN Concern for bacterial infection. Tx with doxycycline as above Supportive care with tessalon and prednisone taper Reviewed ER precautions with pt who voices understanding Patient encouraged to call clinic with any questions, comments, or concerns.  Maximiano Coss, NP

## 2021-06-03 NOTE — Patient Instructions (Signed)
Mr. Streight -   Doristine Devoid to see you, sorry you're not feeling well  Doxycycline twice daily for 10 days - finish entire course even if you're feeling better.  Prednisone - take per package instructions - finish entire course  Use tessalon as needed for cough  Call Friday if worsening or failing to improve  Thank you  Rich

## 2021-06-13 ENCOUNTER — Other Ambulatory Visit: Payer: Self-pay | Admitting: Family

## 2021-06-13 DIAGNOSIS — I10 Essential (primary) hypertension: Secondary | ICD-10-CM

## 2021-06-19 ENCOUNTER — Other Ambulatory Visit: Payer: Self-pay | Admitting: Family Medicine

## 2021-06-19 DIAGNOSIS — M5416 Radiculopathy, lumbar region: Secondary | ICD-10-CM

## 2021-06-29 ENCOUNTER — Other Ambulatory Visit: Payer: Self-pay | Admitting: Registered Nurse

## 2021-06-29 DIAGNOSIS — M19041 Primary osteoarthritis, right hand: Secondary | ICD-10-CM

## 2021-07-01 DIAGNOSIS — H6504 Acute serous otitis media, recurrent, right ear: Secondary | ICD-10-CM

## 2021-07-01 DIAGNOSIS — H7291 Unspecified perforation of tympanic membrane, right ear: Secondary | ICD-10-CM | POA: Diagnosis not present

## 2021-07-01 HISTORY — DX: Acute serous otitis media, recurrent, right ear: H65.04

## 2021-07-11 ENCOUNTER — Encounter: Payer: Self-pay | Admitting: Family

## 2021-07-11 ENCOUNTER — Other Ambulatory Visit: Payer: Self-pay

## 2021-07-11 ENCOUNTER — Encounter: Payer: Self-pay | Admitting: Registered Nurse

## 2021-07-11 ENCOUNTER — Telehealth (INDEPENDENT_AMBULATORY_CARE_PROVIDER_SITE_OTHER): Payer: 59 | Admitting: Family

## 2021-07-11 DIAGNOSIS — U071 COVID-19: Secondary | ICD-10-CM | POA: Diagnosis not present

## 2021-07-11 HISTORY — DX: COVID-19: U07.1

## 2021-07-11 MED ORDER — NIRMATRELVIR/RITONAVIR (PAXLOVID)TABLET: 3.0000 | ORAL_TABLET | Freq: Two times a day (BID) | ORAL | 0 refills | Status: AC

## 2021-07-11 NOTE — Progress Notes (Signed)
MyChart Video Visit    Virtual Visit via Video Note   This visit type was conducted due to national recommendations for restrictions regarding the COVID-19 Pandemic (e.g. social distancing) in an effort to limit this patient's exposure and mitigate transmission in our community. This patient is at least at moderate risk for complications without adequate follow up. This format is felt to be most appropriate for this patient at this time. Physical exam was limited by quality of the video and audio technology used for the visit. CMA was able to get the patient set up on a video visit.  Patient location: Home. Patient and provider in visit Provider location: Office  I discussed the limitations of evaluation and management by telemedicine and the availability of in person appointments. The patient expressed understanding and agreed to proceed.  Visit Date: 07/11/2021  Today's healthcare provider: Jeanie Sewer, NP     Subjective:    Patient ID: Joseph Williamson, male    DOB: 1971/06/23, 51 y.o.   MRN: [id number]  Chief Complaint  Patient presents with   Covid Positive    Tested positive this morning. Symptoms started yesterday. He has taken Robitussin, but symptoms have not subsided. He denies CP, SOB.   Headache   Nasal Congestion    Comes and goes   Cough    HPI Upper Respiratory Infection: Symptoms include achiness, congestion, headache described as all over, nasal congestion, and non productive cough.  Onset of symptoms was 1 day ago, gradually worsening since that time. He is drinking moderate amounts of fluids. Evaluation to date: none.  Treatment to date: OTC cough syrup.  Past Medical History:  Diagnosis Date   Arthritis    Back pain    Cluster headaches    History of chickenpox    Hypertension     Past Surgical History:  Procedure Laterality Date   NO PAST SURGERIES     none      Outpatient Medications Prior to Visit  Medication Sig Dispense Refill    amLODipine (NORVASC) 5 MG tablet TAKE 1 TABLET (5 MG TOTAL) BY MOUTH DAILY. 30 tablet 2   buPROPion (WELLBUTRIN XL) 150 MG 24 hr tablet Take 1 tablet (150 mg total) by mouth daily. 90 tablet 0   buPROPion (WELLBUTRIN XL) 300 MG 24 hr tablet Take 1 tablet (300 mg total) by mouth daily. 90 tablet 0   celecoxib (CELEBREX) 100 MG capsule TAKE 1 CAPSULE BY MOUTH TWICE A DAY 60 capsule 2   doxycycline (VIBRA-TABS) 100 MG tablet Take 1 tablet (100 mg total) by mouth 2 (two) times daily. 20 tablet 0   gabapentin (NEURONTIN) 100 MG capsule TAKE1 CAPSULE IN MORNING, 1 CAPSULE AT NOON AND 3 CAPSULES IN THE EVENING. 150 capsule 3   KRILL OIL PO Take 1 capsule by mouth daily.     benzonatate (TESSALON) 100 MG capsule Take 1 capsule (100 mg total) by mouth 3 (three) times daily as needed for cough. (Patient not taking: Reported on 07/11/2021) 20 capsule 0   predniSONE (STERAPRED UNI-PAK 21 TAB) 10 MG (21) TBPK tablet Take per package instructions. Do not skip doses. Finish entire supply. (Patient not taking: Reported on 07/11/2021) 1 each 0   No facility-administered medications prior to visit.    No Known Allergies      Objective:     Physical Exam Vitals and nursing note reviewed.  Constitutional:      General: She is not in acute distress.  Appearance: Normal appearance.  HENT:     Head: Normocephalic.  Pulmonary:     Effort: No respiratory distress.  Musculoskeletal:     Cervical back: Normal range of motion.  Skin:    General: Skin is dry.     Coloration: Skin is not pale.  Neurological:     Mental Status: She is alert and oriented to person, place, and time.  Psychiatric:        Mood and Affect: Mood normal.   There were no vitals taken for this visit.  Wt Readings from Last 3 Encounters:  06/03/21 281 lb 9.6 oz (127.7 kg)  03/20/21 279 lb 12.8 oz (126.9 kg)  01/29/21 275 lb (124.7 kg)       Assessment & Plan:   Problem List Items Addressed This Visit       Other    COVID-19 - Primary    Sending Paxlovid, pt advised of FDA label for emergency use, how to take, & SE. Advised of CDC guidelines for self isolation/ ending isolation.  Advised of safe practice guidelines. Symptom Tier reviewed.  Encouraged to monitor for any worsening symptoms; watch for increased shortness of breath, weakness, and signs of dehydration. Instructed to rest and hydrate well.  Advised to wear a mask if necessary to leave the house.       Relevant Medications   nirmatrelvir/ritonavir EUA (PAXLOVID) 20 x 150 MG & 10 x 100MG  TABS    Meds ordered this encounter  Medications   nirmatrelvir/ritonavir EUA (PAXLOVID) 20 x 150 MG & 10 x 100MG  TABS    Sig: Take 3 tablets by mouth 2 (two) times daily for 5 days. (Take nirmatrelvir 150 mg two tablets twice daily for 5 days and ritonavir 100 mg one tablet twice daily for 5 days) Patient GFR is 86    Dispense:  30 tablet    Refill:  0    Order Specific Question:   Supervising Provider    Answer:   ANDY, CAMILLE L [[id number]]    I discussed the assessment and treatment plan with the patient. The patient was provided an opportunity to ask questions and all were answered. The patient agreed with the plan and demonstrated an understanding of the instructions.   The patient was advised to call back or seek an in-person evaluation if the symptoms worsen or if the condition fails to improve as anticipated.  I provided 21 minutes of face-to-face time during this encounter.   Jeanie Sewer, NP [place] [phone] (phone) [phone] (fax)  [place]

## 2021-07-11 NOTE — Assessment & Plan Note (Signed)
Sending Paxlovid, pt advised of FDA label for emergency use, how to take, & SE. Advised of CDC guidelines for self isolation/ ending isolation.  Advised of safe practice guidelines. Symptom Tier reviewed.  Encouraged to monitor for any worsening symptoms; watch for increased shortness of breath, weakness, and signs of dehydration. Instructed to rest and hydrate well.  Advised to wear a mask if necessary to leave the house.

## 2021-08-14 DIAGNOSIS — H6983 Other specified disorders of Eustachian tube, bilateral: Secondary | ICD-10-CM | POA: Diagnosis not present

## 2021-09-10 ENCOUNTER — Other Ambulatory Visit: Payer: Self-pay | Admitting: Registered Nurse

## 2021-09-10 DIAGNOSIS — I10 Essential (primary) hypertension: Secondary | ICD-10-CM

## 2021-09-17 ENCOUNTER — Encounter: Payer: Self-pay | Admitting: Registered Nurse

## 2021-09-17 ENCOUNTER — Ambulatory Visit (INDEPENDENT_AMBULATORY_CARE_PROVIDER_SITE_OTHER): Payer: 59 | Admitting: Registered Nurse

## 2021-09-17 ENCOUNTER — Other Ambulatory Visit: Payer: Self-pay

## 2021-09-17 VITALS — BP 120/82 | HR 67 | Temp 98.1°F | Resp 18 | Ht 73.0 in | Wt 281.8 lb

## 2021-09-17 DIAGNOSIS — I10 Essential (primary) hypertension: Secondary | ICD-10-CM

## 2021-09-17 DIAGNOSIS — Z13228 Encounter for screening for other metabolic disorders: Secondary | ICD-10-CM | POA: Diagnosis not present

## 2021-09-17 DIAGNOSIS — M19042 Primary osteoarthritis, left hand: Secondary | ICD-10-CM | POA: Diagnosis not present

## 2021-09-17 DIAGNOSIS — Z13220 Encounter for screening for lipoid disorders: Secondary | ICD-10-CM | POA: Diagnosis not present

## 2021-09-17 DIAGNOSIS — L309 Dermatitis, unspecified: Secondary | ICD-10-CM | POA: Diagnosis not present

## 2021-09-17 DIAGNOSIS — M159 Polyosteoarthritis, unspecified: Secondary | ICD-10-CM | POA: Insufficient documentation

## 2021-09-17 DIAGNOSIS — Z130 Encounter for screening for diseases of the blood and blood-forming organs and certain disorders involving the immune mechanism: Secondary | ICD-10-CM

## 2021-09-17 DIAGNOSIS — M5416 Radiculopathy, lumbar region: Secondary | ICD-10-CM | POA: Insufficient documentation

## 2021-09-17 DIAGNOSIS — Z1329 Encounter for screening for other suspected endocrine disorder: Secondary | ICD-10-CM | POA: Diagnosis not present

## 2021-09-17 DIAGNOSIS — Z0000 Encounter for general adult medical examination without abnormal findings: Secondary | ICD-10-CM | POA: Diagnosis not present

## 2021-09-17 DIAGNOSIS — M19041 Primary osteoarthritis, right hand: Secondary | ICD-10-CM

## 2021-09-17 LAB — CBC WITH DIFFERENTIAL/PLATELET
Basophils Absolute: 0 10*3/uL (ref 0.0–0.1)
Basophils Relative: 0.5 % (ref 0.0–3.0)
Eosinophils Absolute: 0.4 10*3/uL (ref 0.0–0.7)
Eosinophils Relative: 4.5 % (ref 0.0–5.0)
HCT: 43.3 % (ref 39.0–52.0)
Hemoglobin: 15 g/dL (ref 13.0–17.0)
Lymphocytes Relative: 36.7 % (ref 12.0–46.0)
Lymphs Abs: 3.3 10*3/uL (ref 0.7–4.0)
MCHC: 34.8 g/dL (ref 30.0–36.0)
MCV: 87 fl (ref 78.0–100.0)
Monocytes Absolute: 0.7 10*3/uL (ref 0.1–1.0)
Monocytes Relative: 7.8 % (ref 3.0–12.0)
Neutro Abs: 4.5 10*3/uL (ref 1.4–7.7)
Neutrophils Relative %: 50.5 % (ref 43.0–77.0)
Platelets: 208 10*3/uL (ref 150.0–400.0)
RBC: 4.97 Mil/uL (ref 4.22–5.81)
RDW: 13.1 % (ref 11.5–15.5)
WBC: 9 10*3/uL (ref 4.0–10.5)

## 2021-09-17 LAB — COMPREHENSIVE METABOLIC PANEL
ALT: 69 U/L — ABNORMAL HIGH (ref 0–53)
AST: 31 U/L (ref 0–37)
Albumin: 4.6 g/dL (ref 3.5–5.2)
Alkaline Phosphatase: 82 U/L (ref 39–117)
BUN: 19 mg/dL (ref 6–23)
CO2: 28 mEq/L (ref 19–32)
Calcium: 9.9 mg/dL (ref 8.4–10.5)
Chloride: 103 mEq/L (ref 96–112)
Creatinine, Ser: 0.92 mg/dL (ref 0.40–1.50)
GFR: 98.07 mL/min (ref >60.00)
Glucose, Bld: 105 mg/dL — ABNORMAL HIGH (ref 70–99)
Potassium: 4.3 mEq/L (ref 3.5–5.1)
Sodium: 140 mEq/L (ref 135–145)
Total Bilirubin: 0.4 mg/dL (ref 0.2–1.2)
Total Protein: 7 g/dL (ref 6.0–8.3)

## 2021-09-17 LAB — HEMOGLOBIN A1C: Hgb A1c MFr Bld: 6 % (ref 4.6–6.5)

## 2021-09-17 LAB — LIPID PANEL
Cholesterol: 202 mg/dL — ABNORMAL HIGH (ref 0–200)
HDL: 39.9 mg/dL (ref >39.00)
LDL Cholesterol: 141 mg/dL — ABNORMAL HIGH (ref 0–99)
NonHDL: 161.72
Total CHOL/HDL Ratio: 5
Triglycerides: 104 mg/dL (ref 0.0–149.0)
VLDL: 20.8 mg/dL (ref 0.0–40.0)

## 2021-09-17 LAB — TSH: TSH: 2.07 u[IU]/mL (ref 0.35–5.50)

## 2021-09-17 MED ORDER — GABAPENTIN 100 MG PO CAPS: ORAL_CAPSULE | ORAL | 3 refills | Status: DC

## 2021-09-17 MED ORDER — CELECOXIB 100 MG PO CAPS: 100.0000 mg | ORAL_CAPSULE | Freq: Two times a day (BID) | ORAL | 3 refills | Status: DC

## 2021-09-17 MED ORDER — AMLODIPINE BESYLATE 5 MG PO TABS: 5.0000 mg | ORAL_TABLET | Freq: Every day | ORAL | 2 refills | Status: DC

## 2021-09-17 NOTE — Progress Notes (Signed)
? ?Established Patient Office Visit ? ?Subjective:  ?Patient ID: Joseph Williamson, male    DOB: [birth year]  Age: 51 y.o. MRN: [id number] ? ?CC:  ?Chief Complaint  ?Patient presents with  ? Annual Exam  ?  Patient states he is here for a CPE.  ? ? ?HPI ?Joseph Williamson presents for CPE ? ?No acute concerns ? ?Histories reviewed and updated with patient.  ? ?Hypertension: ?Patient Currently taking: amlodipine '5mg'$  po qd ?Good effect. No AEs. ?Denies CV symptoms including: chest pain, shob, doe, headache, visual changes, fatigue, claudication, and dependent edema.  ? ?Previous readings and labs: ?BP Readings from Last 3 Encounters:  ?09/17/21 120/82  ?06/03/21 (!) 143/90  ?03/20/21 125/85  ? ?Lab Results  ?Component Value Date  ? CREATININE 1.01 03/20/2021  ? ? ?Joint pains ?Arthritis in hands, pain in lower back ?Uses celebrex and gabapentin prn ?Good effect, no AE ? ?Dermatitis ?Formerly pt of Dr. Denna Haggard ?Lost to follow up ?Would like to reestablish ? ?Past Medical History:  ?Diagnosis Date  ? Arthritis   ? Back pain   ? Cluster headaches   ? History of chickenpox   ? Hypertension   ? ? ?Past Surgical History:  ?Procedure Laterality Date  ? NO PAST SURGERIES    ? none    ? ? ?Family History  ?Problem Relation Age of Onset  ? Arthritis Mother   ? Cancer Mother   ?     Pancreactic  ? Colon polyps Mother   ? Pancreatic cancer Mother   ? Gout Father   ? Kidney Stones Father   ? Colon polyps Father   ? Stroke Father   ?     2/2 COVID  ? Colon cancer Neg Hx   ? Diabetes Neg Hx   ? Kidney disease Neg Hx   ? Liver disease Neg Hx   ? ? ?Social History  ? ?Socioeconomic History  ? Marital status: Married  ?  Spouse name: Not on file  ? Number of children: Not on file  ? Years of education: Not on file  ? Highest education level: Not on file  ?Occupational History  ? Occupation: Freight forwarder  ?  Employer: [employer]  ?Tobacco Use  ? Smoking status: Every Day  ?  Packs/day: 0.10  ?  Types: Cigarettes  ? Smokeless tobacco: Never   ? Tobacco comments:  ?  smokes about 2 cigs per day  ?Vaping Use  ? Vaping Use: Never used  ?Substance and Sexual Activity  ? Alcohol use: Not Currently  ?  Alcohol/week: 14.0 standard drinks  ?  Types: 14 Cans of beer per week  ?  Comment: quit some time ago  ? Drug use: No  ? Sexual activity: Yes  ?Other Topics Concern  ? Not on file  ?Social History Narrative  ? Not on file  ? ?Social Determinants of Health  ? ?Financial Resource Strain: Not on file  ?Food Insecurity: Not on file  ?Transportation Needs: Not on file  ?Physical Activity: Not on file  ?Stress: Not on file  ?Social Connections: Not on file  ?Intimate Partner Violence: Not on file  ? ? ?Outpatient Medications Prior to Visit  ?Medication Sig Dispense Refill  ? KRILL OIL PO Take 1 capsule by mouth daily.    ? amLODipine (NORVASC) 5 MG tablet TAKE 1 TABLET (5 MG TOTAL) BY MOUTH DAILY. 30 tablet 2  ? celecoxib (CELEBREX) 100 MG capsule TAKE 1 CAPSULE BY MOUTH  TWICE A DAY 60 capsule 2  ? gabapentin (NEURONTIN) 100 MG capsule TAKE1 CAPSULE IN MORNING, 1 CAPSULE AT NOON AND 3 CAPSULES IN THE EVENING. 150 capsule 3  ? buPROPion (WELLBUTRIN XL) 150 MG 24 hr tablet Take 1 tablet (150 mg total) by mouth daily. (Patient not taking: Reported on 09/17/2021) 90 tablet 0  ? buPROPion (WELLBUTRIN XL) 300 MG 24 hr tablet Take 1 tablet (300 mg total) by mouth daily. (Patient not taking: Reported on 09/17/2021) 90 tablet 0  ? doxycycline (VIBRA-TABS) 100 MG tablet Take 1 tablet (100 mg total) by mouth 2 (two) times daily. (Patient not taking: Reported on 09/17/2021) 20 tablet 0  ? ?No facility-administered medications prior to visit.  ? ? ?No Known Allergies ? ?ROS ?Review of Systems  ?Constitutional: Negative.   ?HENT: Negative.    ?Eyes: Negative.   ?Respiratory: Negative.    ?Cardiovascular: Negative.   ?Gastrointestinal: Negative.   ?Genitourinary: Negative.   ?Musculoskeletal: Negative.   ?Skin: Negative.   ?Neurological: Negative.   ?Psychiatric/Behavioral: Negative.     ?All other systems reviewed and are negative. ? ?  ?Objective:  ?  ?Physical Exam ?Vitals and nursing note reviewed.  ?Constitutional:   ?   General: He is not in acute distress. ?   Appearance: Normal appearance. He is not ill-appearing, toxic-appearing or diaphoretic.  ?HENT:  ?   Head: Normocephalic and atraumatic.  ?   Right Ear: Tympanic membrane, ear canal and external ear normal. There is no impacted cerumen.  ?   Left Ear: Tympanic membrane, ear canal and external ear normal. There is no impacted cerumen.  ?   Nose: Nose normal. No congestion or rhinorrhea.  ?   Mouth/Throat:  ?   Mouth: Mucous membranes are moist.  ?   Pharynx: Oropharynx is clear. No oropharyngeal exudate or posterior oropharyngeal erythema.  ?Eyes:  ?   General: No scleral icterus.    ?   Right eye: No discharge.     ?   Left eye: No discharge.  ?   Extraocular Movements: Extraocular movements intact.  ?   Conjunctiva/sclera: Conjunctivae normal.  ?   Pupils: Pupils are equal, round, and reactive to light.  ?Neck:  ?   Vascular: No carotid bruit.  ?Cardiovascular:  ?   Rate and Rhythm: Normal rate and regular rhythm.  ?   Pulses: Normal pulses.  ?   Heart sounds: Normal heart sounds. No murmur heard. ?  No friction rub. No gallop.  ?Pulmonary:  ?   Effort: Pulmonary effort is normal. No respiratory distress.  ?   Breath sounds: Normal breath sounds. No stridor. No wheezing, rhonchi or rales.  ?Chest:  ?   Chest wall: No tenderness.  ?Abdominal:  ?   General: Abdomen is flat. Bowel sounds are normal. There is no distension.  ?   Palpations: Abdomen is soft. There is no mass.  ?   Tenderness: There is no abdominal tenderness. There is no right CVA tenderness, left CVA tenderness, guarding or rebound.  ?   Hernia: No hernia is present.  ?Musculoskeletal:     ?   General: No swelling, tenderness, deformity or signs of injury. Normal range of motion.  ?   Cervical back: Normal range of motion and neck supple. No rigidity or tenderness.  ?    Right lower leg: No edema.  ?   Left lower leg: No edema.  ?Lymphadenopathy:  ?   Cervical: No cervical adenopathy.  ?Skin: ?  General: Skin is warm and dry.  ?   Capillary Refill: Capillary refill takes less than 2 seconds.  ?   Coloration: Skin is not jaundiced or pale.  ?   Findings: No bruising, erythema, lesion or rash.  ?Neurological:  ?   General: No focal deficit present.  ?   Mental Status: He is alert and oriented to person, place, and time. Mental status is at baseline.  ?   Cranial Nerves: No cranial nerve deficit.  ?   Motor: No weakness.  ?   Gait: Gait normal.  ?Psychiatric:     ?   Mood and Affect: Mood normal.     ?   Behavior: Behavior normal.     ?   Thought Content: Thought content normal.     ?   Judgment: Judgment normal.  ? ? ?BP 120/82   Pulse 67   Temp 98.1 ?F (36.7 ?C) (Temporal)   Resp 18   Ht '6\' 1"'$  (1.854 m)   Wt 281 lb 12.8 oz (127.8 kg)   SpO2 98%   BMI 37.18 kg/m?  ?Wt Readings from Last 3 Encounters:  ?09/17/21 281 lb 12.8 oz (127.8 kg)  ?06/03/21 281 lb 9.6 oz (127.7 kg)  ?03/20/21 279 lb 12.8 oz (126.9 kg)  ? ? ? ?Health Maintenance Due  ?Topic Date Due  ? HIV Screening  Never done  ? ? ?There are no preventive care reminders to display for this patient. ? ?Lab Results  ?Component Value Date  ? TSH 2.08 03/20/2021  ? ?Lab Results  ?Component Value Date  ? WBC 10.4 03/20/2021  ? HGB 15.1 03/20/2021  ? HCT 44.3 03/20/2021  ? MCV 87.8 03/20/2021  ? PLT 217.0 03/20/2021  ? ?Lab Results  ?Component Value Date  ? NA 139 03/20/2021  ? K 4.3 03/20/2021  ? CO2 27 03/20/2021  ? GLUCOSE 77 03/20/2021  ? BUN 19 03/20/2021  ? CREATININE 1.01 03/20/2021  ? BILITOT 0.4 03/20/2021  ? ALKPHOS 82 03/20/2021  ? AST 28 03/20/2021  ? ALT 55 (H) 03/20/2021  ? PROT 7.2 03/20/2021  ? ALBUMIN 4.5 03/20/2021  ? CALCIUM 9.9 03/20/2021  ? GFR 87.99 03/20/2021  ? ?Lab Results  ?Component Value Date  ? CHOL 214 (H) 03/20/2021  ? ?Lab Results  ?Component Value Date  ? HDL 36.30 (L) 03/20/2021  ? ?Lab  Results  ?Component Value Date  ? LDLCALC 157 (H) 09/11/2019  ? ?Lab Results  ?Component Value Date  ? TRIG 232.0 (H) 03/20/2021  ? ?Lab Results  ?Component Value Date  ? CHOLHDL 6 03/20/2021  ? ?Lab Results

## 2021-09-17 NOTE — Patient Instructions (Addendum)
[name] -  ? ?Great to see you ? ?No concerns on exam ? ?Let's recheck labs ? ?See you in a year, sooner if concerns arise, ? ?Referral to Dr. Denna Haggard sent ? ? ?Thanks, ? ?Rich  ? ? ? ?If you have lab work done today you will be contacted with your lab results within the next 2 weeks.  If you have not heard from [place] then please contact us. The fastest way to get your results is to register for My Chart. ? ? ?IF you received an x-ray today, you will receive an invoice from [facility]. Please contact [facility] at [phone] with questions or concerns regarding your invoice.  ? ?IF you received labwork today, you will receive an invoice from [facility]. Please contact [facility] at [phone] with questions or concerns regarding your invoice.  ? ?Our billing staff will not be able to assist you with questions regarding bills from these companies. ? ?You will be contacted with the lab results as soon as they are available. The fastest way to get your results is to activate your My Chart account. Instructions are located on the last page of this paperwork. If you have not heard from [place] regarding the results in 2 weeks, please contact this office. ?  ? ? ?

## 2021-11-03 ENCOUNTER — Encounter: Payer: Self-pay | Admitting: Registered Nurse

## 2021-11-19 DIAGNOSIS — L218 Other seborrheic dermatitis: Secondary | ICD-10-CM | POA: Diagnosis not present

## 2021-11-19 DIAGNOSIS — H7291 Unspecified perforation of tympanic membrane, right ear: Secondary | ICD-10-CM | POA: Diagnosis not present

## 2021-11-19 DIAGNOSIS — L821 Other seborrheic keratosis: Secondary | ICD-10-CM | POA: Diagnosis not present

## 2021-11-19 DIAGNOSIS — L538 Other specified erythematous conditions: Secondary | ICD-10-CM | POA: Diagnosis not present

## 2021-11-19 DIAGNOSIS — L298 Other pruritus: Secondary | ICD-10-CM | POA: Diagnosis not present

## 2021-11-19 DIAGNOSIS — L918 Other hypertrophic disorders of the skin: Secondary | ICD-10-CM | POA: Diagnosis not present

## 2021-11-19 DIAGNOSIS — D1801 Hemangioma of skin and subcutaneous tissue: Secondary | ICD-10-CM | POA: Diagnosis not present

## 2021-11-19 DIAGNOSIS — L814 Other melanin hyperpigmentation: Secondary | ICD-10-CM | POA: Diagnosis not present

## 2021-11-19 DIAGNOSIS — R208 Other disturbances of skin sensation: Secondary | ICD-10-CM | POA: Diagnosis not present

## 2021-11-19 DIAGNOSIS — Z789 Other specified health status: Secondary | ICD-10-CM | POA: Diagnosis not present

## 2021-11-19 DIAGNOSIS — H6504 Acute serous otitis media, recurrent, right ear: Secondary | ICD-10-CM | POA: Diagnosis not present

## 2021-11-26 DIAGNOSIS — H6501 Acute serous otitis media, right ear: Secondary | ICD-10-CM | POA: Diagnosis not present

## 2022-08-20 ENCOUNTER — Other Ambulatory Visit: Payer: Self-pay

## 2022-08-20 ENCOUNTER — Emergency Department (HOSPITAL_BASED_OUTPATIENT_CLINIC_OR_DEPARTMENT_OTHER)
Admission: EM | Admit: 2022-08-20 | Discharge: 2022-08-20 | Disposition: A | Discharge status: Home / Self Care | Payer: 59 | Attending: Emergency Medicine | Admitting: Emergency Medicine

## 2022-08-20 DIAGNOSIS — T1591XA Foreign body on external eye, part unspecified, right eye, initial encounter: Secondary | ICD-10-CM

## 2022-08-20 DIAGNOSIS — X58XXXA Exposure to other specified factors, initial encounter: Secondary | ICD-10-CM | POA: Insufficient documentation

## 2022-08-20 DIAGNOSIS — S0501XA Injury of conjunctiva and corneal abrasion without foreign body, right eye, initial encounter: Secondary | ICD-10-CM

## 2022-08-20 DIAGNOSIS — T1501XA Foreign body in cornea, right eye, initial encounter: Secondary | ICD-10-CM | POA: Diagnosis not present

## 2022-08-20 DIAGNOSIS — Z79899 Other long term (current) drug therapy: Secondary | ICD-10-CM | POA: Insufficient documentation

## 2022-08-20 MED ORDER — ERYTHROMYCIN 5 MG/GM OP OINT: TOPICAL_OINTMENT | OPHTHALMIC | 0 refills | Status: DC

## 2022-08-20 MED ORDER — FLUORESCEIN SODIUM 1 MG OP STRP
1.0000 | ORAL_STRIP | Freq: Once | OPHTHALMIC | Status: AC
  Administered 2022-08-20: 1 via OPHTHALMIC
  Filled 2022-08-20: qty 1

## 2022-08-20 MED ORDER — TETRACAINE HCL 0.5 % OP SOLN
2.0000 [drp] | Freq: Once | OPHTHALMIC | Status: AC
  Administered 2022-08-20: 2 [drp] via OPHTHALMIC
  Filled 2022-08-20: qty 4

## 2022-08-20 NOTE — Discharge Instructions (Addendum)
You were seen in the emergency department for your eye pain.  You did have a foreign body under eye that we were able to remove and do have a scratch that was underneath the eye.  This is treated with antibiotic eye ointment and you should complete the prescription as prescribed.  You can take Tylenol or Motrin as needed for pain.  You should follow-up with your eye doctor in the next 2 to 3 days to have your eye rechecked.  You should return to the emergency department for significantly worsening pain, decreased vision, fevers or pus draining out of your eye or if you have any other new or concerning symptoms.

## 2022-08-20 NOTE — ED Triage Notes (Signed)
Pt reports right eye pain since yesterday. Pt states he was working outside and feels like something blew in his eye - maybe dust or gravel.  Right eye visibly swollen and red.

## 2022-08-20 NOTE — ED Provider Notes (Signed)
[facility] Provider Note   CSN: [id number] Arrival date & time: 08/20/22  1015     History  Chief Complaint  Patient presents with   Eye Pain    Joseph Williamson is a 52 y.o. male.  Patient is a 52 year old male with no significant past medical history presenting to the emergency department with right eye pain.  The patient states that he was working with a lot of equipment last night with a lot of dust flying up in the air and felt like he may have gotten something in his right eye.  He states that he tried to flush his eye out however was continuing to have pain and redness in his eye this morning.  He denies any photophobia.  He denies any vision changes.  He denies any fevers or eye drainage.  He states he does not wear glasses or contacts.  The history is provided by the patient.  Eye Pain       Home Medications Prior to Admission medications   Medication Sig Start Date End Date Taking? Authorizing Provider  amLODipine (NORVASC) 5 MG tablet Take 1 tablet (5 mg total) by mouth daily. 09/17/21   Maximiano Coss, NP  celecoxib (CELEBREX) 100 MG capsule Take 1 capsule (100 mg total) by mouth 2 (two) times daily. 09/17/21   Maximiano Coss, NP  erythromycin ophthalmic ointment Place a 1/2 inch ribbon of ointment into the lower eyelid 4 times daily for 5 days 08/20/22   Leanord Asal K, DO  gabapentin (NEURONTIN) 100 MG capsule TAKE1 CAPSULE IN MORNING, 1 CAPSULE AT NOON AND 3 CAPSULES IN THE EVENING. 09/17/21   Maximiano Coss, NP  KRILL OIL PO Take 1 capsule by mouth daily.    [provider]      Allergies    Patient has no known allergies.    Review of Systems   Review of Systems  Eyes:  Positive for pain.    Physical Exam Updated Vital Signs BP (!) 124/92 (BP Location: Right Arm)   Pulse 64   Temp 98.2 F (36.8 C) (Oral)   Resp 14   SpO2 98%  Physical Exam Vitals and nursing note reviewed.  Constitutional:       General: He is not in acute distress.    Appearance: Normal appearance.  HENT:     Head: Normocephalic and atraumatic.     Nose: Nose normal.     Mouth/Throat:     Mouth: Mucous membranes are moist.     Pharynx: Oropharynx is clear.  Eyes:     Extraocular Movements: Extraocular movements intact.     Pupils: Pupils are equal, round, and reactive to light.     Comments: Conjunctival injection of right eye with visible foreign body on cornea around 3 o'clock position, positive fluorescein stain uptake No conjunctival injection on the left with no fluorescein uptake IOP 9 on the right, IOP 13 on the left No pain with EOM bilaterally  Pulmonary:     Effort: Pulmonary effort is normal.  Musculoskeletal:        General: Normal range of motion.     Cervical back: Normal range of motion and neck supple.  Skin:    General: Skin is warm and dry.  Neurological:     Mental Status: He is alert and oriented to person, place, and time.  Psychiatric:        Mood and Affect: Mood normal.  Behavior: Behavior normal.     ED Results / Procedures / Treatments   Labs (all labs ordered are listed, but only abnormal results are displayed) Labs Reviewed - No data to display  EKG None  Radiology No results found.  Procedures .Foreign Body Removal  Date/Time: 08/20/2022 10:54 AM  Performed by: Kemper Durie, DO Authorized by: Kemper Durie, DO  Consent: Verbal consent obtained. Risks and benefits: risks, benefits and alternatives were discussed Consent given by: patient Patient identity confirmed: verbally with patient Body area: eye Location details: right cornea  Anesthesia: Local Anesthetic: tetracaine drops  Sedation: Patient sedated: no  Patient cooperative: yes Localization method: eyelid eversion, magnification and visualized Removal mechanism: moist cotton swab Eye examined with fluorescein. Corneal abrasion size: small Corneal abrasion location:  medial No residual rust ring present. Dressing: antibiotic ointment Depth: superficial Complexity: simple 1 objects recovered. Post-procedure assessment: foreign body removed Patient tolerance: patient tolerated the procedure well with no immediate complications      Medications Ordered in ED Medications  fluorescein ophthalmic strip 1 strip (1 strip Both Eyes Given 08/20/22 1034)  tetracaine (PONTOCAINE) 0.5 % ophthalmic solution 2 drop (2 drops Both Eyes Given 08/20/22 1034)    ED Course/ Medical Decision Making/ A&P                             Medical Decision Making This patient presents to the ED with chief complaint(s) of right eye pain with no pertinent past medical history which further complicates the presenting complaint. The complaint involves an extensive differential diagnosis and also carries with it a high risk of complications and morbidity.    The differential diagnosis includes foreign body, corneal abrasion, no vision changes making acute angle closure glaucoma unlikely, no photophobia making iritis unlikely, conjunctivitis  Additional history obtained: Additional history obtained from N/A Records reviewed N/A  ED Course and Reassessment: Patient has unilateral painful red eye on exam.  Visual acuity showed mild decreased visual acuity on the right compared to the left but no significant abnormality.  His exam did show evidence of a foreign body on his right cornea that was able to be removed by myself using cotton swab tip.  He did have fluorescein stain concerning for underlying corneal abrasion and he will be treated with erythromycin ointment.  IOP within normal range bilaterally.  He states he does have an eye doctor and was recommended outpatient follow-up.  Independent labs interpretation:  N/A  Independent visualization of imaging: N/A  Consultation: - Consulted or discussed management/test interpretation w/ external professional: N/A  Consideration  for admission or further workup: Patient has no emergent conditions requiring admission or further work-up at this time and is stable for discharge home with primary care follow-up  Social Determinants of health: N/A    Risk Prescription drug management.          Final Clinical Impression(s) / ED Diagnoses Final diagnoses:  Abrasion of right cornea, initial encounter  Foreign body of right eye, initial encounter    Rx / DC Orders ED Discharge Orders          Ordered    erythromycin ophthalmic ointment  Status:  Discontinued        08/20/22 1056    erythromycin ophthalmic ointment        08/20/22 [place], [place], DO 08/20/22 1058

## 2022-09-21 ENCOUNTER — Encounter: Payer: 59 | Admitting: Registered Nurse

## 2022-10-29 ENCOUNTER — Ambulatory Visit: Payer: 59 | Admitting: Family

## 2022-10-29 ENCOUNTER — Telehealth: Payer: Self-pay | Admitting: Registered Nurse

## 2022-10-29 VITALS — BP 128/83 | HR 74 | Temp 97.8°F | Ht 73.0 in | Wt 207.4 lb

## 2022-10-29 DIAGNOSIS — J0110 Acute frontal sinusitis, unspecified: Secondary | ICD-10-CM

## 2022-10-29 DIAGNOSIS — J029 Acute pharyngitis, unspecified: Secondary | ICD-10-CM | POA: Diagnosis not present

## 2022-10-29 LAB — POCT RAPID STREP A (OFFICE): Rapid Strep A Screen: NEGATIVE

## 2022-10-29 MED ORDER — AMOXICILLIN-POT CLAVULANATE 875-125 MG PO TABS: 1.0000 | ORAL_TABLET | Freq: Two times a day (BID) | ORAL | 0 refills | Status: DC

## 2022-10-29 NOTE — Progress Notes (Signed)
Patient ID: Joseph Williamson, male    DOB: 03/21/1971, 52 y.o.   MRN: [id number]  Chief Complaint  Patient presents with   Sinus Problem    sx for 4d    HPI:      Sinus sx:   Pt c/o Nasal congestion, cough with green mucus, chills , body aches, sinus pain and headache.  Present since Tuesday morning. Has tried ibuprofen and mucinex which did help sx. Reports feeling hot and had sweats, but did not check temp. Did not do home covid test. Reports with family over last weekend and several family members had cold sx. pt is an active smoker.      Assessment & Plan:  1. Sore throat - rapid strep neg. Advised pt can take Ibuprofen 600mg  tid prn for sore throat pain, swelling, and fever. Gargle with warm salt water several tid. OK to use OTC Chloraseptic spray and/or throat lozenges prn. Drink plenty of water.   - POCT rapid strep A  2. Acute non-recurrent frontal sinusitis - pt actively smoking. encouraged cessation, pt wants to establish new PCP & get physical and discuss cessation options. Sending Augmentin, advised on use & SE. Advised to start steroid nasal spray, advised on use and continue thru allergy season along with nasal saline spray tid & prior to steroid spray.  - amoxicillin-clavulanate (AUGMENTIN) 875-125 MG tablet; Take 1 tablet by mouth 2 (two) times daily after a meal.  Dispense: 10 tablet; Refill: 0  Subjective:    Outpatient Medications Prior to Visit  Medication Sig Dispense Refill   amLODipine (NORVASC) 5 MG tablet Take 1 tablet (5 mg total) by mouth daily. (Patient not taking: Reported on 10/29/2022) 30 tablet 2   celecoxib (CELEBREX) 100 MG capsule Take 1 capsule (100 mg total) by mouth 2 (two) times daily. (Patient not taking: Reported on 10/29/2022) 90 capsule 3   erythromycin ophthalmic ointment Place a 1/2 inch ribbon of ointment into the lower eyelid 4 times daily for 5 days (Patient not taking: Reported on 10/29/2022) 3.5 g 0   gabapentin (NEURONTIN) 100 MG capsule  TAKE1 CAPSULE IN MORNING, 1 CAPSULE AT NOON AND 3 CAPSULES IN THE EVENING. (Patient not taking: Reported on 10/29/2022) 150 capsule 3   KRILL OIL PO Take 1 capsule by mouth daily. (Patient not taking: Reported on 10/29/2022)     No facility-administered medications prior to visit.   Past Medical History:  Diagnosis Date   Arthritis    Back pain    Cluster headaches    History of chickenpox    Hypertension    Past Surgical History:  Procedure Laterality Date   NO PAST SURGERIES     none     No Known Allergies    Objective:    Physical Exam Vitals and nursing note reviewed.  Constitutional:      General: He is not in acute distress.    Appearance: Normal appearance.  HENT:     Head: Normocephalic.     Right Ear: Tympanic membrane and ear canal normal.     Left Ear: Tympanic membrane and ear canal normal.     Nose:     Right Sinus: Frontal sinus tenderness present. No maxillary sinus tenderness.     Left Sinus: Frontal sinus tenderness present. No maxillary sinus tenderness.     Mouth/Throat:     Mouth: Mucous membranes are moist.     Pharynx: No pharyngeal swelling, oropharyngeal exudate, posterior oropharyngeal erythema or uvula swelling.  Tonsils: No tonsillar exudate or tonsillar abscesses.  Cardiovascular:     Rate and Rhythm: Normal rate and regular rhythm.  Pulmonary:     Effort: Pulmonary effort is normal.     Breath sounds: Normal breath sounds.  Musculoskeletal:        General: Normal range of motion.     Cervical back: Normal range of motion.  Lymphadenopathy:     Head:     Right side of head: No preauricular or posterior auricular adenopathy.     Left side of head: No preauricular or posterior auricular adenopathy.     Cervical: No cervical adenopathy.  Skin:    General: Skin is warm and dry.  Neurological:     Mental Status: He is alert and oriented to person, place, and time.  Psychiatric:        Mood and Affect: Mood normal.    BP 128/83   Pulse  74   Temp 97.8 F (36.6 C) (Temporal)   Ht 6\' 1"  (1.854 m)   Wt 207 lb 6.4 oz (94.1 kg)   SpO2 99%   BMI 27.36 kg/m  Wt Readings from Last 3 Encounters:  10/29/22 207 lb 6.4 oz (94.1 kg)  09/17/21 281 lb 12.8 oz (127.8 kg)  06/03/21 281 lb 9.6 oz (127.7 kg)      Dulce Sellar, NP

## 2022-10-29 NOTE — Telephone Encounter (Signed)
Erroneous encounter

## 2022-10-29 NOTE — Patient Instructions (Signed)
It was very nice to see you today!   I have sent an antibiotic to your pharmacy. Highly recommend a steroid nasal spray if you have allergy symptoms to prevent future infections!  Follow the instructions below: Start generic Nasacort or Flonase nasal spray 1 spray each nostril twice a day for 3 days, then reduce to daily use. 2 sprays twice a day is ok for really bad symptoms for 1 week, then reduce to 1 spray twice a day or daily. Use Nasal saline spray (i.e.,Simply Saline or other generic) or a nasal saline lavage (i.e.,NeilMed, Neti Pot) to flush allergens, general disinfecting &  prior to using medicated nasal sprays. May add over the counter antihistamines such as Xyzal (levocetirizine), Zyrtec (cetirizine), Claritin (loratadine), or Allegra (fexofenadine) daily as needed. May take twice a day if needed as long as it does not cause drowsiness or too much dryness in nose, mouth or throat. May also use Pataday or generic over the counter eye drops: 1 drop in each eye daily as needed for itchy/watery eyes.        PLEASE NOTE:  If you had any lab tests please let us know if you have not heard back within a few days. You may see your results on MyChart before we have a chance to review them but we will give you a call once they are reviewed by [place]. If we ordered any referrals today, please let us know if you have not heard from their office within the next week.

## 2022-11-11 ENCOUNTER — Ambulatory Visit: Payer: 59 | Admitting: Family Medicine

## 2022-11-11 ENCOUNTER — Encounter: Payer: Self-pay | Admitting: Family Medicine

## 2022-11-11 VITALS — BP 122/68 | HR 89 | Temp 99.9°F | Ht 73.0 in | Wt 212.1 lb

## 2022-11-11 DIAGNOSIS — J029 Acute pharyngitis, unspecified: Secondary | ICD-10-CM | POA: Diagnosis not present

## 2022-11-11 LAB — POCT RAPID STREP A (OFFICE): Rapid Strep A Screen: NEGATIVE

## 2022-11-11 MED ORDER — AMOXICILLIN-POT CLAVULANATE 875-125 MG PO TABS: 1.0000 | ORAL_TABLET | Freq: Two times a day (BID) | ORAL | 0 refills | Status: DC

## 2022-11-11 NOTE — Progress Notes (Deleted)
   Acute Office Visit   Subjective:  Patient ID: Joseph Williamson, male    DOB: Jul 07, 1970, 52 y.o.   MRN: [id number]  No chief complaint on file.   HPI Patient is a 52 year old caucasian male that presents with   ROS See HPI above      Objective:    There were no vitals taken for this visit. {Vitals History (Optional):23777}  Physical Exam  No results found for any visits on 11/11/22.      Assessment & Plan:  There are no diagnoses linked to this encounter.  No follow-ups on file.  Zandra Abts, NP

## 2022-11-11 NOTE — Progress Notes (Signed)
Acute Office Visit   Subjective:  Patient ID: Joseph Williamson, male    DOB: 1970-07-13, 52 y.o.   MRN: [id number]  Chief Complaint  Patient presents with   Sore Throat    Pt is here today with Sore Throat since Sunday 11/08/2022. Pt reports he was seen at for Sinus infection 10 days ago. Otc Ibuprofen     Sore Throat    Patient is a 52 year old caucasian male that presents with sore throat, chills, nasal congestion, headache, and sinus pain since Sunday, 11/08/2022.   Patient was seen by Dulce Sellar, NP with [facility] on 10/29/2022 for nasal congestion, cough with green mucus, chills, body aches, sinus pain, and headache. Dx with sinus infection. He was prescribed Augmentin 875/125mg  for 5 days.   He reports all symptoms are still present except the cough and body aches.   ROS See HPI above      Objective:   BP 122/68   Pulse 89   Temp 99.9 F (37.7 C)   Ht 6\' 1"  (1.854 m)   Wt 212 lb 2 oz (96.2 kg)   SpO2 98%   BMI 27.99 kg/m    Physical Exam Vitals reviewed.  Constitutional:      General: He is not in acute distress.    Appearance: Normal appearance. He is not ill-appearing, toxic-appearing or diaphoretic.  HENT:     Head: Normocephalic and atraumatic.     Right Ear: Tympanic membrane, ear canal and external ear normal. No drainage. There is no impacted cerumen.     Left Ear: Tympanic membrane, ear canal and external ear normal. No drainage. There is no impacted cerumen.     Nose:     Right Sinus: No maxillary sinus tenderness or frontal sinus tenderness.     Left Sinus: No maxillary sinus tenderness or frontal sinus tenderness.     Mouth/Throat:     Pharynx: Oropharynx is clear. Uvula midline. Posterior oropharyngeal erythema present. No pharyngeal swelling, oropharyngeal exudate or uvula swelling.  Eyes:     General:        Right eye: No discharge.        Left eye: No discharge.     Conjunctiva/sclera: Conjunctivae normal.   Cardiovascular:     Rate and Rhythm: Normal rate and regular rhythm.     Heart sounds: Normal heart sounds. No murmur heard.    No friction rub. No gallop.  Pulmonary:     Effort: Pulmonary effort is normal. No respiratory distress.     Breath sounds: Normal breath sounds.  Musculoskeletal:        General: Normal range of motion.  Lymphadenopathy:     Head:     Right side of head: Submental and submandibular adenopathy present.     Left side of head: Submental and submandibular adenopathy present.  Skin:    General: Skin is warm and dry.  Neurological:     General: No focal deficit present.     Mental Status: He is alert and oriented to person, place, and time. Mental status is at baseline.  Psychiatric:        Mood and Affect: Mood normal.        Behavior: Behavior normal.        Thought Content: Thought content normal.        Judgment: Judgment normal.    Results for orders placed or performed in visit on 11/11/22  POCT rapid strep A  Result Value Ref Range   Rapid Strep A Screen Negative Negative       Assessment & Plan:  Sore throat -     POCT rapid strep A -     Amoxicillin-Pot Clavulanate; Take 1 tablet by mouth 2 (two) times daily for 10 days.  Dispense: 20 tablet; Refill: 0  -Negative for strep throat.  -Prescribed Augmentin 875/125mg  tablet, 1 tablet twice a day for 10 days. Symptoms returned. Erythema throat, lymph node tenderness, & low grade fever.  -Rest -Promote hydration, 64-100oz of water a day. -Alternate Tylenol 1000mg  and Ibuprofen 600-800mg  every 4 hours for pain, headache, and body aches. Recommend to eat something when taking Ibuprofen.   -Can use throat lozenges and gargle with warm salt water to help with throat discomfort.  -If not improved or becomes worse, follow up.  -Patient expressed understanding and is in agreement with plan.   Zandra Abts, NP

## 2022-11-11 NOTE — Patient Instructions (Addendum)
-  Negative for strep throat.  -START Augmentin 875/125mg  tablet, 1 tablet twice a day for 10 days. -Rest -Promote hydration, 64-100oz of water a day. -Alternate Tylenol 1000mg  and Ibuprofen 600-800mg  every 4 hours for pain, headache, and body aches. Recommend to eat something when taking Ibuprofen.   -Can use throat lozenges and gargle with warm salt water to help with throat discomfort.  -If not improved or becomes worse, follow up.

## 2022-11-18 ENCOUNTER — Ambulatory Visit: Payer: 59 | Admitting: Internal Medicine

## 2022-11-18 ENCOUNTER — Encounter: Payer: Self-pay | Admitting: Internal Medicine

## 2022-11-18 VITALS — BP 119/72 | HR 66 | Temp 98.1°F | Ht 73.0 in | Wt 208.2 lb

## 2022-11-18 DIAGNOSIS — M159 Polyosteoarthritis, unspecified: Secondary | ICD-10-CM

## 2022-11-18 DIAGNOSIS — J310 Chronic rhinitis: Secondary | ICD-10-CM

## 2022-11-18 DIAGNOSIS — F17200 Nicotine dependence, unspecified, uncomplicated: Secondary | ICD-10-CM | POA: Diagnosis not present

## 2022-11-18 DIAGNOSIS — F17209 Nicotine dependence, unspecified, with unspecified nicotine-induced disorders: Secondary | ICD-10-CM

## 2022-11-18 DIAGNOSIS — H6993 Unspecified Eustachian tube disorder, bilateral: Secondary | ICD-10-CM | POA: Diagnosis not present

## 2022-11-18 DIAGNOSIS — Z716 Tobacco abuse counseling: Secondary | ICD-10-CM | POA: Diagnosis not present

## 2022-11-18 DIAGNOSIS — E663 Overweight: Secondary | ICD-10-CM | POA: Diagnosis not present

## 2022-11-18 DIAGNOSIS — Z86010 Personal history of colonic polyps: Secondary | ICD-10-CM | POA: Diagnosis not present

## 2022-11-18 DIAGNOSIS — H9193 Unspecified hearing loss, bilateral: Secondary | ICD-10-CM

## 2022-11-18 DIAGNOSIS — M5416 Radiculopathy, lumbar region: Secondary | ICD-10-CM

## 2022-11-18 DIAGNOSIS — Z119 Encounter for screening for infectious and parasitic diseases, unspecified: Secondary | ICD-10-CM | POA: Diagnosis not present

## 2022-11-18 DIAGNOSIS — M6530 Trigger finger, unspecified finger: Secondary | ICD-10-CM

## 2022-11-18 DIAGNOSIS — E669 Obesity, unspecified: Secondary | ICD-10-CM | POA: Insufficient documentation

## 2022-11-18 MED ORDER — NICOTINE POLACRILEX 2 MG MT LOZG: 2.0000 mg | LOZENGE | OROMUCOSAL | 5 refills | Status: DC | PRN

## 2022-11-18 MED ORDER — BUPROPION HCL ER (SR) 100 MG PO TB12: 100.0000 mg | ORAL_TABLET | Freq: Every morning | ORAL | 3 refills | Status: DC

## 2022-11-18 NOTE — Assessment & Plan Note (Signed)
Discussed nicotine replacement therapy all formulations, Wellbutrin, and Chantix Would like to start with Wellbutrin plus nicotine replacement therapy= he will use Zyn products

## 2022-11-18 NOTE — Assessment & Plan Note (Signed)
Encouraged patient to to try to slowly shift to Flonase + nightly simply saline

## 2022-11-18 NOTE — Assessment & Plan Note (Addendum)
Following with ENT.  This shows  how the eustachian canal works to drain the inner ear and how it is connected to the nasopharynx.  Nasal sinus rinses with saline nasal mist sprays can rinse all of the pollen and allergens and other irritants and pathogens out of his sinuses and nasopharynx, reducing the plugging/swelling around the eustachian tube.  The sinus rinse clears away the mucus and allows nasal steroid sprays to help reach the eustachian tube and reduce swellling to open it up.    Basic Sinus Care: Mist each nostril nightly with sterile saline nasal mist, then spray each nostril immediately after with fluticasone nasal spray (Flonase) or other steroid or antihistamine nasal pray Next, if symptoms persist, add a daily allergy pill.  Benadryl is the strongest but will make you very drowsy so only take when you can sleep after.  Ear Pressure Relief Maneuvers: Step 1 If the congestion is mild, you can often use simple maneuvers to quickly alter the pressure in your middle ear, such as: Swallowing Yawning Chewing gum Sucking on hard candy Similar methods can be used on children. If traveling with an infant or toddler, try giving them a bottle, pacifier, or something to drink or suck on.  Warm compress: Applying a warm, moist cloth to the back of your ear can help reduce swelling and help drain congested passages. In some cases, these interventions will cause the ears will pop without trying. If they don't, give it 20 minutes and see if swallowing, yawning, chewing gum, or sucking on hard candy helps.  Step 2 If these methods alone don't help, you can try other interventions like:  Decongestants: OTC drugs like Afrin (oxymetazoline) or Sudafed (pseudoephedrine) work by reducing the swelling of blood vessels in the nasal passages and Eustachian tubes.  Never use these medications for more than a few days at a time, especially afrin is dependency-forming  If this isn't working or you need  more than a few days of afrin or sudafed... you should make an appointment.   Step 3 (just for mod severe ear pressure and pain) If these interventions don't help, there are three other advanced strategies you can try called the Valsalva maneuver, the Toynbee maneuver, and the Frenzel maneuver.  Advanced Strategy 1:  The Valsalva maneuver NEVER DO THIS IT WILL DAMAGE YOUR EARDRUM Inhale. Pinch your nose shut with your fingers. Keeping your lips tightly shut, blow out forcefully as if you are blowing up a balloon. To increase the pressure, try bearing down as if having a bowel movement.  Advanced Strategy 2:  The Toynbee maneuver The Toynbee maneuver may also be safer than the Valsalva maneuver if you've had a previous eardrum injury. The Valsalva method exerts much more pressure on the eardrum and can possibly cause a rupture if you blow too forcefully.  Keep your mouth tightly shut. Pinch your nose shut with your fingers. Swallow hard.  Advanced Strategy 3: The Frenzel maneuver Pinch your nose shut with your fingers Close your mouth and place the tip of your tongue behind your upper front teeth. Push the back of your tongue to the roof of your mouth as if making a hard "G" or "K" sound. The back of your tongue will touch the roof. While doing this, close your vocal folds at the back of your throat and lift your larynx (voice box) up to push the air out of your mouth and into your nose.  ------------------------------------------------------------------------ If all this fails despite extensive efforts  then you need to go to an ear nose and throat for surgical correction - but this should not be tried until everything else fails.

## 2022-11-18 NOTE — Patient Instructions (Signed)
Welcome aboard!   Today's visit was a valuable first step in understanding your health and starting your personalized care journey. We discussed your medical history and medications in detail. Given the extensive information, we prioritized addressing your most pressing concerns.  We understood those concerns to be:  Transfer of care and Obesity   Building a Complete Picture  To create the most effective care plan possible, we may need additional information from previous providers. We encouraged you to gather any relevant medical records for your next visit. This will help [place] build a more complete picture and develop a personalized plan together. In the meantime, we'll address your immediate concerns and provide resources to help you manage all of your medical issues.  We encourage you to use MyChart to review these efforts, and to help [place] find and correct any omissions or errors in your medical chart.  Managing Your Health Over Time  Managing every aspect of your health in a single visit isn't always feasible, but that's okay.  We addressed your most pressing concerns today and charted a course for future care. Acute conditions or preventive care measures may require further attention.  We encourage you to schedule a follow-up visit at your earliest convenience to discuss any unresolved issues.  We strongly encourage participation in annual preventive care visits to help [place] develop a more thorough understanding of your health and to help you maintain optimal wellness - please inquire about scheduling your next one with [place] at your earliest convenience.  Your Satisfaction Matters  It was a pleasure seeing you today!  Your health and satisfaction will always be my top priorities. If you believe your experience today was worthy of a 5-star rating, I'd be grateful for your feedback!  Joseph Olszewski, MD   Next Steps  Schedule Follow-Up:  We recommend a follow-up appointment in No follow-ups on  file. If your condition worsens before then, please call us or seek emergency care. Preventive Care:  Don't forget to schedule your annual preventive care visit!  This important checkup is typically covered by insurance and helps identify potential health issues early.  Typically its 100% insurance covered with no co-pay and helps to get surveillance labwork paid for through your insurance provider.  Sometimes it even lowers your insurance premiums to participate. Medical Information Release:  For any relevant medical information we don't have, please sign a release form so we can obtain it for your records. Lab & X-ray Appointments:  Scheduled any incomplete lab tests today or call us to schedule.  X-Rays can be done without an appointment at [facility] at [facility] ([address], [address]), M-F 8:30am-noon or 1pm-5pm.  Just tell them you're there for X-rays ordered by Dr. Jon Billings.  We'll receive the results and contact you by phone or MyChart to discuss next steps.  Bring to Your Next Appointment  Medications: Please bring all your medication bottles to your next appointment to ensure we have an accurate record of your prescriptions. Health Diaries: If you're monitoring any health conditions at home, keeping a diary of your readings can be very helpful for discussions at your next appointment.  Reviewing Your Records  Please Review this early draft of your clinical notes below and the final encounter summary tomorrow on MyChart after its been completed.   Screening examination for infectious disease -     Hepatitis C antibody; Future  Smoker Assessment & Plan: Discussed nicotine replacement therapy all formulations, Wellbutrin, and Chantix Would  like to start with Wellbutrin plus nicotine replacement therapy= he will use Zyn products   Orders: -     Nicotine Polacrilex; Take 1 lozenge (2 mg total) by mouth as needed for smoking cessation.  Dispense: 108 tablet; Refill: 5 -      buPROPion HCl ER (SR); Take 1 tablet (100 mg total) by mouth in the morning.  Dispense: 90 tablet; Refill: 3  Rhinitis medicamentosa Assessment & Plan: Encouraged patient to to try to slowly shift to Flonase + nightly simply saline    Dysfunction of both eustachian tubes Assessment & Plan: Following with ENT.  This shows  how the eustachian canal works to drain the inner ear and how it is connected to the nasopharynx.  Nasal sinus rinses with saline nasal mist sprays can rinse all of the pollen and allergens and other irritants and pathogens out of his sinuses and nasopharynx, reducing the plugging/swelling around the eustachian tube.  The sinus rinse clears away the mucus and allows nasal steroid sprays to help reach the eustachian tube and reduce swellling to open it up.    Basic Sinus Care: Mist each nostril nightly with sterile saline nasal mist, then spray each nostril immediately after with fluticasone nasal spray (Flonase) or other steroid or antihistamine nasal pray Next, if symptoms persist, add a daily allergy pill.  Benadryl is the strongest but will make you very drowsy so only take when you can sleep after.  Ear Pressure Relief Maneuvers: Step 1 If the congestion is mild, you can often use simple maneuvers to quickly alter the pressure in your middle ear, such as: Swallowing Yawning Chewing gum Sucking on hard candy Similar methods can be used on children. If traveling with an infant or toddler, try giving them a bottle, pacifier, or something to drink or suck on.  Warm compress: Applying a warm, moist cloth to the back of your ear can help reduce swelling and help drain congested passages. In some cases, these interventions will cause the ears will pop without trying. If they don't, give it 20 minutes and see if swallowing, yawning, chewing gum, or sucking on hard candy helps.  Step 2 If these methods alone don't help, you can try other interventions  like:  Decongestants: OTC drugs like Afrin (oxymetazoline) or Sudafed (pseudoephedrine) work by reducing the swelling of blood vessels in the nasal passages and Eustachian tubes.  Never use these medications for more than a few days at a time, especially afrin is dependency-forming  If this isn't working or you need more than a few days of afrin or sudafed... you should make an appointment.   Step 3 (just for mod severe ear pressure and pain) If these interventions don't help, there are three other advanced strategies you can try called the Valsalva maneuver, the Toynbee maneuver, and the Frenzel maneuver.  Advanced Strategy 1:  The Valsalva maneuver NEVER DO THIS IT WILL DAMAGE YOUR EARDRUM Inhale. Pinch your nose shut with your fingers. Keeping your lips tightly shut, blow out forcefully as if you are blowing up a balloon. To increase the pressure, try bearing down as if having a bowel movement.  Advanced Strategy 2:  The Toynbee maneuver The Toynbee maneuver may also be safer than the Valsalva maneuver if you've had a previous eardrum injury. The Valsalva method exerts much more pressure on the eardrum and can possibly cause a rupture if you blow too forcefully.  Keep your mouth tightly shut. Pinch your nose shut with your fingers. Swallow  hard.  Advanced Strategy 3: The Frenzel maneuver Pinch your nose shut with your fingers Close your mouth and place the tip of your tongue behind your upper front teeth. Push the back of your tongue to the roof of your mouth as if making a hard "G" or "K" sound. The back of your tongue will touch the roof. While doing this, close your vocal folds at the back of your throat and lift your larynx (voice box) up to push the air out of your mouth and into your nose.  ------------------------------------------------------------------------ If all this fails despite extensive efforts then you need to go to an ear nose and throat for surgical correction -  but this should not be tried until everything else fails.       Bilateral hearing loss, unspecified hearing loss type  Trigger finger, unspecified finger, unspecified laterality -     Ambulatory referral to Orthopedic Surgery  Obesity due to energy imbalance -     CBC with Differential/Platelet; Future -     Comprehensive metabolic panel; Future -     Lipid panel; Future  Generalized osteoarthritis -     Ambulatory referral to Sports Medicine  Lumbar radiculopathy  Tobacco use disorder, continuous -     Nicotine Polacrilex; Take 1 lozenge (2 mg total) by mouth as needed for smoking cessation.  Dispense: 108 tablet; Refill: 5  Tobacco abuse counseling -     Nicotine Polacrilex; Take 1 lozenge (2 mg total) by mouth as needed for smoking cessation.  Dispense: 108 tablet; Refill: 5  Encounter for smoking cessation counseling -     Nicotine Polacrilex; Take 1 lozenge (2 mg total) by mouth as needed for smoking cessation.  Dispense: 108 tablet; Refill: 5  History of colon polyps -     Ambulatory referral to Gastroenterology; Future  Overweight -     TSH; Future -     Hemoglobin A1c; Future     Getting Answers and Following Up  Simple Questions & Concerns: For quick questions or basic follow-up after your visit, reach [place] at [phone] or MyChart messaging. Complex Concerns: If your concern is more complex, scheduling an appointment might be best. Discuss this with the staff to find the most suitable option. Lab & Imaging Results: We'll contact you directly if results are abnormal or you don't use MyChart. Most normal results will be on MyChart within 2-3 business days, with a review message from Dr. Jon Billings. Haven't heard back in 2 weeks? Need results sooner? Contact us at [phone]. Referrals: Our referral coordinator will manage specialist referrals. The specialist's office should contact you within 2 weeks to schedule an appointment. Call us if you haven't heard from  them after 2 weeks.  Staying Connected  MyChart: Activate your MyChart for the fastest way to access results and message [place]. See the last page of this paperwork for instructions.  Billing  X-ray & Lab Orders: These are billed by separate companies. Contact the invoicing company directly for questions or concerns. Visit Charges: Discuss any billing inquiries with our administrative services team.  Feedback & Satisfaction  Share Your Experience: We strive for your satisfaction! If you have any complaints, please let Dr. Jon Billings know directly or contact our Practice Administrators, Edwena Felty or [employer], by asking at the front desk.  Scheduling Tips  Shorter Wait Times: 8 am and 1 pm appointments often have the quickest wait times. Longer Appointments: If you need more time during your visit, talk to the  front desk. Due to insurance regulations, multiple back-to-back appointments might be necessary.

## 2022-11-19 NOTE — Progress Notes (Signed)
[facility]  Phone: [phone]  New patient visit  Visit Date: 11/18/2022 Patient: Joseph Williamson   DOB: Sep 18, 1970   52 y.o. Male  MRN: [id number] PCP:  Lula Olszewski, MD  (establishing care today)  Today's Health Care Provider: Lula Olszewski, MD   Assessment & Plan  :   This initial visit focused on establishing a primary care relationship and gathering a comprehensive medical history. We addressed key concerns and prioritized next steps. For any remaining concerns, we encourage Mr.Dupree to schedule a follow-up appointment at his earliest convenience.   To further complete the medical history and optimize health maintenance, we also recommend scheduling an Annual Comprehensive Physical Exam (CPE) also known as Preventive Medicine Visit.  Tobacco use disorder, continuous -     Nicotine Polacrilex; Take 1 lozenge (2 mg total) by mouth as needed for smoking cessation.  Dispense: 108 tablet; Refill: 5  Screening examination for infectious disease -     Hepatitis C antibody; Future  Smoker Assessment & Plan: Discussed nicotine replacement therapy all formulations, Wellbutrin, and Chantix Would like to start with Wellbutrin plus nicotine replacement therapy= he will use Zyn products   Orders: -     Nicotine Polacrilex; Take 1 lozenge (2 mg total) by mouth as needed for smoking cessation.  Dispense: 108 tablet; Refill: 5 -     buPROPion HCl ER (SR); Take 1 tablet (100 mg total) by mouth in the morning.  Dispense: 90 tablet; Refill: 3  Rhinitis medicamentosa Assessment & Plan: Encouraged patient to to try to slowly shift to Flonase + nightly simply saline    Dysfunction of both eustachian tubes Assessment & Plan: Following with ENT.  This shows  how the eustachian canal works to drain the inner ear and how it is connected to the nasopharynx.  Nasal sinus rinses with saline nasal mist sprays can rinse all of the pollen and allergens and other  irritants and pathogens out of his sinuses and nasopharynx, reducing the plugging/swelling around the eustachian tube.  The sinus rinse clears away the mucus and allows nasal steroid sprays to help reach the eustachian tube and reduce swellling to open it up.    Basic Sinus Care: Mist each nostril nightly with sterile saline nasal mist, then spray each nostril immediately after with fluticasone nasal spray (Flonase) or other steroid or antihistamine nasal pray Next, if symptoms persist, add a daily allergy pill.  Benadryl is the strongest but will make you very drowsy so only take when you can sleep after.  Ear Pressure Relief Maneuvers: Step 1 If the congestion is mild, you can often use simple maneuvers to quickly alter the pressure in your middle ear, such as: Swallowing Yawning Chewing gum Sucking on hard candy Similar methods can be used on children. If traveling with an infant or toddler, try giving them a bottle, pacifier, or something to drink or suck on.  Warm compress: Applying a warm, moist cloth to the back of your ear can help reduce swelling and help drain congested passages. In some cases, these interventions will cause the ears will pop without trying. If they don't, give it 20 minutes and see if swallowing, yawning, chewing gum, or sucking on hard candy helps.  Step 2 If these methods alone don't help, you can try other interventions like:  Decongestants: OTC drugs like Afrin (oxymetazoline) or Sudafed (pseudoephedrine) work by reducing the swelling of blood vessels in the nasal passages and Eustachian tubes.  Never use  these medications for more than a few days at a time, especially afrin is dependency-forming  If this isn't working or you need more than a few days of afrin or sudafed... you should make an appointment.   Step 3 (just for mod severe ear pressure and pain) If these interventions don't help, there are three other advanced strategies you can try called the  Valsalva maneuver, the Toynbee maneuver, and the Frenzel maneuver.  Advanced Strategy 1:  The Valsalva maneuver NEVER DO THIS IT WILL DAMAGE YOUR EARDRUM Inhale. Pinch your nose shut with your fingers. Keeping your lips tightly shut, blow out forcefully as if you are blowing up a balloon. To increase the pressure, try bearing down as if having a bowel movement.  Advanced Strategy 2:  The Toynbee maneuver The Toynbee maneuver may also be safer than the Valsalva maneuver if you've had a previous eardrum injury. The Valsalva method exerts much more pressure on the eardrum and can possibly cause a rupture if you blow too forcefully.  Keep your mouth tightly shut. Pinch your nose shut with your fingers. Swallow hard.  Advanced Strategy 3: The Frenzel maneuver Pinch your nose shut with your fingers Close your mouth and place the tip of your tongue behind your upper front teeth. Push the back of your tongue to the roof of your mouth as if making a hard "G" or "K" sound. The back of your tongue will touch the roof. While doing this, close your vocal folds at the back of your throat and lift your larynx (voice box) up to push the air out of your mouth and into your nose.  ------------------------------------------------------------------------ If all this fails despite extensive efforts then you need to go to an ear nose and throat for surgical correction - but this should not be tried until everything else fails.       Bilateral hearing loss, unspecified hearing loss type  Trigger finger, unspecified finger, unspecified laterality -     Ambulatory referral to Orthopedic Surgery  Obesity due to energy imbalance -     CBC with Differential/Platelet; Future -     Comprehensive metabolic panel; Future -     Lipid panel; Future  Generalized osteoarthritis -     Ambulatory referral to Sports Medicine  Lumbar radiculopathy  Tobacco abuse counseling -     Nicotine Polacrilex; Take 1 lozenge  (2 mg total) by mouth as needed for smoking cessation.  Dispense: 108 tablet; Refill: 5  Encounter for smoking cessation counseling -     Nicotine Polacrilex; Take 1 lozenge (2 mg total) by mouth as needed for smoking cessation.  Dispense: 108 tablet; Refill: 5  History of colon polyps -     Ambulatory referral to Gastroenterology; Future  Overweight -     TSH; Future -     Hemoglobin A1c; Future    Treatment plan discussed and reviewed in detail.  Answered all patient questions and confirmed understanding and comfort with the plan. Encouraged patient to contact our office if they have any questions or concerns.         Subjective:    Joseph Williamson presents today with intent to establish care with Lula Olszewski, MD as his Primary Care Provider (PCP) going forward.  Used to following with Dr.Morrow who departed local practice  His main concern(s) / chief complaint(s) are Transfer of care, Obesity, and Nicotine Dependence   Nicotine Dependence His urge triggers include company of smokers.    Problem-oriented charting was used  to develop and update his medical history: Problem  Smoker   Wants to quit Stopped drinking 2016ish   Rhinitis Medicamentosa   Aware of afrins drawbacks   Dysfunction of Both Eustachian Tubes     Depression Screen    11/18/2022    1:36 PM 10/29/2022    2:55 PM 09/17/2021    8:09 AM 06/03/2021    4:06 PM  PHQ 2/9 Scores  PHQ - 2 Score 0 0 0 0  PHQ- 9 Score 2  0 2   No results found for any visits on 11/18/22.  The following were reviewed and entered/updated into his MEDICAL RECORD NUMBERPast Medical History:  Diagnosis Date   Arthritis    Back pain    Cluster headaches    COVID-19 07/11/2021   History of chickenpox    Hypertension    Primary hypertension 09/11/2019   Past Surgical History:  Procedure Laterality Date   NO PAST SURGERIES     none     Family History  Problem Relation Age of Onset   Arthritis Mother    Cancer Mother         Pancreactic   Colon polyps Mother    Pancreatic cancer Mother    Gout Father    Kidney Stones Father    Colon polyps Father    Stroke Father        2/2 COVID   Colon cancer Neg Hx    Diabetes Neg Hx    Kidney disease Neg Hx    Liver disease Neg Hx    Outpatient Medications Prior to Visit  Medication Sig Dispense Refill   amoxicillin-clavulanate (AUGMENTIN) 875-125 MG tablet Take 1 tablet by mouth 2 (two) times daily for 10 days. 20 tablet 0   No facility-administered medications prior to visit.    No Known Allergies Social History   Tobacco Use   Smoking status: Every Day    Packs/day: .1    Types: Cigarettes   Smokeless tobacco: Never   Tobacco comments:    smokes about 2 cigs per day  Vaping Use   Vaping Use: Never used  Substance Use Topics   Alcohol use: Not Currently    Alcohol/week: 14.0 standard drinks of alcohol    Types: 14 Cans of beer per week    Comment: quit some time ago   Drug use: No    Immunization History  Administered Date(s) Administered   Influenza,inj,Quad PF,6+ Mos 06/18/2020   PFIZER(Purple Top)SARS-COV-2 Vaccination 03/14/2020, 04/04/2020   Tdap 01/29/2021           Objective:  Physical Exam  BP 119/72 (BP Location: Left Arm, Patient Position: Sitting)   Pulse 66   Temp 98.1 F (36.7 C) (Temporal)   Ht 6\' 1"  (1.854 m)   Wt 208 lb 3.2 oz (94.4 kg)   SpO2 97%   BMI 27.47 kg/m  Wt Readings from Last 10 Encounters:  11/18/22 208 lb 3.2 oz (94.4 kg)  11/11/22 212 lb 2 oz (96.2 kg)  10/29/22 207 lb 6.4 oz (94.1 kg)  09/17/21 281 lb 12.8 oz (127.8 kg)  06/03/21 281 lb 9.6 oz (127.7 kg)  03/20/21 279 lb 12.8 oz (126.9 kg)  01/29/21 275 lb (124.7 kg)  12/22/20 280 lb (127 kg)  06/18/20 277 lb (125.6 kg)  10/11/19 281 lb (127.5 kg)   Vital signs reviewed.  Nursing notes reviewed. Weight trend reviewed. Abnormalities and problem-specific physical exam findings:  smoke odor, weight loss trend impressive weight loss  General  Appearance:  Well developed, well nourished, well-groomed, healthy-appearing male with Body mass index is 27.47 kg/m. No acute distress appreciable.   Skin: Clear and well-hydrated. Pulmonary:  Normal work of breathing at rest, no respiratory distress apparent. SpO2: 97 %  Musculoskeletal: He demonstrates smooth and coordinated movements throughout all major joints.All extremities are intact.  Neurological:  Awake, alert, oriented, and engaged.  No obvious focal neurological deficits or cognitive impairments.  Sensorium seems unclouded. Gait is smooth and coordinated Psychiatric:  Appropriate mood, pleasant and cooperative demeanor, cheerful and engaged during the exam  Results Reviewed (reviewed labs/imaging may be also be found in the assessment / plan section):    Results for orders placed or performed in visit on 11/11/22  POCT rapid strep A  Result Value Ref Range   Rapid Strep A Screen Negative Negative

## 2022-11-25 ENCOUNTER — Other Ambulatory Visit (INDEPENDENT_AMBULATORY_CARE_PROVIDER_SITE_OTHER): Payer: 59

## 2022-11-25 DIAGNOSIS — Z119 Encounter for screening for infectious and parasitic diseases, unspecified: Secondary | ICD-10-CM

## 2022-11-25 DIAGNOSIS — E669 Obesity, unspecified: Secondary | ICD-10-CM | POA: Diagnosis not present

## 2022-11-25 DIAGNOSIS — E663 Overweight: Secondary | ICD-10-CM | POA: Diagnosis not present

## 2022-11-25 LAB — COMPREHENSIVE METABOLIC PANEL
ALT: 14 U/L (ref 0–53)
AST: 15 U/L (ref 0–37)
Albumin: 4.1 g/dL (ref 3.5–5.2)
Alkaline Phosphatase: 62 U/L (ref 39–117)
BUN: 16 mg/dL (ref 6–23)
CO2: 30 mEq/L (ref 19–32)
Calcium: 9.4 mg/dL (ref 8.4–10.5)
Chloride: 104 mEq/L (ref 96–112)
Creatinine, Ser: 0.9 mg/dL (ref 0.40–1.50)
GFR: 99.86 mL/min (ref >60.00)
Glucose, Bld: 96 mg/dL (ref 70–99)
Potassium: 4.4 mEq/L (ref 3.5–5.1)
Sodium: 140 mEq/L (ref 135–145)
Total Bilirubin: 0.3 mg/dL (ref 0.2–1.2)
Total Protein: 6.8 g/dL (ref 6.0–8.3)

## 2022-11-25 LAB — CBC WITH DIFFERENTIAL/PLATELET
Basophils Absolute: 0.1 10*3/uL (ref 0.0–0.1)
Basophils Relative: 0.6 % (ref 0.0–3.0)
Eosinophils Absolute: 0.4 10*3/uL (ref 0.0–0.7)
Eosinophils Relative: 3.9 % (ref 0.0–5.0)
HCT: 39.5 % (ref 39.0–52.0)
Hemoglobin: 13.4 g/dL (ref 13.0–17.0)
Lymphocytes Relative: 34.2 % (ref 12.0–46.0)
Lymphs Abs: 3.2 10*3/uL (ref 0.7–4.0)
MCHC: 33.9 g/dL (ref 30.0–36.0)
MCV: 90.1 fl (ref 78.0–100.0)
Monocytes Absolute: 0.6 10*3/uL (ref 0.1–1.0)
Monocytes Relative: 6.4 % (ref 3.0–12.0)
Neutro Abs: 5.1 10*3/uL (ref 1.4–7.7)
Neutrophils Relative %: 54.9 % (ref 43.0–77.0)
Platelets: 230 10*3/uL (ref 150.0–400.0)
RBC: 4.39 Mil/uL (ref 4.22–5.81)
RDW: 13.1 % (ref 11.5–15.5)
WBC: 9.3 10*3/uL (ref 4.0–10.5)

## 2022-11-25 LAB — LIPID PANEL
Cholesterol: 157 mg/dL (ref 0–200)
HDL: 46.7 mg/dL (ref >39.00)
LDL Cholesterol: 100 mg/dL — ABNORMAL HIGH (ref 0–99)
NonHDL: 109.85
Total CHOL/HDL Ratio: 3
Triglycerides: 48 mg/dL (ref 0.0–149.0)
VLDL: 9.6 mg/dL (ref 0.0–40.0)

## 2022-11-25 LAB — HEMOGLOBIN A1C: Hgb A1c MFr Bld: 5.7 % (ref 4.6–6.5)

## 2022-11-25 LAB — TSH: TSH: 1.35 u[IU]/mL (ref 0.35–5.50)

## 2022-11-26 LAB — HEPATITIS C ANTIBODY: Hepatitis C Ab: NONREACTIVE

## 2022-11-27 ENCOUNTER — Encounter: Payer: Self-pay | Admitting: Internal Medicine

## 2022-11-27 DIAGNOSIS — R7303 Prediabetes: Secondary | ICD-10-CM | POA: Insufficient documentation

## 2022-11-27 DIAGNOSIS — E785 Hyperlipidemia, unspecified: Secondary | ICD-10-CM | POA: Insufficient documentation

## 2023-01-04 DIAGNOSIS — M65331 Trigger finger, right middle finger: Secondary | ICD-10-CM | POA: Diagnosis not present

## 2023-02-05 ENCOUNTER — Encounter (INDEPENDENT_AMBULATORY_CARE_PROVIDER_SITE_OTHER): Payer: 59 | Admitting: Internal Medicine

## 2023-02-05 DIAGNOSIS — M79641 Pain in right hand: Secondary | ICD-10-CM | POA: Diagnosis not present

## 2023-02-05 DIAGNOSIS — M65331 Trigger finger, right middle finger: Secondary | ICD-10-CM | POA: Diagnosis not present

## 2023-02-05 DIAGNOSIS — M5431 Sciatica, right side: Secondary | ICD-10-CM | POA: Diagnosis not present

## 2023-02-05 DIAGNOSIS — M5416 Radiculopathy, lumbar region: Secondary | ICD-10-CM

## 2023-02-05 DIAGNOSIS — M65321 Trigger finger, right index finger: Secondary | ICD-10-CM | POA: Diagnosis not present

## 2023-02-09 DIAGNOSIS — M5416 Radiculopathy, lumbar region: Secondary | ICD-10-CM | POA: Diagnosis not present

## 2023-02-09 NOTE — Telephone Encounter (Signed)
02/09/2023 MyChart Encounter Time Spent: 12 minutes  Encounter Type: MyChart message response Billing Code: [id number] (Online digital E/M service, 11-20 minutes)  Chief Complaint: Exacerbation of chronic lower back pain  Relevant History: - 52 year old male with history of lumbar radiculopathy and generalized osteoarthritis - Works as a Product/process development scientist, which involves significant physical labor - Previous referral to [facility] in 2020 for back pain management - MRI findings: Degenerative Disc Disease (DDD) - Other relevant conditions: Overweight (BMI 25.0-29.9), Prediabetes, Hyperlipidemia - Current medications: bupropion ER, nicotine polacrilex  Assessment: Patient reports worsening of chronic lower back pain, likely related to his occupation as a Product/process development scientist. Given his history of lumbar radiculopathy and generalized osteoarthritis, this exacerbation warrants further evaluation. Review of previous records from [facility] [place] and MRI findings showing Degenerative Disc Disease (DDD) provide additional context for his chronic condition. The patient's overweight status and physically demanding job may be contributing factors to his condition.  Plan: 1. Encouraged patient to to scheduled in-person appointment for comprehensive evaluation of lower back pain 2. Advised patient on conservative management strategies:    - Rest and activity modification    - Application of ice or heat as needed    - Proper body mechanics 3. Discussed potential treatment options to be explored during in-person visit:    - Physical therapy referral    - Pain management strategies, including possible medication adjustments    - Consideration of interventional procedures (e.g., epidural steroid injections)    - Lifestyle modifications and ergonomic adjustments for work    - Weight management counseling 4. Reviewed and incorporated previous records from [facility] and MRI findings into the patient's lumbar radiculopathy  problem for longitudinal management 5. Planned to address other health concerns (prediabetes, hyperlipidemia, smoking cessation) during in-person visit  Patient Education: Provided information on the importance of proper body mechanics, the impact of weight on back pain, and the potential benefits of physical therapy and lifestyle modifications. Explained the implications of Degenerative Disc Disease and its relationship to chronic back pain.  Follow-up: In-person appointment to be scheduled within the next 7-10 days. Patient instructed to contact the office if symptoms worsen before the appointment.  Time Documentation: Total time spent on encounter: 12 minutes - Review of patient's medical record, including old notes from [place] and MRI findings: 5 minutes - Composition of detailed response addressing patient's concerns: 5 minutes - Documentation of encounter, updating problem list, and planning: 2 minutes  Medical Decision Making: Moderate complexity due to the chronic nature of the condition, documented progression (DDD on MRI), potential for worsening, and the need to coordinate care with potential specialists. Longitudinal management of lumbar radiculopathy updated with historical data for comprehensive care planning.  Lula Olszewski, MD  02/09/2023 7:46 PM   [facility]:  [phone]

## 2023-02-13 ENCOUNTER — Other Ambulatory Visit: Payer: Self-pay | Admitting: Internal Medicine

## 2023-02-13 DIAGNOSIS — M5136 Other intervertebral disc degeneration, lumbar region: Secondary | ICD-10-CM

## 2023-02-13 MED ORDER — GABAPENTIN 300 MG PO CAPS: 300.0000 mg | ORAL_CAPSULE | Freq: Three times a day (TID) | ORAL | 0 refills | Status: DC

## 2023-02-13 NOTE — Progress Notes (Signed)
Gabapentin refilled based on MyChart encounter and difficulty getting in person evaluation.

## 2023-02-17 ENCOUNTER — Ambulatory Visit: Payer: 59 | Admitting: Internal Medicine

## 2023-03-16 DIAGNOSIS — M5431 Sciatica, right side: Secondary | ICD-10-CM | POA: Diagnosis not present

## 2023-03-16 DIAGNOSIS — M5416 Radiculopathy, lumbar region: Secondary | ICD-10-CM | POA: Diagnosis not present

## 2023-04-13 DIAGNOSIS — M65331 Trigger finger, right middle finger: Secondary | ICD-10-CM | POA: Diagnosis not present

## 2023-04-15 DIAGNOSIS — M5416 Radiculopathy, lumbar region: Secondary | ICD-10-CM | POA: Diagnosis not present

## 2023-04-15 DIAGNOSIS — M6281 Muscle weakness (generalized): Secondary | ICD-10-CM | POA: Diagnosis not present

## 2023-04-15 DIAGNOSIS — R293 Abnormal posture: Secondary | ICD-10-CM | POA: Diagnosis not present

## 2023-04-15 DIAGNOSIS — M2569 Stiffness of other specified joint, not elsewhere classified: Secondary | ICD-10-CM | POA: Diagnosis not present

## 2023-04-19 DIAGNOSIS — M6281 Muscle weakness (generalized): Secondary | ICD-10-CM | POA: Diagnosis not present

## 2023-04-19 DIAGNOSIS — R293 Abnormal posture: Secondary | ICD-10-CM | POA: Diagnosis not present

## 2023-04-19 DIAGNOSIS — M2569 Stiffness of other specified joint, not elsewhere classified: Secondary | ICD-10-CM | POA: Diagnosis not present

## 2023-04-19 DIAGNOSIS — M5416 Radiculopathy, lumbar region: Secondary | ICD-10-CM | POA: Diagnosis not present

## 2023-04-26 DIAGNOSIS — R293 Abnormal posture: Secondary | ICD-10-CM | POA: Diagnosis not present

## 2023-04-26 DIAGNOSIS — M6281 Muscle weakness (generalized): Secondary | ICD-10-CM | POA: Diagnosis not present

## 2023-04-26 DIAGNOSIS — M5416 Radiculopathy, lumbar region: Secondary | ICD-10-CM | POA: Diagnosis not present

## 2023-04-26 DIAGNOSIS — M2569 Stiffness of other specified joint, not elsewhere classified: Secondary | ICD-10-CM | POA: Diagnosis not present

## 2023-05-19 DIAGNOSIS — M5416 Radiculopathy, lumbar region: Secondary | ICD-10-CM | POA: Diagnosis not present

## 2023-05-19 DIAGNOSIS — M6281 Muscle weakness (generalized): Secondary | ICD-10-CM | POA: Diagnosis not present

## 2023-05-19 DIAGNOSIS — M2569 Stiffness of other specified joint, not elsewhere classified: Secondary | ICD-10-CM | POA: Diagnosis not present

## 2023-05-19 DIAGNOSIS — R293 Abnormal posture: Secondary | ICD-10-CM | POA: Diagnosis not present

## 2023-05-21 DIAGNOSIS — M6281 Muscle weakness (generalized): Secondary | ICD-10-CM | POA: Diagnosis not present

## 2023-05-21 DIAGNOSIS — R293 Abnormal posture: Secondary | ICD-10-CM | POA: Diagnosis not present

## 2023-05-21 DIAGNOSIS — M2569 Stiffness of other specified joint, not elsewhere classified: Secondary | ICD-10-CM | POA: Diagnosis not present

## 2023-05-21 DIAGNOSIS — M5416 Radiculopathy, lumbar region: Secondary | ICD-10-CM | POA: Diagnosis not present

## 2023-05-25 DIAGNOSIS — M5431 Sciatica, right side: Secondary | ICD-10-CM | POA: Diagnosis not present

## 2023-05-25 DIAGNOSIS — M5416 Radiculopathy, lumbar region: Secondary | ICD-10-CM | POA: Diagnosis not present

## 2023-06-02 DIAGNOSIS — M2569 Stiffness of other specified joint, not elsewhere classified: Secondary | ICD-10-CM | POA: Diagnosis not present

## 2023-06-02 DIAGNOSIS — M6281 Muscle weakness (generalized): Secondary | ICD-10-CM | POA: Diagnosis not present

## 2023-06-02 DIAGNOSIS — M5416 Radiculopathy, lumbar region: Secondary | ICD-10-CM | POA: Diagnosis not present

## 2023-06-02 DIAGNOSIS — R293 Abnormal posture: Secondary | ICD-10-CM | POA: Diagnosis not present

## 2023-06-16 DIAGNOSIS — M5416 Radiculopathy, lumbar region: Secondary | ICD-10-CM | POA: Diagnosis not present

## 2023-06-16 DIAGNOSIS — M6281 Muscle weakness (generalized): Secondary | ICD-10-CM | POA: Diagnosis not present

## 2023-06-16 DIAGNOSIS — R293 Abnormal posture: Secondary | ICD-10-CM | POA: Diagnosis not present

## 2023-06-16 DIAGNOSIS — M2569 Stiffness of other specified joint, not elsewhere classified: Secondary | ICD-10-CM | POA: Diagnosis not present

## 2023-06-24 DIAGNOSIS — G8929 Other chronic pain: Secondary | ICD-10-CM | POA: Diagnosis not present

## 2023-06-24 DIAGNOSIS — M5442 Lumbago with sciatica, left side: Secondary | ICD-10-CM | POA: Diagnosis not present

## 2023-07-07 DIAGNOSIS — R293 Abnormal posture: Secondary | ICD-10-CM | POA: Diagnosis not present

## 2023-07-07 DIAGNOSIS — M5416 Radiculopathy, lumbar region: Secondary | ICD-10-CM | POA: Diagnosis not present

## 2023-07-07 DIAGNOSIS — M6281 Muscle weakness (generalized): Secondary | ICD-10-CM | POA: Diagnosis not present

## 2023-07-07 DIAGNOSIS — M2569 Stiffness of other specified joint, not elsewhere classified: Secondary | ICD-10-CM | POA: Diagnosis not present

## 2023-07-12 DIAGNOSIS — M6281 Muscle weakness (generalized): Secondary | ICD-10-CM | POA: Diagnosis not present

## 2023-07-12 DIAGNOSIS — R293 Abnormal posture: Secondary | ICD-10-CM | POA: Diagnosis not present

## 2023-07-12 DIAGNOSIS — M2569 Stiffness of other specified joint, not elsewhere classified: Secondary | ICD-10-CM | POA: Diagnosis not present

## 2023-07-12 DIAGNOSIS — M5416 Radiculopathy, lumbar region: Secondary | ICD-10-CM | POA: Diagnosis not present

## 2023-07-19 DIAGNOSIS — M5416 Radiculopathy, lumbar region: Secondary | ICD-10-CM | POA: Diagnosis not present

## 2023-07-19 DIAGNOSIS — R293 Abnormal posture: Secondary | ICD-10-CM | POA: Diagnosis not present

## 2023-07-19 DIAGNOSIS — M6281 Muscle weakness (generalized): Secondary | ICD-10-CM | POA: Diagnosis not present

## 2023-07-19 DIAGNOSIS — M2569 Stiffness of other specified joint, not elsewhere classified: Secondary | ICD-10-CM | POA: Diagnosis not present

## 2023-08-03 DIAGNOSIS — R293 Abnormal posture: Secondary | ICD-10-CM | POA: Diagnosis not present

## 2023-08-03 DIAGNOSIS — M2569 Stiffness of other specified joint, not elsewhere classified: Secondary | ICD-10-CM | POA: Diagnosis not present

## 2023-08-03 DIAGNOSIS — M5416 Radiculopathy, lumbar region: Secondary | ICD-10-CM | POA: Diagnosis not present

## 2023-08-03 DIAGNOSIS — M6281 Muscle weakness (generalized): Secondary | ICD-10-CM | POA: Diagnosis not present

## 2023-08-05 DIAGNOSIS — M6281 Muscle weakness (generalized): Secondary | ICD-10-CM | POA: Diagnosis not present

## 2023-08-05 DIAGNOSIS — M2569 Stiffness of other specified joint, not elsewhere classified: Secondary | ICD-10-CM | POA: Diagnosis not present

## 2023-08-05 DIAGNOSIS — R293 Abnormal posture: Secondary | ICD-10-CM | POA: Diagnosis not present

## 2023-08-05 DIAGNOSIS — M5416 Radiculopathy, lumbar region: Secondary | ICD-10-CM | POA: Diagnosis not present

## 2023-08-10 DIAGNOSIS — G8929 Other chronic pain: Secondary | ICD-10-CM | POA: Diagnosis not present

## 2023-08-10 DIAGNOSIS — M79641 Pain in right hand: Secondary | ICD-10-CM | POA: Diagnosis not present

## 2023-08-10 DIAGNOSIS — M65331 Trigger finger, right middle finger: Secondary | ICD-10-CM | POA: Diagnosis not present

## 2023-08-16 DIAGNOSIS — M5417 Radiculopathy, lumbosacral region: Secondary | ICD-10-CM | POA: Diagnosis not present

## 2023-08-20 ENCOUNTER — Encounter: Payer: Self-pay | Admitting: Internal Medicine

## 2023-08-20 ENCOUNTER — Ambulatory Visit: Payer: Self-pay | Admitting: Internal Medicine

## 2023-08-20 ENCOUNTER — Ambulatory Visit (INDEPENDENT_AMBULATORY_CARE_PROVIDER_SITE_OTHER): Payer: 59

## 2023-08-20 ENCOUNTER — Ambulatory Visit: Payer: 59 | Admitting: Internal Medicine

## 2023-08-20 VITALS — BP 110/81 | HR 61 | Temp 98.1°F | Ht 73.0 in | Wt 226.4 lb

## 2023-08-20 DIAGNOSIS — R6889 Other general symptoms and signs: Secondary | ICD-10-CM

## 2023-08-20 DIAGNOSIS — J329 Chronic sinusitis, unspecified: Secondary | ICD-10-CM | POA: Diagnosis not present

## 2023-08-20 DIAGNOSIS — H7291 Unspecified perforation of tympanic membrane, right ear: Secondary | ICD-10-CM | POA: Diagnosis not present

## 2023-08-20 DIAGNOSIS — R0989 Other specified symptoms and signs involving the circulatory and respiratory systems: Secondary | ICD-10-CM

## 2023-08-20 DIAGNOSIS — H6693 Otitis media, unspecified, bilateral: Secondary | ICD-10-CM | POA: Diagnosis not present

## 2023-08-20 DIAGNOSIS — R051 Acute cough: Secondary | ICD-10-CM | POA: Diagnosis not present

## 2023-08-20 LAB — POC COVID19 BINAXNOW: SARS Coronavirus 2 Ag: NEGATIVE

## 2023-08-20 LAB — POCT INFLUENZA A/B
Influenza A, POC: NEGATIVE
Influenza B, POC: NEGATIVE

## 2023-08-20 MED ORDER — PSEUDOEPHEDRINE HCL ER 120 MG PO TB12: 120.0000 mg | ORAL_TABLET | Freq: Two times a day (BID) | ORAL | 0 refills | Status: DC

## 2023-08-20 MED ORDER — SIMPLY SALINE 0.9 % NA AERS: 2.0000 | INHALATION_SPRAY | NASAL | 11 refills | Status: DC

## 2023-08-20 MED ORDER — LORATADINE 10 MG PO TABS: 10.0000 mg | ORAL_TABLET | Freq: Every day | ORAL | 11 refills | Status: DC

## 2023-08-20 MED ORDER — FLUTICASONE PROPIONATE 50 MCG/ACT NA SUSP: 2.0000 | Freq: Every day | NASAL | 6 refills | Status: DC

## 2023-08-20 MED ORDER — AMOXICILLIN-POT CLAVULANATE 875-125 MG PO TABS: 1.0000 | ORAL_TABLET | Freq: Two times a day (BID) | ORAL | 1 refills | Status: DC

## 2023-08-20 NOTE — Telephone Encounter (Signed)
 Appt today

## 2023-08-20 NOTE — Telephone Encounter (Signed)
Copied from CRM [id number]. Topic: Clinical - Red Word Triage >> Aug 20, 2023  9:10 AM Sim Boast F wrote: Red Word that prompted transfer to Nurse Triage: Patient has had fever today, congestion, mucus, sore throat and cough   Chief Complaint: Cold/flu/sinus symptoms Symptoms: Sore throat, Dry cough, green phlegm, Frequency: 3 days Pertinent Negatives: Patient denies chest pain, difficulty breathing, ear aches,  Disposition: [] ED /[] Urgent Care (no appt availability in office) / [x] Appointment(In office/virtual)/ []  [facility]/ [] Home Care/ [] Refused Recommended Disposition /[] [facility]/ []  Follow-up with PCP Additional Notes: Patient called and advised that he has been feeling sick for the past 3 days.  He states fever off and on, sore throat, dry cough, green phlegm, headaches. Patient denies any known sick contacts, difficulty breathing, chest pain, or ear aches. Appointment made for today 08/20/2023 at 10:20 am with patient's PCP.  Patient is also advised of Care Advice as per protocol and advised that if anything worsens to go to the emergency room.  Patient verbalized understanding.  Reason for Disposition  Fever present > 3 days (72 hours)  Answer Assessment - Initial Assessment Questions 1. ONSET: "When did the throat start hurting?" (Hours or days ago)      2-3 days 2. SEVERITY: "How bad is the sore throat?" (Scale 1-10; mild, moderate or severe)   - MILD (1-3):  Doesn't interfere with eating or normal activities.   - MODERATE (4-7): Interferes with eating some solids and normal activities.   - SEVERE (8-10):  Excruciating pain, interferes with most normal activities.   - SEVERE WITH DYSPHAGIA (10): Can't swallow liquids, drooling.     7 3. STREP EXPOSURE: "Has there been any exposure to strep within the past week?" If Yes, ask: "What type of contact occurred?"      no 4.  VIRAL SYMPTOMS: "Are there any symptoms of a cold, such as a runny nose, cough, hoarse  voice or red eyes?"      Dry cough, post nasal drip, sinus drainage 5. FEVER: "Do you have a fever?" If Yes, ask: "What is your temperature, how was it measured, and when did it start?"     On and off fever 6. PUS ON THE TONSILS: "Is there pus on the tonsils in the back of your throat?"     "I haven't noticed" 7. OTHER SYMPTOMS: "Do you have any other symptoms?" (e.g., difficulty breathing, headache, rash)     Headache that comes and goes  Protocols used: Sore Throat-A-AH

## 2023-08-20 NOTE — Patient Instructions (Addendum)
 VISIT SUMMARY:  Today, you were seen for flu-like symptoms and ear drainage that have been troubling you for the past three days. Your symptoms include severe fatigue, body aches, mild fever, and fluid drainage from your right ear. You have a history of a perforated right eardrum, which has been causing recurrent issues. Your COVID-19 and flu tests were negative. You suspect a sinus infection, and you have been experiencing sinus pressure and 'gummy' eyes.  YOUR PLAN:  -BILATERAL ACUTE BACTERIAL OTITIS MEDIA WITH TYMPANIC MEMBRANE PERFORATION: This is an infection in both ears, with a hole in the right eardrum causing pus drainage. If untreated, it can lead to serious complications like permanent hearing loss and systemic infections. You will start on Augmentin, avoid inserting anything into your ear, and follow up with an ENT specialist for potential surgical repair. Use nasal saline rinses 2-3 times daily, Afrin for a few days, and a humidifier by your bed. Apply warm compresses to soften nasal secretions and sleep with your head elevated. A sinus x-ray has been ordered.  -BACTERIAL SINUSITIS: This is a bacterial infection in your sinuses, causing greenish nasal discharge and sinus pressure. It is connected to your ear infection and needs comprehensive treatment to prevent complications. You will take Claritin for allergies, Sudafed for sinus drainage, nasal saline rinses, and Afrin for short-term use. Augmentin has been prescribed to treat the infection.  -GENERAL HEALTH MAINTENANCE: You have successfully quit smoking since the beginning of the year, which is excellent for your overall health and will aid in the recovery of your sinus and ear conditions. Continue to avoid smoking.  INSTRUCTIONS:  Follow up with an ENT specialist next week. If you have trouble getting an ENT appointment, return to the clinic in 1-2 weeks. Go to the ER for IV antibiotics if your symptoms worsen over the  weekend.   Sinusitis and associated upper respiratory infections:    A sinus infection is soreness and swelling (inflammation) of your sinuses. Sinuses are hollow spaces in the bones around your face. They are located: Around your eyes. In the middle of your forehead. Behind your nose. In your cheekbones. Your sinuses and nasal passages are lined with a fluid called mucus. Mucus drains out of your sinuses. Swelling can trap mucus in your sinuses. This lets germs (bacteria, virus, or fungus) grow, which leads to infection. Most of the time, this condition is caused by a virus.  Chronic allergies can increase swelling in the passages of the sinuses, reducing drainage of mucous and causing infections. What are the causes? Allergies. Asthma. Germs. Things that block your nose or sinuses. Growths in the nose (nasal polyps). Chemicals or irritants in the air. A fungus. This is rare. What increases the risk? Having a weak body defense system (immune system). Doing a lot of swimming or diving. Using nasal sprays too much. Smoking. What are the signs or symptoms? The main symptoms of this condition are pain and a feeling of pressure around the sinuses. Other symptoms include: Stuffy nose (congestion). This may make it hard to breathe through your nose. Runny nose (drainage). Soreness, swelling, and warmth in the sinuses. A cough that may get worse at night. Being unable to smell and taste. Mucus that collects in the throat or the back of the nose (postnasal drip). This may cause a sore throat or bad breath. Being very tired (fatigued). A fever.   How can these infections spread into my ear?  The eustachian tube drains to the sinuses from  the mental ear, and can become clogged and infected.   How is this diagnosed? Your symptoms. Your medical history. A physical exam. Tests to find out if your condition is short-term (acute) or long-term (chronic). Your doctor may: Check your  nose for growths (polyps). Check your sinuses using a tool that has a light on one end (endoscope). Check for allergies or germs. Do imaging tests, such as an MRI or CT scan. How is this treated? The most important thing, regardless of the cause, is the sinus rinse & steroid nasal spray combo:  Saline misting nasal spray clears allergens, irritants, mucus from sinuses. Use it every time you feel the need to blow your nose and just before using the steroid nasal spray. This allows the nasal steroids to reach & open Eustachian tubes and reduce symptoms  Tilt head to the side over sink. Insert nozzle into one nostril, depressing on the textured area at the base of the nozzle so a gentle mist fills sinus passages and flows out nostrils. Repeat in other nostril. Additional Treatment for this condition depends on the cause and whether it is short-term or long-term. If caused by a virus, your symptoms should go away on their own within 10 days. You may be given medicines to relieve symptoms. They include: OTC (available over the counter without a prescription) Medicines like robitussin are great to manage multiple symptom(s)  If its COVID then there is a special medicine for that, but no medications for other specific viruses yet Medicines that treat allergies (antihistamines). Benadryl is strongest antihistamine but very drowsy.  Levocetirizine (xyzal) is the strongest non-drowsy antihistamine.  There are antihistamine eye drops  Over-the-counter pain relievers Decongestants: OTC drugs like Afrin (oxymetazoline) or Sudafed (pseudoephedrine) work by reducing the swelling of blood vessels in the nasal passages and Eustachian tubes.  Never use these medications for more than a few days at a time, especially afrin is dependency-forming. If this isn't working or you need more than a few days of afrin or sudafed... you should make an appointment.   These kinds of decongestants are less effective in infections  than in allergies.  If caused by bacteria, your doctor may wait to see if you will get better without treatment. Consider starting antibiotics only when: Symptoms Last Over 10 Days: If your symptoms persist beyond 10 days, it might be a sign of a bacterial infection. Severe Symptoms: High fever, significant facial pressure, or severe pain could indicate a need for antibiotics. Ear Pain: Pain radiating to your ears can suggest sinus involvement that might benefit from antibiotics Thick Creamy Discharge: Thick, yellow, or green mucus may be a sign of a bacterial infection, whether its discharged from the nose, the eyes, or the throat. Worsening Symptoms: If your symptoms initially improve but then worsen, it's important to get re-evaluated.  Our Approach to Your Care: Starting with Non-Antibiotics: In most cases, we can manage sinus infections with rest, over-the-counter pain relievers, and home remedies like nasal irrigation. This helps [place] avoid unnecessary antibiotics. Usually, an infection can be cleared with good sinus hygiene without the risk of antibiotics. Monitoring Your Progress: Let's keep in touch about your symptoms. If they worsen or meet the criteria listed above, please contact us right away. Completing the Course: If antibiotics become necessary, it's crucial to finish the entire prescription to ensure complete healing. Supporting Gut Health: Probiotics can be helpful alongside antibiotics to maintain healthy gut bacteria.  Yogurt is cheaper than the pills.  If caused  by growths in the nose, surgery may be needed.  Follow these instructions at home: Medicines Take, use, or apply over-the-counter and prescription medicines only as told by your doctor. These may include nasal sprays. If you were prescribed an antibiotic medicine, take it as told by your doctor. Do not stop taking it even if you start to feel better. Hydrate and humidify  Drink enough water to keep your pee (urine)  pale yellow. Use a cool mist humidifier to keep the humidity level in your home above 50%. Breathe in steam for 10-15 minutes, 3-4 times a day, or as told by your doctor. You can do this in the bathroom while a hot shower is running. Try not to spend time in cool or dry air. Decongest and rinse out your sinuses with daily SimplySaline gentle misting spray- apply any medicated sinus sprays after using this misting to blow your nose. Rest Rest as much as you can. Sleep with your head raised (elevated). Make sure you get enough sleep each night. General instructions  Put a warm, moist washcloth on your face 3-4 times a day, or as often as told by your doctor. Use nasal saline washes as often as told by your doctor. Wash your hands often with soap and water. If you cannot use soap and water, use hand sanitizer. Do not smoke. Avoid being around people who are smoking (secondhand smoke). Keep all follow-up visits. Contact a doctor if: You have a fever. Your symptoms get worse. Your symptoms do not get better within 10 days. Get help right away if: You have a very bad headache. You cannot stop vomiting. You have very bad pain or swelling around your face or eyes. You have trouble seeing. You feel confused. Your neck is stiff. You have trouble breathing. These symptoms may be an emergency. Get help right away. Call 911. Do not wait to see if the symptoms will go away. Do not drive yourself to the hospital. Summary A sinus infection is swelling of your sinuses. Sinuses are hollow spaces in the bones around your face. This condition is caused by tissues in your nose that become inflamed or swollen. This traps germs. These can lead to infection. Daily sinus rinsing can reduce your risk for these. If you were prescribed an antibiotic medicine, take it as told by your doctor. Do not stop taking it even if you start to feel better. Keep all follow-up visits.

## 2023-08-20 NOTE — Progress Notes (Signed)
 ==============================  [facility]: [phone]   -- Medical Office Visit --  Patient: Joseph Williamson      Age: 53 y.o.       Sex:  male  Date:   08/20/2023 Today's Healthcare Provider: Lula Olszewski, MD  ==============================   CHIEF COMPLAINT: Flu symptoms (Started a few days ago. Chills, no body aches, previously had fever. Has taken ibuprofen and OTC cold medicine.) and Cough (Dry but productive at times producing a greenish mucus.)  SUBJECTIVE: Background This is a 53 y.o. male who has Smoker; Rhinitis medicamentosa; Dysfunction of both eustachian tubes; Bilateral hearing loss; Generalized osteoarthritis; Lumbar radiculopathy; Chronic swimmer's ear of right side; Non-seasonal allergic rhinitis; Perforation of right tympanic membrane; Recurrent acute serous otitis media of right ear; Overweight (BMI 25.0-29.9); Trigger finger; Prediabetes; and Hyperlipidemia on their problem list.  Cough 53 year old male who presents with flu-like symptoms and ear drainage.  He began experiencing symptoms approximately three days ago, initially resembling a common cold with significant fatigue. He describes feeling 'knocked out' with low energy levels, body aches, and mild feverish sensations. His wife noted he had a fever, although he did not measure his temperature. Yesterday, his symptoms worsened, making it difficult for him to get out of bed. No shortness of breath or progression of symptoms to his lungs.  He has a history of a perforated right eardrum since a water skiing accident in his twenties. Recently, he has experienced fluid drainage from the right ear, described as 'pus-ish' and similar in appearance to nasal discharge, being greenish in color. This condition has been recurrent and has not been fully addressed due to financial and logistical challenges. He has not been following up regularly with an ear, nose, and throat specialist,  although he has had MRIs in the past to assess his ear condition.  He has been tested for COVID-19 and flu, both of which returned negative results.  He suspects his symptoms may be related to a sinus infection, similar to one his father had recently. He reports 'gummy' eyes and sinus pressure, with the sensation of a 'fishbowl head.'  He quit smoking at the end of the previous year after using a generic form of Wellbutrin. Quitting smoking was a New Year's resolution and has been successful so far.   Reviewed chart records that patient  has a past medical history of Arthritis, Back pain, Cluster headaches, COVID-19 (07/11/2021), History of chickenpox, Hypertension, and Primary hypertension (09/11/2019).  Discussed Past Medical History - Sinus infection - Ear infection - Tympanic membrane perforation  Social History - Patient quit smoking  Family History - Father had a sinus infection   Current Outpatient Medications on File Prior to Visit  Medication Sig   gabapentin (NEURONTIN) 300 MG capsule Take 1 capsule (300 mg total) by mouth 3 (three) times daily.   No current facility-administered medications on file prior to visit.   Medications Discontinued During This Encounter  Medication Reason   nicotine polacrilex (SM NICOTINE) 2 MG lozenge    buPROPion ER [facility] SR) 100 MG 12 hr tablet       Objective   Physical Exam     08/20/2023   10:32 AM 11/18/2022    1:17 PM 11/11/2022    2:29 PM  Vitals with BMI  Height 6\' 1"  6\' 1"  6\' 1"   Weight 226 lbs 6 oz 208 lbs 3 oz 212 lbs 2 oz  BMI 29.88 27.47 27.99  Systolic  110 119 122  Diastolic 81 72 68  Pulse 61 66 89   Wt Readings from Last 10 Encounters:  08/20/23 226 lb 6.4 oz (102.7 kg)  11/18/22 208 lb 3.2 oz (94.4 kg)  11/11/22 212 lb 2 oz (96.2 kg)  10/29/22 207 lb 6.4 oz (94.1 kg)  09/17/21 281 lb 12.8 oz (127.8 kg)  06/03/21 281 lb 9.6 oz (127.7 kg)  03/20/21 279 lb 12.8 oz (126.9 kg)  01/29/21 275 lb (124.7 kg)   12/22/20 280 lb (127 kg)  06/18/20 277 lb (125.6 kg)   Vital signs reviewed.  Nursing notes reviewed. Weight trend reviewed. Abnormalities and Problem-Specific physical exam findings:  Physical Exam HEENT: Right eardrum destroyed with pus pouring out. Eardrum bulging on the left side. Sinuses with significant infection.  General Appearance:  No acute distress appreciable.   Well-groomed, healthy-appearing male.  Well proportioned with no abnormal fat distribution.  Good muscle tone. Pulmonary:  Normal work of breathing at rest, no respiratory distress apparent. SpO2: 98 %  Musculoskeletal: All extremities are intact.  Neurological:  Awake, alert, oriented, and engaged.  No obvious focal neurological deficits or cognitive impairments.  Sensorium seems unclouded.   Speech is clear and coherent with logical content. Psychiatric:  Appropriate mood, pleasant and cooperative demeanor, thoughtful and engaged during the exam  Discussed: Results LABS COVID-19: negative Influenza: negative     Results for orders placed or performed in visit on 08/20/23  POCT Influenza A/B  Result Value Ref Range   Influenza A, POC Negative Negative   Influenza B, POC Negative Negative  POC COVID-19  Result Value Ref Range   SARS Coronavirus 2 Ag Negative Negative   Office Visit on 08/20/2023  Component Date Value   Influenza A, POC 08/20/2023 Negative    Influenza B, POC 08/20/2023 Negative    SARS Coronavirus 2 Ag 08/20/2023 Negative   Lab on 11/25/2022  Component Date Value   WBC 11/25/2022 9.3    RBC 11/25/2022 4.39    Hemoglobin 11/25/2022 13.4    HCT 11/25/2022 39.5    MCV 11/25/2022 90.1    MCHC 11/25/2022 33.9    RDW 11/25/2022 13.1    Platelets 11/25/2022 230.0    Neutrophils Relative % 11/25/2022 54.9    Lymphocytes Relative 11/25/2022 34.2    Monocytes Relative 11/25/2022 6.4    Eosinophils Relative 11/25/2022 3.9    Basophils Relative 11/25/2022 0.6    Neutro Abs 11/25/2022 5.1     Lymphs Abs 11/25/2022 3.2    Monocytes Absolute 11/25/2022 0.6    Eosinophils Absolute 11/25/2022 0.4    Basophils Absolute 11/25/2022 0.1    Hepatitis C Ab 11/25/2022 NON-REACTIVE    Sodium 11/25/2022 140    Potassium 11/25/2022 4.4    Chloride 11/25/2022 104    CO2 11/25/2022 30    Glucose, Bld 11/25/2022 96    BUN 11/25/2022 16    Creatinine, Ser 11/25/2022 0.90    Total Bilirubin 11/25/2022 0.3    Alkaline Phosphatase 11/25/2022 62    AST 11/25/2022 15    ALT 11/25/2022 14    Total Protein 11/25/2022 6.8    Albumin 11/25/2022 4.1    GFR 11/25/2022 99.86    Calcium 11/25/2022 9.4    Cholesterol 11/25/2022 157    Triglycerides 11/25/2022 48.0    HDL 11/25/2022 46.70    VLDL 11/25/2022 9.6    LDL Cholesterol 11/25/2022 100 (H)    Total CHOL/HDL Ratio 11/25/2022 3    NonHDL 11/25/2022 109.85  TSH 11/25/2022 1.35    Hgb A1c MFr Bld 11/25/2022 5.7   Office Visit on 11/11/2022  Component Date Value   Rapid Strep A Screen 11/11/2022 Negative   Office Visit on 10/29/2022  Component Date Value   Rapid Strep A Screen 10/29/2022 Negative   No image results found. No results found.No results found.     Assessment & Plan Acute bacterial otitis media, bilateral Assessment and Plan Bilateral Acute Bacterial Otitis Media with Tympanic Membrane Perforation Presents with a 3-day history of cold symptoms progressing to flu-like symptoms, including severe fatigue, body aches, and mild fever. Examination reveals a perforated right eardrum with pus drainage and a bulging left eardrum, indicating a bacterial infection exacerbated by the perforation. This could potentially lead to bacteremia. Untreated, this condition risks permanent hearing loss, ear pain, tinnitus, and systemic complications such as sepsis, stroke, and death. Treatment aims to relieve symptoms and prevent complications. Informed consent obtained for antibiotic treatment and potential surgical repair. Start Augmentin. Advise  against inserting anything into the ear. Refer to ENT for surgical repair. Recommend nasal saline rinses 2-3 times daily, Afrin for a few days, and using a humidifier by the bed. Advise warm compresses to soften nasal secretions and sleeping with head elevated. Instruct on proper nasal spray technique. Order sinus x-ray.  Bacterial Sinusitis Symptoms include greenish nasal discharge and sinus pressure, consistent with bacterial sinusitis. The connection between sinusitis and ear infection is explained, emphasizing comprehensive treatment to prevent further complications. Untreated sinusitis risks worsening infection and potential spread to the ear and bloodstream. Treatment aims to relieve symptoms and prevent complications. Recommend Claritin for allergies, Sudafed for sinus drainage, nasal saline rinses, and Afrin for short-term use. Prescribe Augmentin.  General Health Maintenance Successfully quit smoking since the beginning of the year, which will improve overall health and aid in the recovery of sinus and ear conditions. Encourage continued smoking cessation.  Follow-up Follow up with ENT next week. Return to clinic in 1-2 weeks if having trouble getting an ENT appointment. Go to ER for IV antibiotics if symptoms worsen over the weekend. Flu-like symptoms Suspicious for systemic spread of bacterial infection in ear. Advised patient to go to emergency room if no rapid improvement with antibiotic(s). Acute cough Likely post nasal drip from severe sinusitis. Patient expressed a preference to not move forward with auscultation of heart and lungs, feels confident issue is coming from sinuses. Tympanic membrane perforation, right Follow up with ENT next week. Return to clinic in 1-2 weeks if having trouble getting an ENT appointment. Go to ER for IV antibiotics if symptoms worsen over the weekend.  Don't put anything in the ear Sinus symptom       Orders Placed During this Encounter:   Orders  Placed This Encounter  Procedures   DG Sinuses Complete    Standing Status:   Future    Number of Occurrences:   1    Expiration Date:   02/17/2024    Reason for Exam (SYMPTOM  OR DIAGNOSIS REQUIRED):   severe sinusitis    Preferred imaging location?:   [facility]   POCT Influenza A/B   POC COVID-19    Previously tested for COVID-19:   No    Resident in a congregate (group) care setting:   Unknown    Employed in healthcare setting:   Unknown   Meds ordered this encounter  Medications   fluticasone (FLONASE) 50 MCG/ACT nasal spray    Sig: Place 2 sprays into  both nostrils daily.    Dispense:  16 g    Refill:  6   Saline (SIMPLY SALINE) 0.9 % AERS    Sig: Place 2 each into the nose as directed. Use nightly for sinus hygiene long-term.  Can also be used as many times daily as desired to assist with clearing congested sinuses.    Dispense:  127 mL    Refill:  11   loratadine (CLARITIN) 10 MG tablet    Sig: Take 1 tablet (10 mg total) by mouth daily.    Dispense:  30 tablet    Refill:  11   pseudoephedrine (SUDAFED 12 HOUR) 120 MG 12 hr tablet    Sig: Take 1 tablet (120 mg total) by mouth 2 (two) times daily.    Dispense:  20 tablet    Refill:  0   amoxicillin-clavulanate (AUGMENTIN) 875-125 MG tablet    Sig: Take 1 tablet by mouth 2 (two) times daily.    Dispense:  20 tablet    Refill:  1   Medical Decision Making: [Medical Problems Considered] 1 or more chronic illnesses with exacerbation,  progression, or side effects of treatment 1 undiagnosed new problem with uncertain prognosis 1 acute illness with systemic symptoms [Risk Management] Prescription drug management  Decision regarding minor surgery with identified patient or procedure risk factors      Strongly advised surgical intervention for sinus issues and nonhealing tympanic membrane rupture, with ENT.   This document was synthesized by artificial intelligence ([employer]bridge) using HIPAA-compliant recording  of the clinical interaction;   We discussed the use of AI scribe software for clinical note transcription with the patient, who gave verbal consent to proceed.    Additional Info: This encounter employed state-of-the-art, real-time, collaborative documentation. The patient actively reviewed and assisted in updating their electronic medical record on a shared screen, ensuring transparency and facilitating joint problem-solving for the problem list, overview, and plan. This approach promotes accurate, informed care. The treatment plan was discussed and reviewed in detail, including medication safety, potential side effects, and all patient questions. We confirmed understanding and comfort with the plan. Follow-up instructions were established, including contacting the office for any concerns, returning if symptoms worsen, persist, or new symptoms develop, and precautions for potential emergency department visits.

## 2023-08-24 ENCOUNTER — Encounter: Payer: Self-pay | Admitting: Internal Medicine

## 2023-08-25 DIAGNOSIS — H7291 Unspecified perforation of tympanic membrane, right ear: Secondary | ICD-10-CM | POA: Diagnosis not present

## 2023-08-25 DIAGNOSIS — H6504 Acute serous otitis media, recurrent, right ear: Secondary | ICD-10-CM | POA: Diagnosis not present

## 2023-08-30 NOTE — Telephone Encounter (Signed)
 Imaging results/notes reviewed by patient.

## 2023-11-19 ENCOUNTER — Encounter: Payer: Self-pay | Admitting: Internal Medicine

## 2023-11-19 ENCOUNTER — Ambulatory Visit (INDEPENDENT_AMBULATORY_CARE_PROVIDER_SITE_OTHER): Payer: 59 | Admitting: Internal Medicine

## 2023-11-19 ENCOUNTER — Ambulatory Visit: Payer: Self-pay | Admitting: Internal Medicine

## 2023-11-19 VITALS — BP 118/70 | HR 78 | Temp 98.0°F | Ht 73.0 in | Wt 221.0 lb

## 2023-11-19 DIAGNOSIS — Z87891 Personal history of nicotine dependence: Secondary | ICD-10-CM

## 2023-11-19 DIAGNOSIS — E663 Overweight: Secondary | ICD-10-CM

## 2023-11-19 DIAGNOSIS — Z125 Encounter for screening for malignant neoplasm of prostate: Secondary | ICD-10-CM | POA: Diagnosis not present

## 2023-11-19 DIAGNOSIS — L989 Disorder of the skin and subcutaneous tissue, unspecified: Secondary | ICD-10-CM | POA: Insufficient documentation

## 2023-11-19 DIAGNOSIS — Z8042 Family history of malignant neoplasm of prostate: Secondary | ICD-10-CM | POA: Insufficient documentation

## 2023-11-19 DIAGNOSIS — H6993 Unspecified Eustachian tube disorder, bilateral: Secondary | ICD-10-CM | POA: Diagnosis not present

## 2023-11-19 DIAGNOSIS — M7712 Lateral epicondylitis, left elbow: Secondary | ICD-10-CM | POA: Insufficient documentation

## 2023-11-19 DIAGNOSIS — L309 Dermatitis, unspecified: Secondary | ICD-10-CM | POA: Diagnosis not present

## 2023-11-19 DIAGNOSIS — Z0001 Encounter for general adult medical examination with abnormal findings: Secondary | ICD-10-CM | POA: Diagnosis not present

## 2023-11-19 DIAGNOSIS — Z8669 Personal history of other diseases of the nervous system and sense organs: Secondary | ICD-10-CM | POA: Insufficient documentation

## 2023-11-19 LAB — LIPID PANEL
Cholesterol: 186 mg/dL (ref 0–200)
HDL: 51.4 mg/dL
LDL Cholesterol: 122 mg/dL — ABNORMAL HIGH (ref 0–99)
NonHDL: 134.87
Total CHOL/HDL Ratio: 4
Triglycerides: 66 mg/dL (ref 0.0–149.0)
VLDL: 13.2 mg/dL (ref 0.0–40.0)

## 2023-11-19 LAB — COMPREHENSIVE METABOLIC PANEL WITH GFR
ALT: 17 U/L (ref 0–53)
AST: 19 U/L (ref 0–37)
Albumin: 4.7 g/dL (ref 3.5–5.2)
Alkaline Phosphatase: 68 U/L (ref 39–117)
BUN: 20 mg/dL (ref 6–23)
CO2: 30 meq/L (ref 19–32)
Calcium: 9.8 mg/dL (ref 8.4–10.5)
Chloride: 103 meq/L (ref 96–112)
Creatinine, Ser: 0.93 mg/dL (ref 0.40–1.50)
GFR: 95.35 mL/min
Glucose, Bld: 89 mg/dL (ref 70–99)
Potassium: 4.3 meq/L (ref 3.5–5.1)
Sodium: 141 meq/L (ref 135–145)
Total Bilirubin: 0.5 mg/dL (ref 0.2–1.2)
Total Protein: 7.1 g/dL (ref 6.0–8.3)

## 2023-11-19 LAB — PSA: PSA: 0.38 ng/mL (ref 0.10–4.00)

## 2023-11-19 LAB — HEMOGLOBIN A1C: Hgb A1c MFr Bld: 5.6 % (ref 4.6–6.5)

## 2023-11-19 MED ORDER — NYSTATIN-TRIAMCINOLONE 100000-0.1 UNIT/GM-% EX OINT: 1.0000 | TOPICAL_OINTMENT | Freq: Two times a day (BID) | CUTANEOUS | 0 refills | Status: DC

## 2023-11-19 NOTE — Assessment & Plan Note (Signed)
 Self-diagnosed tennis elbow is effectively managed with a strap. The condition is not currently bothersome and does not interfere with daily activities.

## 2023-11-19 NOTE — Patient Instructions (Addendum)
 IMPORTANT HEALTH REMINDERS: Report any new or changing skin lesions promptly Maintain recommended screening schedules Discuss any new family history of cancer at future visits Follow up on any new symptoms that persist more than two weeks     ?? Trans Fats: What You Need to Know (and How to Avoid Them) Protect Your Heart, Brain, and Overall Health  ? What Are Trans Fats? Trans fats are a type of unhealthy fat that can increase your risk of: Heart disease Stroke Type 2 diabetes Inflammation Memory problems They are artificially made through a process called hydrogenation and were once common in processed foods for better shelf life and texture.  ?? Why Should I Avoid Trans Fats? Even small amounts of trans fats can: Raise "bad" LDL cholesterol Lower "good" HDL cholesterol Cause inflammation in your blood vessels Increase your risk of heart attack or stroke There is no safe level of artificial trans fat.  ?? How to Spot Trans Fats (Even When the Label Says "0g") Food companies can legally say "0 grams trans fat" if the product contains less than 0.5 grams per serving -- but that can add up fast! Look at the ingredients list for these clues: ?? Partially hydrogenated oil ? this means trans fat is present. ? Avoid foods with "shortening" or "hydrogenated" oils.  ?? Common Foods That May Contain Trans Fats Even today, you may find trans fats in: Baked goods (cookies, cakes, pies) Microwave popcorn Crackers Margarine and shortening Fried fast foods Frozen pizza  ? Healthier Choices Choose products with 0g trans fat and no "partially hydrogenated oil" in the ingredients. Use olive oil, avocado oil, or canola oil for cooking. Eat more whole, unprocessed foods: fruits, vegetables, whole grains, and lean proteins. Choose baked over fried, and fresh over packaged.  ?? Takeaway Message Trans fats are harmful, even in small amounts. To protect your health: Read labels  carefully. Look beyond "0g trans fat" and scan for "partially hydrogenated oils." Choose whole foods and heart-healthy fats.

## 2023-11-19 NOTE — Assessment & Plan Note (Signed)
 He previously lost 100 pounds but has regained 20 pounds. He engages in regular physical activity and follows a healthy diet. Emphasized the importance of avoiding trans fats to prevent cardiovascular conditions. Encouraged continued weight loss efforts to reduce abdominal adiposity. Discussed the option of a CT scan of the heart to assess for heart disease, which costs $100 and is not covered by insurance. If heart disease is found, management would include more aggressive cholesterol medication and aspirin therapy. Order a CT scan of the heart to assess for heart disease. Perform blood work to check cholesterol, thyroid, and A1c levels. Encourage avoidance of trans fats and promote healthy eating habits.

## 2023-11-19 NOTE — Progress Notes (Signed)
 [facility] [address] [place], [place] [address] Office:  [phone]  -- Annual Preventive Medical Office Visit --  Patient:  Joseph Williamson      Age: 53 y.o.       Sex:  male  Date:   11/19/2023 Patient Care Team: Anthon Kins, MD as PCP - General (Internal Medicine) Today's Healthcare Provider: Anthon Kins, MD  ========================================= Chief complaint: Annual Exam (Pt is present for cpe he is fasting for labs today .)  Purpose of Visit: Comprehensive preventive health assessment and personalized health maintenance planning.  This encounter was conducted as a Comprehensive Physical Exam (CPE) preventive care annual visit. The patient's medical history and problem list were reviewed to inform individualized preventive care recommendations.   No problem-specific medical treatment was provided during this visit.     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 No current intervention is required. Dysfunction of both eustachian tubes Chronic eustachian tube dysfunction is exacerbated by pollen. Recommend daily saline sprays to manage symptoms. Overweight (BMI 25.0-29.9) He previously lost 100 pounds but has regained 20 pounds. He engages in regular physical activity and follows a healthy diet. Emphasized the importance of avoiding trans fats to prevent cardiovascular conditions. Encouraged continued weight loss efforts to reduce abdominal adiposity. Discussed the option of a CT scan of the heart to assess for heart disease, which costs $100 and is not covered by insurance. If heart disease is found, management would  include more aggressive cholesterol medication and aspirin therapy. Order a CT scan of the heart to assess for heart disease. Perform blood work to check cholesterol, thyroid, and A1c levels. Encourage avoidance of trans fats and promote healthy eating habits. Left tennis elbow Self-diagnosed tennis elbow is effectively managed with a strap. The condition is not currently bothersome and does not interfere with daily activities. Former smoker He is a former smoker who has quit smoking. Occasionally uses nicotine  lozenges to manage stress. Discussed the use of Zen product as an alternative, with no evidence of harm noted.  Diagnoses and all orders for this visit: Encounter for annual general medical examination with abnormal findings in adult -     Lipid panel -     Comprehensive metabolic panel with GFR -     CBC with Differential/Platelet; Future -     TSH Rfx on Abnormal to Free T4 -     Hemoglobin A1c -     CT CARDIAC SCORING (SELF PAY ONLY); Future Dermatitis -     nystatin-triamcinolone  ointment (MYCOLOG); Apply 1 Application topically 2 (two) times daily. Skin lesion Screening for malignant neoplasm of prostate -     CT CARDIAC SCORING (SELF PAY ONLY); Future FH: prostate cancer -     PSA History of perforation of tympanic membrane Dysfunction of both eustachian tubes Overweight (BMI 25.0-29.9) Left tennis elbow Former smoker   Reviewed/updated/encouraged completion: Immunization History  Administered Date(s) Administered   Influenza,inj,Quad PF,6+ Mos 06/18/2020   PFIZER(Purple Top)SARS-COV-2 Vaccination 03/14/2020, 04/04/2020   Tdap 01/29/2021   Health Maintenance Due  Topic Date Due   Pneumococcal Vaccine 63-58 Years old (1 of 2 - PCV) Never done   Zoster Vaccines- Shingrix (1 of  2) Never done   Health Maintenance  Topic Date Due   Pneumococcal Vaccine 28-12 Years old (1 of 2 - PCV) Never done   Zoster Vaccines- Shingrix (1 of 2) Never done   INFLUENZA VACCINE   01/28/2024   Colonoscopy  03/29/2024   DTaP/Tdap/Td (2 - Td or Tdap) 01/30/2031   Hepatitis C Screening  Completed   HPV VACCINES  Aged Out   Meningococcal B Vaccine  Aged Out   COVID-19 Vaccine  Discontinued   HIV Screening  Discontinued    Reviewed the following verbally with patient and provided AVS materials:  HEALTH MAINTENANCE COUNSELING AND ANTICIPATORY GUIDANCE   Preventive Measure Recommendation  Eye Exams Every 1-2 years  Dental Care Cleanings every 6 months or more, brush/floss 3x daily  Sinus Care Saline spray rinses daily  Sleep 8 hours nightly, good sleep hygiene, e-monitoring if any daytime drowsiness  Diet Fruits/vegetables/fiber/healthy fats, balance and moderation  Exercise 150 minutes weekly  Risk Behaviors Discouraged any/all high risk behaviors   CANCER SCREENING SHARED DECISION MAKING   Penile/Testicle/Scrotum Encouraged self-monitoring and reporting of genital abnormalities. Patient reports none.  Thyroid Checked and advised to palpate thyroid for nodules  Prostate Individualized risks/benefits/costs discussed   PSA RESULTS: Lab Results  Component Value Date   PSA 0.38 11/19/2023        Colon HM Colonoscopy          Upcoming     Colonoscopy (Every 10 Years) Next due on 03/29/2024    03/29/2014  COLONOSCOPY   Only the first 1 history entries have been loaded, but more history exists.                Lung Current guidelines recommend individuals aged 19 to 80 who currently smoke or formerly smoked and have a >= 20 pack-year smoking history should undergo annual screening with low-dose computed tomography (LDCT). Tobacco Use: High Risk (11/19/2023)   Patient History    Smoking Tobacco Use: Every Day    Smokeless Tobacco Use: Never    Passive Exposure: Not on file    Skin Advised regular sunscreen use. Patient denies worrisome, changing, or new skin lesions. Offered to include images in chart for surveillance. Showed patient these pictures  of melanomas for reference to educate for self-monitoring. Photographs Taken 11/19/2023 :           Other Cancers Discussed lack of screening guidelines and insurance coverage for other cancer types.          Discussed the use of AI scribe software for clinical note transcription with the patient, who gave verbal consent to proceed.  History of Present Illness 53 year old male who presents for an annual physical exam.  He has a history of back pain which has improved since receiving injections in early spring. He continues to engage in physical activities such as building and playing music, which he finds satisfying.  He is concerned about a possible tennis elbow, which he manages with a strap that alleviates the symptoms when worn. He is currently involved in physically demanding activities like building closets, which he enjoys.  He has a history of skin concerns, including moles and skin tags. He previously visited a dermatologist who did not find anything concerning. Some skin tags were unsuccessfully treated with freezing, and he is considering further evaluation.  He has a significant weight loss history, having lost 100 pounds through a weight loss program, though he has regained 20 pounds. He maintains a healthy diet  and exercises regularly, including walking and work-related physical activity.  He has a history of sinus issues and has communicated with his ENT about a procedure, though no progress has been made. He experiences issues with his eustachian tubes and uses saline sprays for management.  He quit smoking and drinking years ago but occasionally uses nicotine  lozenges to manage stress. He is currently using a product called Zen for nicotine  replacement.  He reports a change in urination flow and has a family history of prostate cancer, with his uncle currently battling the disease. He has had a colonoscopy in 2015 due to hemorrhoids and bleeding stools, with the next  one scheduled for 2025.  He has a history of ear issues, including a ruptured eardrum on the right side, which healed on its own. He reports a history of ear issues, including swelling of lymph nodes in the posterior cervical chain.    ROS A comprehensive ROS was negative for any concerning symptoms.   Completed medication reconciliation: Current Outpatient Medications on File Prior to Visit  Medication Sig   gabapentin  (NEURONTIN ) 300 MG capsule Take 1 capsule (300 mg total) by mouth 3 (three) times daily.   No current facility-administered medications on file prior to visit.   The following were reviewed and/or entered/updated into our electronic MEDICAL RECORD NUMBERPast Medical History:  Diagnosis Date   Arthritis    Back pain    Cluster headaches    COVID-19 07/11/2021   History of chickenpox    Hypertension    Perforation of right tympanic membrane 01/07/2021   Primary hypertension 09/11/2019   Recurrent acute serous otitis media of right ear 07/01/2021   Past Surgical History:  Procedure Laterality Date   NO PAST SURGERIES     none     Social History   Socioeconomic History   Marital status: Married    Spouse name: Not on file   Number of children: Not on file   Years of education: Not on file   Highest education level: GED or equivalent  Occupational History   Occupation: Event organiser: [employer]  Tobacco Use   Smoking status: Every Day    Current packs/day: 0.10    Types: Cigarettes   Smokeless tobacco: Never   Tobacco comments:    smokes about 2 cigs per day  Vaping Use   Vaping status: Never Used  Substance and Sexual Activity   Alcohol use: Not Currently    Alcohol/week: 14.0 standard drinks of alcohol    Types: 14 Cans of beer per week    Comment: quit some time ago   Drug use: No   Sexual activity: Yes  Other Topics Concern   Not on file  Social History Narrative   Not on file   Social Drivers of Health   Financial Resource  Strain: Medium Risk (10/29/2022)   Overall Financial Resource Strain (CARDIA)    Difficulty of Paying Living Expenses: Somewhat hard  Food Insecurity: No Food Insecurity (10/29/2022)   Hunger Vital Sign    Worried About Running Out of Food in the Last Year: Never true    Ran Out of Food in the Last Year: Never true  Transportation Needs: No Transportation Needs (10/29/2022)   PRAPARE - Administrator, Civil Service (Medical): No    Lack of Transportation (Non-Medical): No  Physical Activity: Sufficiently Active (10/29/2022)   Exercise Vital Sign    Days of Exercise per Week: 6 days  Minutes of Exercise per Session: 120 min  Stress: Stress Concern Present (10/29/2022)   [employer] of Occupational Health - Occupational Stress Questionnaire    Feeling of Stress : To some extent  Social Connections: Moderately Isolated (10/29/2022)   Social Connection and Isolation Panel [NHANES]    Frequency of Communication with Friends and Family: More than three times a week    Frequency of Social Gatherings with Friends and Family: Once a week    Attends Religious Services: Never    Database administrator or Organizations: No    Attends Banker Meetings: Not on file    Marital Status: Married  Intimate Partner Violence: Not on file      10/29/2022    2:43 PM  Alcohol Use Disorder Test (AUDIT)  1. How often do you have a drink containing alcohol? 0   Family History  Problem Relation Age of Onset   Arthritis Mother    Cancer Mother        Pancreactic   Colon polyps Mother    Pancreatic cancer Mother    Gout Father    Kidney Stones Father    Colon polyps Father    Stroke Father        2/2 COVID   Colon cancer Neg Hx    Diabetes Neg Hx    Kidney disease Neg Hx    Liver disease Neg Hx   No Known Allergies Social History   Substance and Sexual Activity  Sexual Activity Yes   Social History   Tobacco Use   Smoking status: Every Day    Current packs/day: 0.10     Types: Cigarettes   Smokeless tobacco: Never   Tobacco comments:    smokes about 2 cigs per day  Vaping Use   Vaping status: Never Used  Substance Use Topics   Alcohol use: Not Currently    Alcohol/week: 14.0 standard drinks of alcohol    Types: 14 Cans of beer per week    Comment: quit some time ago   Drug use: No      11/19/2023    7:57 AM  Depression screen PHQ 2/9  Decreased Interest 0  Down, Depressed, Hopeless 0  PHQ - 2 Score 0  Altered sleeping 0  Tired, decreased energy 0  Change in appetite 0  Feeling bad or failure about yourself  0  Trouble concentrating 0  Moving slowly or fidgety/restless 0  Suicidal thoughts 0  PHQ-9 Score 0  Difficult doing work/chores Not difficult at all      08/20/2023   10:41 AM  Fall Risk   Falls in the past year? 0  Number falls in past yr: 0  Injury with Fall? 0  Risk for fall due to : No Fall Risks  Follow up Falls evaluation completed     BP 118/70   Pulse 78   Temp 98 F (36.7 C) (Temporal)   Ht 6\' 1"  (1.854 m)   Wt 221 lb (100.2 kg)   SpO2 98%   BMI 29.16 kg/m  BP Readings from Last 3 Encounters:  11/19/23 118/70  08/20/23 110/81  11/18/22 119/72   Wt Readings from Last 10 Encounters:  11/19/23 221 lb (100.2 kg)  08/20/23 226 lb 6.4 oz (102.7 kg)  11/18/22 208 lb 3.2 oz (94.4 kg)  11/11/22 212 lb 2 oz (96.2 kg)  10/29/22 207 lb 6.4 oz (94.1 kg)  09/17/21 281 lb 12.8 oz (127.8 kg)  06/03/21 281 lb 9.6  oz (127.7 kg)  03/20/21 279 lb 12.8 oz (126.9 kg)  01/29/21 275 lb (124.7 kg)  12/22/20 280 lb (127 kg)  Physical Exam Physical Exam  GEN: No acute distress, resting comfortably. HEENT: Right eardrum with previous rupture, healed with scar., oropharynx clear, no thyromegaly noted, no palpable lymphadenopathy or thyroid nodules. CARDIOVASCULAR: S1 and S2 heart sounds with regular rate and rhythm, no murmurs appreciated. PULMONARY: Normal work of breathing, clear to auscultation bilaterally, no crackles,  wheezes, or rhonchi. ABDOMEN: Soft, nontender, nondistended. MSK: No edema, cyanosis, or clubbing noted. SKIN: Warm, dry, no lesions of concern observed. NEUROLOGICAL: Cranial nerves II-XII grossly intact, strength 5/5 in upper and lower extremities, reflexes symmetric and intact bilaterally. PSYCH: Normal affect and thought content, pleasant and cooperative.  Photographs Taken 11/19/2023 :              ======================================  Notes:  This document was synthesized by artificial intelligence (Abridge) using HIPAA-compliant recording of the clinical interaction;   We discussed the use of AI scribe software for clinical note transcription with the patient, who gave verbal consent to proceed.    This encounter employed state-of-the-art, real-time, collaborative documentation. The patient was empowered to actively review and assist in updating their electronic medical record on a shared monitor, ensuring transparency and improving accuracy.    Prior to and at the beginning of Comprehensive Physical Exam (CPE) preventive care annual visit appointment types  we clarify to patients "Our goal today is to focus on your preventive or annual Comprehensive Physical Exam (CPE) preventive care annual visit, which typically covers routine screenings and overall health maintenance. However, if you share any new or concerning symptoms--such as dizziness, passing out, severe pain, or anything else that may point to a more serious issue--we are both legally and ethically required to evaluate it. We cannot simply overlook or ignore such concerns, even if you later decide you don't want to discuss them, because it could jeopardize your health.  If addressing a new concern takes us  beyond the scope of the preventive visit, we may need to bill separately for that portion of care. We understand financial considerations are important, and we're happy to discuss your options if something new comes  up. However, we want to be clear that once you mention a potentially serious issue, we must investigate it; we can't ethically or legally exclude that from our records or our evaluation. Please let us  know all of your questions or worries. Together, we can decide how best to manage them and how to minimize any unexpected costs, but we want to keep you safe above all else."   This disclosure is mandated by professional ethics and legal obligations, as healthcare providers must address any substantial health concerns raised during any patient interaction and a comprehensive ROS is required by insurance companies for billing preventive-care visit type.   This disclosure ultimately discourages patients financially from reporting significant health issues.

## 2023-11-19 NOTE — Assessment & Plan Note (Signed)
 Chronic eustachian tube dysfunction is exacerbated by pollen. Recommend daily saline sprays to manage symptoms.

## 2023-11-19 NOTE — Assessment & Plan Note (Signed)
 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 No current intervention is required.

## 2023-11-19 NOTE — Assessment & Plan Note (Signed)
 He is a former smoker who has quit smoking. Occasionally uses nicotine  lozenges to manage stress. Discussed the use of Zen product as an alternative, with no evidence of harm noted.

## 2023-11-20 LAB — TSH RFX ON ABNORMAL TO FREE T4: TSH: 1.6 u[IU]/mL (ref 0.450–4.500)

## 2024-03-29 ENCOUNTER — Encounter: Payer: Self-pay | Admitting: Internal Medicine

## 2024-03-29 DIAGNOSIS — Z1211 Encounter for screening for malignant neoplasm of colon: Secondary | ICD-10-CM

## 2024-03-31 NOTE — Addendum Note (Signed)
 Addended by: Yashira Offenberger G on: 03/31/2024 07:41 PM   Modules accepted: Orders

## 2024-04-27 ENCOUNTER — Encounter: Payer: Self-pay | Admitting: Internal Medicine

## 2024-04-27 ENCOUNTER — Ambulatory Visit: Admitting: Internal Medicine

## 2024-04-27 VITALS — BP 126/80 | HR 78 | Temp 98.0°F | Ht 73.0 in | Wt 239.6 lb

## 2024-04-27 DIAGNOSIS — G8929 Other chronic pain: Secondary | ICD-10-CM | POA: Diagnosis not present

## 2024-04-27 DIAGNOSIS — K625 Hemorrhage of anus and rectum: Secondary | ICD-10-CM | POA: Diagnosis not present

## 2024-04-27 DIAGNOSIS — R29818 Other symptoms and signs involving the nervous system: Secondary | ICD-10-CM | POA: Diagnosis not present

## 2024-04-27 DIAGNOSIS — L03311 Cellulitis of abdominal wall: Secondary | ICD-10-CM | POA: Insufficient documentation

## 2024-04-27 DIAGNOSIS — M5442 Lumbago with sciatica, left side: Secondary | ICD-10-CM

## 2024-04-27 DIAGNOSIS — M5441 Lumbago with sciatica, right side: Secondary | ICD-10-CM | POA: Diagnosis not present

## 2024-04-27 MED ORDER — DOXYCYCLINE HYCLATE 100 MG PO TABS: 100.0000 mg | ORAL_TABLET | Freq: Two times a day (BID) | ORAL | 1 refills | Status: DC

## 2024-04-27 MED ORDER — GABAPENTIN 300 MG PO CAPS: 600.0000 mg | ORAL_CAPSULE | Freq: Three times a day (TID) | ORAL | 3 refills | Status: AC

## 2024-04-27 NOTE — Progress Notes (Signed)
 ==============================  [facility]: [phone]   -- Medical Office Visit --  Patient: Joseph Williamson      Age: 53 y.o.       Sex:  male  Date:   04/27/2024 Today's Healthcare Provider: Bernardino KANDICE Cone, MD  ==============================   Chief Complaint: Back Pain (Has been going on for long time now.) and Medication Refill  Discussed the use of AI scribe software for clinical note transcription with the patient, who gave verbal consent to proceed.  History of Present Illness 53 year old male with severe degenerative disc disease who presents with intractable lumbar pain and sciatica.  He experiences significant lumbar pain, described as a 'hot poker' sensation in his left buttock, with tingling and numbness radiating down his right leg to his foot. The pain is persistent and exacerbated by certain movements. He received injections in early winter, which initially provided relief, but the pain has since returned.  He recalls that his MRI showed several degenerated discs and narrowing of the spaces where the nerves exit. He has not undergone any surgical interventions yet. He tried physical therapy for several months last fall and received spinal injections, which initially helped but the pain has worsened over time. He is currently taking gabapentin  for nerve pain, which he takes three times a day when the pain is severe. He has also lost weight, which he notes helped with the pain. He uses a TENS unit for pain relief.  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  He has a history of internal hemorrhoids and reports blood in his stool. He is awaiting a colonoscopy referral to investigate this further.  Background Reviewed: Problem List: has Former  smoker; Rhinitis medicamentosa; Dysfunction of both eustachian tubes; Bilateral hearing loss; Generalized osteoarthritis; Lumbar radiculopathy; Chronic swimmer's ear of right side; Non-seasonal allergic rhinitis; Overweight (BMI 25.0-29.9); Trigger finger; Prediabetes; Hyperlipidemia; History of perforation of tympanic membrane; FH: prostate cancer; Skin lesion; Dermatitis; Left tennis elbow; Suspected sleep apnea; and Chronic bilateral low back pain with bilateral sciatica on their problem list. Past Medical History:  has a past medical history of Arthritis, Back pain, Cluster headaches, COVID-19 (07/11/2021), History of chickenpox, Hypertension, Perforation of right tympanic membrane (01/07/2021), Primary hypertension (09/11/2019), and Recurrent acute serous otitis media of right ear (07/01/2021). Past Surgical History:   has a past surgical history that includes none and No past surgeries. Social History:   reports that he has been smoking cigarettes. He has never used smokeless tobacco. He reports that he does not currently use alcohol after a past usage of about 14.0 standard drinks of alcohol per week. He reports that he does not use drugs. Family History:  family history includes Arthritis in his mother; Cancer in his mother; Colon polyps in his father and mother; Gout in his father; Kidney Stones in his father; Pancreatic cancer in his mother; Stroke in his father. Allergies:  has no known allergies.   Medication Reconciliation: Current Outpatient Medications on File Prior to Visit  Medication Sig   gabapentin  (NEURONTIN ) 300 MG capsule Take 1 capsule (300 mg total) by mouth 3 (three) times daily.   nystatin -triamcinolone  ointment (MYCOLOG) Apply 1 Application topically 2 (two) times daily.   No current facility-administered medications on file prior to visit.  There are  no discontinued medications.   Physical Exam:    04/27/2024   10:46 AM 11/19/2023    7:54 AM 08/20/2023   10:32 AM   Vitals with BMI  Height 6' 1 6' 1 6' 1  Weight 239 lbs 10 oz 221 lbs 226 lbs 6 oz  BMI 31.62 29.16 29.88  Systolic 126 118 889  Diastolic 80 70 81  Pulse 78 78 61  Vital signs reviewed.  Nursing notes reviewed. Weight trend reviewed. Physical Activity: Sufficiently Active (10/29/2022)   Exercise Vital Sign    Days of Exercise per Week: 6 days    Minutes of Exercise per Session: 120 min   General Appearance:  No acute distress appreciable.   Well-groomed, healthy-appearing male.  Well proportioned with no abnormal fat distribution.  Good muscle tone. Pulmonary:  Normal work of breathing at rest, no respiratory distress apparent. SpO2: 98 %  Musculoskeletal: All extremities are intact.  Neurological:  Awake, alert, oriented, and engaged.  No obvious focal neurological deficits or cognitive impairments.  Sensorium seems unclouded.   Speech is clear and coherent with logical content. Psychiatric:  Appropriate mood, pleasant and cooperative demeanor, thoughtful and engaged during the exam      11/19/2023    7:57 AM 08/20/2023   10:41 AM 11/18/2022    1:36 PM 10/29/2022    2:55 PM  PHQ 2/9 Scores  PHQ - 2 Score 0 0 0 0  PHQ- 9 Score 0  2    Office Visit on 11/19/2023  Component Date Value Ref Range Status   Cholesterol 11/19/2023 186  0 - 200 mg/dL Final   Triglycerides 94/76/7974 66.0  0.0 - 149.0 mg/dL Final   HDL 94/76/7974 51.40  >39.00 mg/dL Final   VLDL 94/76/7974 13.2  0.0 - 40.0 mg/dL Final   LDL Cholesterol 11/19/2023 122 (H)  0 - 99 mg/dL Final   Total CHOL/HDL Ratio 11/19/2023 4   Final   NonHDL 11/19/2023 134.87   Final   Sodium 11/19/2023 141  135 - 145 mEq/L Final   Potassium 11/19/2023 4.3  3.5 - 5.1 mEq/L Final   Chloride 11/19/2023 103  96 - 112 mEq/L Final   CO2 11/19/2023 30  19 - 32 mEq/L Final   Glucose, Bld 11/19/2023 89  70 - 99 mg/dL Final   BUN 94/76/7974 20  6 - 23 mg/dL Final   Creatinine, Ser 11/19/2023 0.93  0.40 - 1.50 mg/dL Final   Total Bilirubin  11/19/2023 0.5  0.2 - 1.2 mg/dL Final   Alkaline Phosphatase 11/19/2023 68  39 - 117 U/L Final   AST 11/19/2023 19  0 - 37 U/L Final   ALT 11/19/2023 17  0 - 53 U/L Final   Total Protein 11/19/2023 7.1  6.0 - 8.3 g/dL Final   Albumin 94/76/7974 4.7  3.5 - 5.2 g/dL Final   GFR 94/76/7974 95.35  >60.00 mL/min Final   Calcium 11/19/2023 9.8  8.4 - 10.5 mg/dL Final   TSH 94/76/7974 1.600  0.450 - 4.500 uIU/mL Final   Hgb A1c MFr Bld 11/19/2023 5.6  4.6 - 6.5 % Final   PSA 11/19/2023 0.38  0.10 - 4.00 ng/mL Final  Office Visit on 08/20/2023  Component Date Value Ref Range Status   Influenza A, POC 08/20/2023 Negative  Negative Final   Influenza B, POC 08/20/2023 Negative  Negative Final   SARS Coronavirus 2 Ag 08/20/2023 Negative  Negative Final  Lab on 11/25/2022  Component Date Value Ref Range Status  WBC 11/25/2022 9.3  4.0 - 10.5 K/uL Final   RBC 11/25/2022 4.39  4.22 - 5.81 Mil/uL Final   Hemoglobin 11/25/2022 13.4  13.0 - 17.0 g/dL Final   HCT 94/70/7975 39.5  39.0 - 52.0 % Final   MCV 11/25/2022 90.1  78.0 - 100.0 fl Final   MCHC 11/25/2022 33.9  30.0 - 36.0 g/dL Final   RDW 94/70/7975 13.1  11.5 - 15.5 % Final   Platelets 11/25/2022 230.0  150.0 - 400.0 K/uL Final   Neutrophils Relative % 11/25/2022 54.9  43.0 - 77.0 % Final   Lymphocytes Relative 11/25/2022 34.2  12.0 - 46.0 % Final   Monocytes Relative 11/25/2022 6.4  3.0 - 12.0 % Final   Eosinophils Relative 11/25/2022 3.9  0.0 - 5.0 % Final   Basophils Relative 11/25/2022 0.6  0.0 - 3.0 % Final   Neutro Abs 11/25/2022 5.1  1.4 - 7.7 K/uL Final   Lymphs Abs 11/25/2022 3.2  0.7 - 4.0 K/uL Final   Monocytes Absolute 11/25/2022 0.6  0.1 - 1.0 K/uL Final   Eosinophils Absolute 11/25/2022 0.4  0.0 - 0.7 K/uL Final   Basophils Absolute 11/25/2022 0.1  0.0 - 0.1 K/uL Final   Hepatitis C Ab 11/25/2022 NON-REACTIVE  NON-REACTIVE Final   Sodium 11/25/2022 140  135 - 145 mEq/L Final   Potassium 11/25/2022 4.4  3.5 - 5.1 mEq/L Final    Chloride 11/25/2022 104  96 - 112 mEq/L Final   CO2 11/25/2022 30  19 - 32 mEq/L Final   Glucose, Bld 11/25/2022 96  70 - 99 mg/dL Final   BUN 94/70/7975 16  6 - 23 mg/dL Final   Creatinine, Ser 11/25/2022 0.90  0.40 - 1.50 mg/dL Final   Total Bilirubin 11/25/2022 0.3  0.2 - 1.2 mg/dL Final   Alkaline Phosphatase 11/25/2022 62  39 - 117 U/L Final   AST 11/25/2022 15  0 - 37 U/L Final   ALT 11/25/2022 14  0 - 53 U/L Final   Total Protein 11/25/2022 6.8  6.0 - 8.3 g/dL Final   Albumin 94/70/7975 4.1  3.5 - 5.2 g/dL Final   GFR 94/70/7975 99.86  >60.00 mL/min Final   Calcium 11/25/2022 9.4  8.4 - 10.5 mg/dL Final   Cholesterol 94/70/7975 157  0 - 200 mg/dL Final   Triglycerides 94/70/7975 48.0  0.0 - 149.0 mg/dL Final   HDL 94/70/7975 46.70  >39.00 mg/dL Final   VLDL 94/70/7975 9.6  0.0 - 59.9 mg/dL Final   LDL Cholesterol 11/25/2022 100 (H)  0 - 99 mg/dL Final   Total CHOL/HDL Ratio 11/25/2022 3   Final   NonHDL 11/25/2022 109.85   Final   TSH 11/25/2022 1.35  0.35 - 5.50 uIU/mL Final   Hgb A1c MFr Bld 11/25/2022 5.7  4.6 - 6.5 % Final  Office Visit on 11/11/2022  Component Date Value Ref Range Status   Rapid Strep A Screen 11/11/2022 Negative  Negative Final  Office Visit on 10/29/2022  Component Date Value Ref Range Status   Rapid Strep A Screen 10/29/2022 Negative  Negative Final  No image results found. No results found.       ASSESSMENT & PLAN   Assessment & Plan Chronic bilateral low back pain with bilateral sciatica Degenerative lumbar disc disease with severe neuroforaminal narrowing and right-sided lumbago with sciatica   He experiences chronic back pain with severe neuroforaminal narrowing and right-sided lumbago with sciatica, accompanied by pain in the left buttock and tingling and numbness  down the right leg, indicating nerve root compression. A previous MRI from 2020 showed severe disc degeneration. He is interested in non-fusion options. Order an MRI for  intractable lumbar pain and sciatica without contrast. Increase gabapentin  to 600 mg tid. Refer to a spine surgeon for evaluation. Consider a spinal stimulator if the MRI indicates candidacy. Avoid chiropractic interventions. Suspected sleep apnea Suspected sleep apnea based on symptoms and potential impact on overall health. Discussed the benefits of diagnosis and treatment, including improved quality of life and potential impact on surgical outcomes. Order a sleep study to evaluate for sleep apnea. Cellulitis of abdominal wall minal wall, possible tick bite   A recurrent lesion on the abdominal wall, possibly due to a tick bite, appears infected, suggesting cellulitis. Discussed the possibility of Lyme disease and other tick-borne infections. Prescribe doxycycline  for cellulitis and provide refills for future tick bites. BRBPR (bright red blood per rectum) Suspected internal hemorrhoids with rectal bleeding   He reports rectal bleeding with internal hemorrhoids and blood in stool, necessitating further evaluation to rule out other causes. Check the status of the colonoscopy referral and add a note to expedite scheduling due to increased rectal bleeding.  ORDER ASSOCIATIONS  #   DIAGNOSIS / CONDITION ICD-10 ENCOUNTER ORDER     ICD-10-CM   1. Chronic bilateral low back pain with bilateral sciatica  M54.42 gabapentin  (NEURONTIN ) 300 MG capsule   M54.41 MR LUMBAR SPINE WO CONTRAST   G89.29     2. Suspected sleep apnea  R29.818 Ambulatory referral to Sleep Studies    3. Cellulitis of abdominal wall  L03.311 doxycycline  (VIBRA -TABS) 100 MG tablet          Orders Placed in Encounter:   Imaging Orders         MR LUMBAR SPINE WO CONTRAST     Referral Orders         Ambulatory referral to Sleep Studies     Meds ordered this encounter  Medications   gabapentin  (NEURONTIN ) 300 MG capsule    Sig: Take 2 capsules (600 mg total) by mouth 3 (three) times daily.    Dispense:  540 capsule     Refill:  3   doxycycline  (VIBRA -TABS) 100 MG tablet    Sig: Take 1 tablet (100 mg total) by mouth 2 (two) times daily.    Dispense:  20 tablet    Refill:  1    Orders Placed This Encounter  Procedures   MR LUMBAR SPINE WO CONTRAST    Tried physical therapy 2 months and spinal injections but pain has worsened.  Known severe degenerative disk disease  from 2020 MRI- suspect need neurosurgical intervention.    Standing Status:   Future    Expiration Date:   04/27/2025    What is the patient's sedation requirement?:   No Sedation    Does the patient have a pacemaker or implanted devices?:   No    Preferred imaging location?:   [facility] (table limit-550lbs)   Ambulatory referral to Sleep Studies    Referral Priority:   Routine    Referral Type:   Consultation    Referral Reason:   Specialty Services Required    Number of Visits Requested:   1       This document was synthesized by artificial intelligence ([employer]bridge) using HIPAA-compliant recording of the clinical interaction;   We discussed the use of AI scribe software for clinical note transcription with the patient, who gave verbal consent to  proceed. additional Info: This encounter employed state-of-the-art, real-time, collaborative documentation. The patient actively reviewed and assisted in updating their electronic medical record on a shared screen, ensuring transparency and facilitating joint problem-solving for the problem list, overview, and plan. This approach promotes accurate, informed care. The treatment plan was discussed and reviewed in detail, including medication safety, potential side effects, and all patient questions. We confirmed understanding and comfort with the plan. Follow-up instructions were established, including contacting the office for any concerns, returning if symptoms worsen, persist, or new symptoms develop, and precautions for potential emergency department visits.

## 2024-04-27 NOTE — Assessment & Plan Note (Signed)
 Suspected internal hemorrhoids with rectal bleeding   He reports rectal bleeding with internal hemorrhoids and blood in stool, necessitating further evaluation to rule out other causes. Check the status of the colonoscopy referral and add a note to expedite scheduling due to increased rectal bleeding.

## 2024-04-27 NOTE — Assessment & Plan Note (Signed)
 Degenerative lumbar disc disease with severe neuroforaminal narrowing and right-sided lumbago with sciatica   He experiences chronic back pain with severe neuroforaminal narrowing and right-sided lumbago with sciatica, accompanied by pain in the left buttock and tingling and numbness down the right leg, indicating nerve root compression. A previous MRI from 2020 showed severe disc degeneration. He is interested in non-fusion options. Order an MRI for intractable lumbar pain and sciatica without contrast. Increase gabapentin  to 600 mg tid. Refer to a spine surgeon for evaluation. Consider a spinal stimulator if the MRI indicates candidacy. Avoid chiropractic interventions.

## 2024-04-27 NOTE — Assessment & Plan Note (Signed)
 minal wall, possible tick bite   A recurrent lesion on the abdominal wall, possibly due to a tick bite, appears infected, suggesting cellulitis. Discussed the possibility of Lyme disease and other tick-borne infections. Prescribe doxycycline  for cellulitis and provide refills for future tick bites.

## 2024-04-27 NOTE — Patient Instructions (Signed)
 It was a pleasure seeing you today! Your health and satisfaction are our top priorities.  Bernardino Cone, MD  VISIT SUMMARY: Today, you were seen for severe lumbar pain and sciatica, a possible tick bite infection, rectal bleeding, and suspected sleep apnea. We discussed your symptoms, reviewed your current treatments, and made plans for further evaluation and management.  YOUR PLAN: -DEGENERATIVE LUMBAR DISC DISEASE WITH SEVERE NEUROFORAMINAL NARROWING AND RIGHT-SIDED LUMBAGO WITH SCIATICA: This condition involves the degeneration of the discs in your lower back, causing severe pain and nerve compression. We will order a new MRI to assess your condition and have increased your gabapentin  dosage to 600 mg three times a day. You will be referred to a spine surgeon for further evaluation, and we may consider a spinal stimulator based on your MRI results. Please avoid chiropractic treatments.  -CELLULITIS OF ABDOMINAL WALL, POSSIBLE TICK BITE: Cellulitis is a bacterial skin infection that can occur from a tick bite. You have been prescribed doxycycline  to treat the infection, and we have provided refills for future tick bites. We also discussed the possibility of Lyme disease and other tick-borne infections.  -SUSPECTED INTERNAL HEMORRHOIDS WITH RECTAL BLEEDING: Internal hemorrhoids are swollen veins in your rectum that can cause bleeding. We are expediting your colonoscopy referral to investigate the cause of your rectal bleeding further.  -SUSPECTED SLEEP APNEA: Sleep apnea is a condition where your breathing repeatedly stops and starts during sleep. We have ordered a sleep study to diagnose this condition, which can improve your quality of life and potentially impact surgical outcomes.  INSTRUCTIONS: Please follow up with the spine surgeon after your MRI. Continue taking gabapentin  as prescribed. Use doxycycline  as directed for your abdominal infection and keep an eye on any new tick bites. Attend your  colonoscopy appointment once scheduled, and complete the sleep study to evaluate for sleep apnea.  Your Providers PCP: Cone Bernardino MATSU, MD,  [phone]) Referring Provider: Cone Bernardino MATSU, MD,  [phone])  NEXT STEPS: [x]  Early Intervention: Schedule sooner appointment, call our on-call services, or go to emergency room if there is any significant Increase in pain or discomfort New or worsening symptoms Sudden or severe changes in your health [x]  Flexible Follow-Up: We recommend a No follow-ups on file. for optimal routine care. This allows for progress monitoring and treatment adjustments. [x]  Preventive Care: Schedule your annual preventive care visit! It's typically covered by insurance and helps identify potential health issues early. [x]  Lab & X-ray Appointments: Incomplete tests scheduled today, or call to schedule. X-rays: [facility] (M-F, 8:30am-noon or 1pm-5pm). [x]  Medical Information Release: Sign a release form at front desk to obtain relevant medical information we don't have.  MAKING THE MOST OF OUR FOCUSED 20 MINUTE APPOINTMENTS: [x]   Clearly state your top concerns at the beginning of the visit to focus our discussion [x]   If you anticipate you will need more time, please inform the front desk during scheduling - we can book multiple appointments in the same week. [x]   If you have transportation problems- use our convenient video appointments or ask about transportation support. [x]   We can get down to business faster if you use MyChart to update information before the visit and submit non-urgent questions before your visit. Thank you for taking the time to provide details through MyChart.  Let our nurse know and she can import this information into your encounter documents.  Arrival and Wait Times: [x]   Arriving on time ensures that everyone receives prompt attention. [  x]  Early morning (8a) and afternoon (1p) appointments tend to have shortest  wait times. [x]   Unfortunately, we cannot delay appointments for late arrivals or hold slots during phone calls.  Getting Answers and Following Up [x]   Simple Questions & Concerns: For quick questions or basic follow-up after your visit, reach us  at [phone] or MyChart messaging. [x]   Complex Concerns: If your concern is more complex, scheduling an appointment might be best. Discuss this with the staff to find the most suitable option. [x]   Lab & Imaging Results: We'll contact you directly if results are abnormal or you don't use MyChart. Most normal results will be on MyChart within 2-3 business days, with a review message from Dr. Jesus. Haven't heard back in 2 weeks? Need results sooner? Contact us  at [phone]. [x]   Referrals: Our referral coordinator will manage specialist referrals. The specialist's office should contact you within 2 weeks to schedule an appointment. Call us  if you haven't heard from them after 2 weeks.  Staying Connected [x]   MyChart: Activate your MyChart for the fastest way to access results and message us . See the last page of this paperwork for instructions on how to activate.  Bring to Your Next Appointment [x]   Medications: Please bring all your medication bottles to your next appointment to ensure we have an accurate record of your prescriptions. [x]   Health Diaries: If you're monitoring any health conditions at home, keeping a diary of your readings can be very helpful for discussions at your next appointment.  Billing [x]   X-ray & Lab Orders: These are billed by separate companies. Contact the invoicing company directly for questions or concerns. [x]   Visit Charges: Discuss any billing inquiries with our administrative services team.  Your Satisfaction Matters [x]   Share Your Experience: We strive for your satisfaction! If you have any complaints, or preferably compliments, please let Dr. Jesus know directly or contact our Practice  Administrators, Manuelita Rubin or [employer], by asking at the front desk.   Reviewing Your Records [x]   Review this early draft of your clinical encounter notes below and the final encounter summary tomorrow on MyChart after its been completed.  All orders placed so far are visible here: Chronic bilateral low back pain with bilateral sciatica -     Gabapentin ; Take 2 capsules (600 mg total) by mouth 3 (three) times daily.  Dispense: 540 capsule; Refill: 3 -     MR LUMBAR SPINE WO CONTRAST; Future -     Ambulatory referral to Neurosurgery  Suspected sleep apnea -     Ambulatory referral to Sleep Studies  Cellulitis of abdominal wall -     Doxycycline  Hyclate; Take 1 tablet (100 mg total) by mouth 2 (two) times daily.  Dispense: 20 tablet; Refill: 1  BRBPR (bright red blood per rectum)

## 2024-04-27 NOTE — Assessment & Plan Note (Signed)
 Suspected sleep apnea based on symptoms and potential impact on overall health. Discussed the benefits of diagnosis and treatment, including improved quality of life and potential impact on surgical outcomes. Order a sleep study to evaluate for sleep apnea.

## 2024-05-01 ENCOUNTER — Encounter: Payer: Self-pay | Admitting: Internal Medicine

## 2024-05-01 ENCOUNTER — Ambulatory Visit: Payer: Self-pay | Admitting: Internal Medicine

## 2024-05-01 ENCOUNTER — Other Ambulatory Visit: Payer: Self-pay | Admitting: Physician Assistant

## 2024-05-01 ENCOUNTER — Ambulatory Visit: Admitting: Physician Assistant

## 2024-05-01 ENCOUNTER — Encounter: Payer: Self-pay | Admitting: Physician Assistant

## 2024-05-01 VITALS — BP 136/80 | HR 73 | Temp 98.8°F | Ht 73.0 in | Wt 233.0 lb

## 2024-05-01 DIAGNOSIS — R1032 Left lower quadrant pain: Secondary | ICD-10-CM

## 2024-05-01 LAB — CBC WITH DIFFERENTIAL/PLATELET
Basophils Absolute: 0.1 K/uL (ref 0.0–0.1)
Basophils Relative: 0.8 % (ref 0.0–3.0)
Eosinophils Absolute: 0.2 K/uL (ref 0.0–0.7)
Eosinophils Relative: 2.6 % (ref 0.0–5.0)
HCT: 39.6 % (ref 39.0–52.0)
Hemoglobin: 13.6 g/dL (ref 13.0–17.0)
Lymphocytes Relative: 34.9 % (ref 12.0–46.0)
Lymphs Abs: 2.8 K/uL (ref 0.7–4.0)
MCHC: 34.4 g/dL (ref 30.0–36.0)
MCV: 89 fl (ref 78.0–100.0)
Monocytes Absolute: 0.6 K/uL (ref 0.1–1.0)
Monocytes Relative: 7 % (ref 3.0–12.0)
Neutro Abs: 4.4 K/uL (ref 1.4–7.7)
Neutrophils Relative %: 54.7 % (ref 43.0–77.0)
Platelets: 187 K/uL (ref 150.0–400.0)
RBC: 4.44 Mil/uL (ref 4.22–5.81)
RDW: 12.9 % (ref 11.5–15.5)
WBC: 8.1 K/uL (ref 4.0–10.5)

## 2024-05-01 LAB — URINALYSIS, ROUTINE W REFLEX MICROSCOPIC
Bilirubin Urine: NEGATIVE
Hgb urine dipstick: NEGATIVE
Ketones, ur: NEGATIVE
Leukocytes,Ua: NEGATIVE
Nitrite: NEGATIVE
RBC / HPF: NONE SEEN (ref 0–?)
Specific Gravity, Urine: 1.02 (ref 1.000–1.030)
Total Protein, Urine: NEGATIVE
Urine Glucose: NEGATIVE
Urobilinogen, UA: 0.2 (ref 0.0–1.0)
pH: 6.5 (ref 5.0–8.0)

## 2024-05-01 LAB — LIPASE: Lipase: 20 U/L (ref 11.0–59.0)

## 2024-05-01 LAB — COMPREHENSIVE METABOLIC PANEL WITH GFR
ALT: 25 U/L (ref 0–53)
AST: 28 U/L (ref 0–37)
Albumin: 4.6 g/dL (ref 3.5–5.2)
Alkaline Phosphatase: 77 U/L (ref 39–117)
BUN: 19 mg/dL (ref 6–23)
CO2: 32 meq/L (ref 19–32)
Calcium: 9.8 mg/dL (ref 8.4–10.5)
Chloride: 104 meq/L (ref 96–112)
Creatinine, Ser: 1.26 mg/dL (ref 0.40–1.50)
GFR: 66.02 mL/min (ref >60.00)
Glucose, Bld: 80 mg/dL (ref 70–99)
Potassium: 5.3 meq/L — ABNORMAL HIGH (ref 3.5–5.1)
Sodium: 143 meq/L (ref 135–145)
Total Bilirubin: 0.3 mg/dL (ref 0.2–1.2)
Total Protein: 7 g/dL (ref 6.0–8.3)

## 2024-05-01 NOTE — Progress Notes (Signed)
 Joseph Williamson is a 53 y.o. male here for a new problem.  History of Present Illness:   Chief Complaint  Patient presents with   Abdominal Pain    Pt c/o left upper quadrant on Saturday, severe pain lasting about one hour, has subsided, but dull ache now. Denies nausea or vomiting, some chills, no fever.   Patient reports the following that happened on Saturday (two days ago):   I happen to be [place] way in the middle of nowhere in [place]. When all of a sudden I started getting a pain in my lower left abdomen of course I assumed it was potentially appendicitis by the severity of the pain, but it was in the wrong spot. It got to a point that it laid me out, and my friends were on the verge of calling 911. It's subsided and as I googled, I realize that it is most likely something that runs in my family, which I believe to be diverticulitis.  He reports that he has never had this pain before  He is currently dealing with significant chronic back pain under the care of his Primary Care Provider (PCP) and neurosurgery.  He is taking doxycycline  for a tick bite at this time, this was started about 5 days ago. Reports there is no concerns with this.  Prior colonoscopy in 2015 due to rectal bleeding. Did confirm diverticulosis in sigmoid colon. Has never had diverticulitis. He is overdue for 10 year recall.  He reports there is no any suspicious food intake. He is drinking plenty of liquids. Currently does not consume alcohol.  Pain was severe for 1.5 hours on Sat but has improved. Remains dull, achy pain. No nausea and vomiting and diarrhea/constipation. Reports there is no chest pain, shortness of breath.  Does state that over the past 6 month(s) he has noticed that he has had a difficult time getting urine out.  Past Medical History:  Diagnosis Date   Arthritis    Back pain    Cluster headaches    COVID-19 07/11/2021   History of chickenpox    Hypertension     Perforation of right tympanic membrane 01/07/2021   Primary hypertension 09/11/2019   Recurrent acute serous otitis media of right ear 07/01/2021     Social History   Tobacco Use   Smoking status: Former    Current packs/day: 0.10    Average packs/day: 0.1 packs/day for 0.8 years (0.1 ttl pk-yrs)    Types: Cigarettes    Start date: 06/30/2023   Smokeless tobacco: Never   Tobacco comments:    smokes about 2 cigs per day  Vaping Use   Vaping status: Never Used  Substance Use Topics   Alcohol use: Not Currently    Alcohol/week: 14.0 standard drinks of alcohol    Types: 14 Cans of beer per week    Comment: quit some time ago   Drug use: No    Past Surgical History:  Procedure Laterality Date   NO PAST SURGERIES     none      Family History  Problem Relation Age of Onset   Arthritis Mother    Cancer Mother        Pancreactic   Colon polyps Mother    Pancreatic cancer Mother    Gout Father    Kidney Stones Father    Colon polyps Father    Stroke Father        2/2 COVID   Colon cancer Neg  Hx    Diabetes Neg Hx    Kidney disease Neg Hx    Liver disease Neg Hx     No Known Allergies  Current Medications:   Current Outpatient Medications:    doxycycline  (VIBRA -TABS) 100 MG tablet, Take 1 tablet (100 mg total) by mouth 2 (two) times daily., Disp: 20 tablet, Rfl: 1   gabapentin  (NEURONTIN ) 300 MG capsule, Take 2 capsules (600 mg total) by mouth 3 (three) times daily., Disp: 540 capsule, Rfl: 3   nystatin -triamcinolone  ointment (MYCOLOG), Apply 1 Application topically 2 (two) times daily., Disp: 30 g, Rfl: 0   Review of Systems:   Negative unless otherwise specified per HPI.  Vitals:   Vitals:   05/01/24 1306  BP: 136/80  Pulse: 73  Temp: 98.8 F (37.1 C)  TempSrc: Temporal  SpO2: 97%  Weight: 233 lb (105.7 kg)  Height: 6' 1 (1.854 m)     Body mass index is 30.74 kg/m.  Physical Exam:   Physical Exam Vitals and nursing note reviewed.   Constitutional:      General: He is not in acute distress.    Appearance: He is well-developed. He is not ill-appearing or toxic-appearing.  HENT:     Head: Normocephalic.  Eyes:     Conjunctiva/sclera: Conjunctivae normal.     Pupils: Pupils are equal, round, and reactive to light.  Cardiovascular:     Rate and Rhythm: Normal rate and regular rhythm.     Pulses: Normal pulses.     Heart sounds: Normal heart sounds, S1 normal and S2 normal.  Pulmonary:     Effort: Pulmonary effort is normal.     Breath sounds: Normal breath sounds.  Abdominal:     General: Abdomen is flat.     Tenderness: There is abdominal tenderness in the left lower quadrant. There is no right CVA tenderness, left CVA tenderness, guarding or rebound.  Musculoskeletal:        General: Normal range of motion.     Cervical back: Normal range of motion.  Skin:    General: Skin is warm and dry.  Neurological:     Mental Status: He is alert and oriented to person, place, and time.     GCS: GCS eye subscore is 4. GCS verbal subscore is 5. GCS motor subscore is 6.  Psychiatric:        Speech: Speech normal.        Behavior: Behavior normal. Behavior is cooperative.        Thought Content: Thought content normal.        Judgment: Judgment normal.     Assessment and Plan:   LLQ pain No severe abdominal pain on exam -- no evidence of acute abdomen warranting ER evaluation; vitals signs stable Differential diagnosis includes, but is not limited to: constipation, diverticulitis, nephrolithiasis, IBS, mass, colitis, among others Will order stat CT abdomen/pelvis for further evaluation Will also order urinalysis, blood work as well He was advised that if he has any worsening of symptom(s) in the meantime, he should present to the ER I also recommend that he downgrade his diet to liquids in the interim.  Lucie Buttner, PA-C

## 2024-05-01 NOTE — Telephone Encounter (Signed)
 Appt today

## 2024-05-01 NOTE — Telephone Encounter (Signed)
 FYI Only or Action Required?: FYI only for provider: appointment scheduled on 05/01/2024.  Patient was last seen in primary care on 04/27/2024 by Jesus Bernardino MATSU, MD.  Called Nurse Triage reporting Abdominal Pain.  Symptoms began several days ago.  Triage Disposition: See Physician Within 24 Hours  Patient/caregiver understands and will follow disposition?: Yes         Copied from CRM #[id number]. Topic: Clinical - Medical Advice >> May 01, 2024 10:21 AM Joseph Williamson wrote: Reason for CRM:  He sent this message through MyChart: Good morning Dr Jesus  Just wanted to reach out and let you know of an episode I had Saturday afternoon. I happen to be deer hunting way in the middle of nowhere in [place]. When all of a sudden I started getting a pain in my lower left abdomen of course I assumed it was potentially appendicitis by the severity of the pain, but it was in the wrong spot. It got to a point that it laid me out, and my friends were on the verge of calling 911. It's subsided and as I googled, I realize that it is most likely something that runs in my family, which I believe to be diverticulitis. I hope.Just wanted to know if you would like to see me again or if we can potentially add this to what the colonoscopy may discover. Feeling a bit concerned... His pain is not bad at this time Please call him back to discuss at [phone]. Thanks Reason for Disposition  [1] MODERATE pain (e.g., interferes with normal activities) AND [2] pain comes and goes (cramps) AND [3] present > 24 hours  (Exception: Pain with Vomiting or Diarrhea - see that Guideline.)  Answer Assessment - Initial Assessment Questions This RN scheduled pt an appointment today in office.   Abdominal pain while sitting Sat afternoon Cramps in LLQ (10/10 pain level), worsened over 30 mins then went away after one hour Doesn't hurt right now but abnormal sensation in that area Denies diarrhea,  nausea  Protocols used: Abdominal Pain - Male-A-AH

## 2024-05-02 ENCOUNTER — Other Ambulatory Visit

## 2024-05-02 LAB — URINE CULTURE
MICRO NUMBER:: 17182086
Result:: NO GROWTH
SPECIMEN QUALITY:: ADEQUATE

## 2024-05-03 ENCOUNTER — Ambulatory Visit: Payer: Self-pay | Admitting: Physician Assistant

## 2024-05-03 ENCOUNTER — Encounter: Payer: Self-pay | Admitting: Radiology

## 2024-05-03 ENCOUNTER — Other Ambulatory Visit (INDEPENDENT_AMBULATORY_CARE_PROVIDER_SITE_OTHER)

## 2024-05-03 DIAGNOSIS — E875 Hyperkalemia: Secondary | ICD-10-CM | POA: Diagnosis not present

## 2024-05-03 DIAGNOSIS — Z0001 Encounter for general adult medical examination with abnormal findings: Secondary | ICD-10-CM

## 2024-05-03 LAB — CBC WITH DIFFERENTIAL/PLATELET
Basophils Absolute: 0.1 K/uL (ref 0.0–0.1)
Basophils Relative: 0.7 % (ref 0.0–3.0)
Eosinophils Absolute: 0.2 K/uL (ref 0.0–0.7)
Eosinophils Relative: 2.3 % (ref 0.0–5.0)
HCT: 41.4 % (ref 39.0–52.0)
Hemoglobin: 14.2 g/dL (ref 13.0–17.0)
Lymphocytes Relative: 33.8 % (ref 12.0–46.0)
Lymphs Abs: 2.8 K/uL (ref 0.7–4.0)
MCHC: 34.2 g/dL (ref 30.0–36.0)
MCV: 89 fl (ref 78.0–100.0)
Monocytes Absolute: 0.6 K/uL (ref 0.1–1.0)
Monocytes Relative: 7.3 % (ref 3.0–12.0)
Neutro Abs: 4.6 K/uL (ref 1.4–7.7)
Neutrophils Relative %: 55.9 % (ref 43.0–77.0)
Platelets: 187 K/uL (ref 150.0–400.0)
RBC: 4.66 Mil/uL (ref 4.22–5.81)
RDW: 13 % (ref 11.5–15.5)
WBC: 8.1 K/uL (ref 4.0–10.5)

## 2024-05-03 LAB — BASIC METABOLIC PANEL WITH GFR
BUN: 24 mg/dL — ABNORMAL HIGH (ref 6–23)
CO2: 30 meq/L (ref 19–32)
Calcium: 9.6 mg/dL (ref 8.4–10.5)
Chloride: 103 meq/L (ref 96–112)
Creatinine, Ser: 1.25 mg/dL (ref 0.40–1.50)
GFR: 66.65 mL/min (ref >60.00)
Glucose, Bld: 87 mg/dL (ref 70–99)
Potassium: 4.4 meq/L (ref 3.5–5.1)
Sodium: 141 meq/L (ref 135–145)

## 2024-05-04 ENCOUNTER — Emergency Department (HOSPITAL_BASED_OUTPATIENT_CLINIC_OR_DEPARTMENT_OTHER): Admission: EM | Admit: 2024-05-04 | Discharge: 2024-05-04 | Disposition: A | Discharge status: Home / Self Care

## 2024-05-04 ENCOUNTER — Ambulatory Visit: Payer: Self-pay | Admitting: Physician Assistant

## 2024-05-04 ENCOUNTER — Other Ambulatory Visit: Payer: Self-pay

## 2024-05-04 ENCOUNTER — Other Ambulatory Visit

## 2024-05-04 ENCOUNTER — Emergency Department (HOSPITAL_BASED_OUTPATIENT_CLINIC_OR_DEPARTMENT_OTHER)

## 2024-05-04 ENCOUNTER — Encounter (HOSPITAL_BASED_OUTPATIENT_CLINIC_OR_DEPARTMENT_OTHER): Payer: Self-pay | Admitting: Emergency Medicine

## 2024-05-04 DIAGNOSIS — K5730 Diverticulosis of large intestine without perforation or abscess without bleeding: Secondary | ICD-10-CM | POA: Diagnosis not present

## 2024-05-04 DIAGNOSIS — N12 Tubulo-interstitial nephritis, not specified as acute or chronic: Secondary | ICD-10-CM | POA: Diagnosis not present

## 2024-05-04 DIAGNOSIS — R1032 Left lower quadrant pain: Secondary | ICD-10-CM | POA: Diagnosis not present

## 2024-05-04 LAB — CBC
HCT: 40.9 % (ref 39.0–52.0)
Hemoglobin: 14.2 g/dL (ref 13.0–17.0)
MCH: 30.5 pg (ref 26.0–34.0)
MCHC: 34.7 g/dL (ref 30.0–36.0)
MCV: 87.8 fL (ref 80.0–100.0)
Platelets: 191 K/uL (ref 150–400)
RBC: 4.66 MIL/uL (ref 4.22–5.81)
RDW: 12.2 % (ref 11.5–15.5)
WBC: 12.3 K/uL — ABNORMAL HIGH (ref 4.0–10.5)
nRBC: 0 % (ref 0.0–0.2)

## 2024-05-04 LAB — COMPREHENSIVE METABOLIC PANEL WITH GFR
ALT: 25 U/L (ref 0–44)
AST: 25 U/L (ref 15–41)
Albumin: 4.7 g/dL (ref 3.5–5.0)
Alkaline Phosphatase: 86 U/L (ref 38–126)
Anion gap: 8 (ref 5–15)
BUN: 22 mg/dL — ABNORMAL HIGH (ref 6–20)
CO2: 30 mmol/L (ref 22–32)
Calcium: 10.7 mg/dL — ABNORMAL HIGH (ref 8.9–10.3)
Chloride: 102 mmol/L (ref 98–111)
Creatinine, Ser: 1.17 mg/dL (ref 0.61–1.24)
GFR, Estimated: >60 mL/min (ref >60)
Glucose, Bld: 110 mg/dL — ABNORMAL HIGH (ref 70–99)
Potassium: 4.7 mmol/L (ref 3.5–5.1)
Sodium: 140 mmol/L (ref 135–145)
Total Bilirubin: 0.4 mg/dL (ref 0.0–1.2)
Total Protein: 7.5 g/dL (ref 6.5–8.1)

## 2024-05-04 LAB — URINALYSIS, ROUTINE W REFLEX MICROSCOPIC
Bilirubin Urine: NEGATIVE
Glucose, UA: NEGATIVE mg/dL
Hgb urine dipstick: NEGATIVE
Ketones, ur: NEGATIVE mg/dL
Leukocytes,Ua: NEGATIVE
Nitrite: NEGATIVE
Protein, ur: NEGATIVE mg/dL
Specific Gravity, Urine: 1.025 (ref 1.005–1.030)
pH: 6 (ref 5.0–8.0)

## 2024-05-04 LAB — LIPASE, BLOOD: Lipase: 17 U/L (ref 11–51)

## 2024-05-04 MED ORDER — IOHEXOL 300 MG/ML  SOLN
100.0000 mL | Freq: Once | INTRAMUSCULAR | Status: AC | PRN
  Administered 2024-05-04: 100 mL via INTRAVENOUS

## 2024-05-04 MED ORDER — ONDANSETRON HCL 4 MG/2ML IJ SOLN
4.0000 mg | Freq: Once | INTRAMUSCULAR | Status: AC
  Administered 2024-05-04: 4 mg via INTRAVENOUS
  Filled 2024-05-04: qty 2

## 2024-05-04 MED ORDER — SODIUM CHLORIDE 0.9 % IV SOLN: INTRAVENOUS | Status: DC | PRN

## 2024-05-04 MED ORDER — HYDROMORPHONE HCL 1 MG/ML IJ SOLN
0.5000 mg | Freq: Once | INTRAMUSCULAR | Status: AC
  Administered 2024-05-04: 0.5 mg via INTRAVENOUS
  Filled 2024-05-04: qty 1

## 2024-05-04 MED ORDER — CEPHALEXIN 500 MG PO CAPS: 500.0000 mg | ORAL_CAPSULE | Freq: Four times a day (QID) | ORAL | 0 refills | Status: DC

## 2024-05-04 MED ORDER — SODIUM CHLORIDE 0.9 % IV SOLN
1.0000 g | Freq: Once | INTRAVENOUS | Status: AC
  Administered 2024-05-04: 1 g via INTRAVENOUS
  Filled 2024-05-04: qty 10

## 2024-05-04 NOTE — ED Provider Notes (Signed)
 [place] EMERGENCY DEPARTMENT AT [facility] Provider Note   CSN: [id number] Arrival date & time: 05/04/24  9193     Patient presents with: Abdominal Pain   Joseph Williamson is a 53 y.o. male.   53 year old male presents for evaluation of left lower quadrant abdominal pain.  States has been gone since Saturday but getting worse.  States he saw his primary care doctor on Monday was post get a CT scan today but the pain got worse overnight.  Denies any nausea vomiting or diarrhea.  Denies any other symptoms or concerns at this time.   Abdominal Pain Associated symptoms: no chest pain, no chills, no cough, no dysuria, no fever, no hematuria, no shortness of breath, no sore throat and no vomiting        Prior to Admission medications   Medication Sig Start Date End Date Taking? Authorizing Provider  cephALEXin  (KEFLEX ) 500 MG capsule Take 1 capsule (500 mg total) by mouth 4 (four) times daily for 10 days. 05/04/24 05/14/24 Yes Bently Morath L, DO  doxycycline  (VIBRA -TABS) 100 MG tablet Take 1 tablet (100 mg total) by mouth 2 (two) times daily. 04/27/24   Jesus Bernardino MATSU, MD  gabapentin  (NEURONTIN ) 300 MG capsule Take 2 capsules (600 mg total) by mouth 3 (three) times daily. 04/27/24   Jesus Bernardino MATSU, MD  nystatin -triamcinolone  ointment (MYCOLOG) Apply 1 Application topically 2 (two) times daily. 11/19/23   Jesus Bernardino MATSU, MD    Allergies: Patient has no known allergies.    Review of Systems  Constitutional:  Negative for chills and fever.  HENT:  Negative for ear pain and sore throat.   Eyes:  Negative for pain and visual disturbance.  Respiratory:  Negative for cough and shortness of breath.   Cardiovascular:  Negative for chest pain and palpitations.  Gastrointestinal:  Positive for abdominal pain. Negative for vomiting.  Genitourinary:  Negative for dysuria and hematuria.  Musculoskeletal:  Negative for arthralgias and back pain.  Skin:  Negative for color change  and rash.  Neurological:  Negative for seizures and syncope.  All other systems reviewed and are negative.   Updated Vital Signs BP (!) 152/95   Pulse (!) 57   Temp 98.1 F (36.7 C) (Oral)   Resp 16   SpO2 100%   Physical Exam Vitals and nursing note reviewed.  Constitutional:      General: He is not in acute distress.    Appearance: He is well-developed.     Comments: Uncomfortable appearing  HENT:     Head: Normocephalic and atraumatic.  Eyes:     Conjunctiva/sclera: Conjunctivae normal.  Cardiovascular:     Rate and Rhythm: Normal rate and regular rhythm.     Heart sounds: No murmur heard. Pulmonary:     Effort: Pulmonary effort is normal. No respiratory distress.     Breath sounds: Normal breath sounds.  Abdominal:     Palpations: Abdomen is soft.     Tenderness: There is abdominal tenderness in the left lower quadrant.  Musculoskeletal:        General: No swelling.     Cervical back: Neck supple.  Skin:    General: Skin is warm and dry.     Capillary Refill: Capillary refill takes less than 2 seconds.  Neurological:     Mental Status: He is alert.  Psychiatric:        Mood and Affect: Mood normal.     (all labs ordered are listed, but only  abnormal results are displayed) Labs Reviewed  COMPREHENSIVE METABOLIC PANEL WITH GFR - Abnormal; Notable for the following components:      Result Value   Glucose, Bld 110 (*)    BUN 22 (*)    Calcium 10.7 (*)    All other components within normal limits  CBC - Abnormal; Notable for the following components:   WBC 12.3 (*)    All other components within normal limits  LIPASE, BLOOD  URINALYSIS, ROUTINE W REFLEX MICROSCOPIC    EKG: None  Radiology: CT ABDOMEN PELVIS W CONTRAST Result Date: 05/04/2024 CLINICAL DATA:  Left lower quadrant pain several days. EXAM: CT ABDOMEN AND PELVIS WITH CONTRAST TECHNIQUE: Multidetector CT imaging of the abdomen and pelvis was performed using the standard protocol following  bolus administration of intravenous contrast. RADIATION DOSE REDUCTION: This exam was performed according to the departmental dose-optimization program which includes automated exposure control, adjustment of the mA and/or kV according to patient size and/or use of iterative reconstruction technique. CONTRAST:  100mL OMNIPAQUE IOHEXOL 300 MG/ML  SOLN COMPARISON:  None Available. FINDINGS: Lower chest: Heart is normal size.  Visualized lung bases are clear. Hepatobiliary: Liver, gallbladder and biliary tree are normal. Pancreas: Normal. Spleen: Normal. Adrenals/Urinary Tract: Adrenal glands are normal. Kidneys are normal in size without hydronephrosis or nephrolithiasis. There is patchy cortical low-attenuation over the left kidney somewhat masslike in areas. These findings likely due to infection/pyelonephritis. Underlying mass is possible, but less likely. No perinephric inflammation or fluid. Ureters and bladder are normal. Stomach/Bowel: Stomach and small bowel are normal. Appendix is normal. There is diverticulosis throughout the colon without active inflammation. Vascular/Lymphatic: Normal caliber of the abdominal aorta with minimal calcified plaque over the distal abdominal aorta. Remaining vascular structures are unremarkable. No adenopathy. Reproductive: Prostate is unremarkable. Other: No free peritoneal fluid or focal inflammatory change. Musculoskeletal: No focal abnormality. IMPRESSION: 1. Patchy cortical low-attenuation over the left kidney likely due to infection/pyelonephritis. Underlying mass is possible, but less likely. Recommend follow-up renal ultrasound 6-8 weeks after appropriate treatment. 2. Colonic diverticulosis without active inflammation. 3. Aortic atherosclerosis. Aortic Atherosclerosis (ICD10-I70.0). Electronically Signed   By: Toribio Agreste M.D.   On: 05/04/2024 09:22     Procedures   Medications Ordered in the ED  0.9 %  sodium chloride  infusion (0 mLs Intravenous Stopped  05/04/24 1016)  HYDROmorphone  (DILAUDID ) injection 0.5 mg (0.5 mg Intravenous Given 05/04/24 0834)  ondansetron  (ZOFRAN ) injection 4 mg (4 mg Intravenous Given 05/04/24 0835)  iohexol (OMNIPAQUE) 300 MG/ML solution 100 mL (100 mLs Intravenous Contrast Given 05/04/24 0858)  cefTRIAXone (ROCEPHIN) 1 g in sodium chloride  0.9 % 100 mL IVPB (0 g Intravenous Stopped 05/04/24 1014)                                    Medical Decision Making Patient here for left lower quadrant abdominal pain.  Found to have inflammation of his kidney on CT scan consistent with possible pyelonephritis.  He does have a leukocytosis but all enough urine is negative.  No diverticulitis at this time.  Will treat him for possible pyelonephritis with Rocephin and prescription for Keflex .  Advise close follow-up with primary care and I put a referral in for urology as well.  Was given pain medication in the ER and feeling much better.  Advise return for any new or worsening symptoms otherwise Tylenol  Motrin as needed.  He feels comfortable to plan  to be discharged home.  Problems Addressed: Pyelonephritis: acute illness or injury  Amount and/or Complexity of Data Reviewed External Data Reviewed: notes.    Details: Outpatient records reviewed and patient was seen in the office on Monday and CT scan was ordered for later today by primary care provider Labs: ordered. Decision-making details documented in ED Course.    Details: Ordered and reviewed by me and patient with slight leukocytosis Radiology: ordered and independent interpretation performed. Decision-making details documented in ED Course.    Details: Ordered and reviewed by me CT abdomen pelvis: Shows evidence of inflammation around the kidney but no diverticulitis or any other acute abnormality  Risk OTC drugs. Prescription drug management. Parenteral controlled substances. Drug therapy requiring intensive monitoring for toxicity.     Final diagnoses:   Pyelonephritis    ED Discharge Orders          Ordered    cephALEXin  (KEFLEX ) 500 MG capsule  4 times daily        05/04/24 1001    Ambulatory referral to Urology       Comments: Patient had inflammation around the kidney and pain on CT scan in the ED   05/04/24 1003               Jamani Bearce L, DO 05/04/24 1036

## 2024-05-04 NOTE — ED Notes (Signed)
 Patient transported to CT

## 2024-05-04 NOTE — Discharge Instructions (Addendum)
 Take your antibiotics as prescribed.  Call and follow-up with primary care in 1 to 2 weeks.  Follow-up with urology.  Referral was placed and they should call you to schedule appointment.

## 2024-05-04 NOTE — ED Notes (Signed)
Discharge instructions, follow up care, and prescription reviewed and explained, pt verbalized understanding.  

## 2024-05-05 ENCOUNTER — Encounter: Payer: Self-pay | Admitting: Internal Medicine

## 2024-05-05 ENCOUNTER — Telehealth: Payer: Self-pay | Admitting: Internal Medicine

## 2024-05-05 ENCOUNTER — Encounter: Payer: Self-pay | Admitting: Physician Assistant

## 2024-05-05 DIAGNOSIS — R110 Nausea: Secondary | ICD-10-CM

## 2024-05-05 NOTE — Telephone Encounter (Signed)
 Unable to LVM to confirm schedule change. Sent MyChart message.

## 2024-05-06 MED ORDER — ONDANSETRON 4 MG PO TBDP: 4.0000 mg | ORAL_TABLET | Freq: Three times a day (TID) | ORAL | 2 refills | Status: DC | PRN

## 2024-05-11 ENCOUNTER — Ambulatory Visit: Admitting: Internal Medicine

## 2024-05-11 ENCOUNTER — Inpatient Hospital Stay: Admitting: Internal Medicine

## 2024-05-11 ENCOUNTER — Other Ambulatory Visit: Payer: Self-pay | Admitting: *Deleted

## 2024-05-11 ENCOUNTER — Telehealth: Payer: Self-pay | Admitting: *Deleted

## 2024-05-11 ENCOUNTER — Encounter: Payer: Self-pay | Admitting: Internal Medicine

## 2024-05-11 ENCOUNTER — Ambulatory Visit: Payer: Self-pay | Admitting: Internal Medicine

## 2024-05-11 VITALS — BP 136/86 | HR 65 | Temp 98.4°F | Ht 73.0 in | Wt 232.0 lb

## 2024-05-11 DIAGNOSIS — M5459 Other low back pain: Secondary | ICD-10-CM

## 2024-05-11 DIAGNOSIS — E782 Mixed hyperlipidemia: Secondary | ICD-10-CM | POA: Diagnosis not present

## 2024-05-11 DIAGNOSIS — R29818 Other symptoms and signs involving the nervous system: Secondary | ICD-10-CM

## 2024-05-11 DIAGNOSIS — I700 Atherosclerosis of aorta: Secondary | ICD-10-CM | POA: Diagnosis not present

## 2024-05-11 DIAGNOSIS — N2889 Other specified disorders of kidney and ureter: Secondary | ICD-10-CM | POA: Diagnosis not present

## 2024-05-11 DIAGNOSIS — F4322 Adjustment disorder with anxiety: Secondary | ICD-10-CM

## 2024-05-11 DIAGNOSIS — K625 Hemorrhage of anus and rectum: Secondary | ICD-10-CM

## 2024-05-11 DIAGNOSIS — M5116 Intervertebral disc disorders with radiculopathy, lumbar region: Secondary | ICD-10-CM | POA: Diagnosis not present

## 2024-05-11 DIAGNOSIS — R7303 Prediabetes: Secondary | ICD-10-CM | POA: Diagnosis not present

## 2024-05-11 DIAGNOSIS — N12 Tubulo-interstitial nephritis, not specified as acute or chronic: Secondary | ICD-10-CM | POA: Diagnosis not present

## 2024-05-11 DIAGNOSIS — E162 Hypoglycemia, unspecified: Secondary | ICD-10-CM | POA: Insufficient documentation

## 2024-05-11 HISTORY — DX: Tubulo-interstitial nephritis, not specified as acute or chronic: N12

## 2024-05-11 LAB — LIPID PANEL
Cholesterol: 172 mg/dL (ref 0–200)
HDL: 31.3 mg/dL — ABNORMAL LOW (ref >39.00)
LDL Cholesterol: 118 mg/dL — ABNORMAL HIGH (ref 0–99)
NonHDL: 140.23
Total CHOL/HDL Ratio: 5
Triglycerides: 112 mg/dL (ref 0.0–149.0)
VLDL: 22.4 mg/dL (ref 0.0–40.0)

## 2024-05-11 LAB — COMPREHENSIVE METABOLIC PANEL WITH GFR
ALT: 30 U/L (ref 0–53)
AST: 24 U/L (ref 0–37)
Albumin: 4.4 g/dL (ref 3.5–5.2)
Alkaline Phosphatase: 74 U/L (ref 39–117)
BUN: 21 mg/dL (ref 6–23)
CO2: 23 meq/L (ref 19–32)
Calcium: 10.3 mg/dL (ref 8.4–10.5)
Chloride: 105 meq/L (ref 96–112)
Creatinine, Ser: 1.18 mg/dL (ref 0.40–1.50)
GFR: 71.41 mL/min (ref >60.00)
Glucose, Bld: 48 mg/dL — CL (ref 70–99)
Potassium: 4.6 meq/L (ref 3.5–5.1)
Sodium: 143 meq/L (ref 135–145)
Total Bilirubin: 0.3 mg/dL (ref 0.2–1.2)
Total Protein: 7.5 g/dL (ref 6.0–8.3)

## 2024-05-11 LAB — HEMOGLOBIN A1C: Hgb A1c MFr Bld: 5.6 % (ref 4.6–6.5)

## 2024-05-11 MED ORDER — HYDROCODONE-ACETAMINOPHEN 10-325 MG PO TABS: 1.0000 | ORAL_TABLET | ORAL | 0 refills | Status: DC | PRN

## 2024-05-11 MED ORDER — KETOROLAC TROMETHAMINE 10 MG PO TABS: 10.0000 mg | ORAL_TABLET | Freq: Four times a day (QID) | ORAL | 0 refills | Status: DC | PRN

## 2024-05-11 MED ORDER — ARIPIPRAZOLE 5 MG PO TABS: 5.0000 mg | ORAL_TABLET | Freq: Every day | ORAL | 4 refills | Status: AC

## 2024-05-11 MED ORDER — FLUOXETINE HCL 20 MG PO CAPS: 20.0000 mg | ORAL_CAPSULE | Freq: Every day | ORAL | 3 refills | Status: AC

## 2024-05-11 MED ORDER — ALPRAZOLAM 0.5 MG PO TABS: 0.5000 mg | ORAL_TABLET | Freq: Two times a day (BID) | ORAL | 0 refills | Status: DC | PRN

## 2024-05-11 NOTE — Telephone Encounter (Signed)
 Noted. Please call patient and discuss warning signs of hypoglycemia. If he is asymptomatic then no further action is needed.

## 2024-05-11 NOTE — Patient Instructions (Addendum)
 It was a pleasure seeing you today! Your health and satisfaction are our top priorities.  Joseph Cone, MD  VISIT SUMMARY: Today, you were seen for persistent back pain, a recent kidney infection, and other health concerns. We discussed your chronic low back pain, which has been severe and radiates down your leg, and your recent kidney infection, which has improved with antibiotics. We also addressed your mental health challenges, high blood pressure, and concerns about sleep apnea. Additionally, we reviewed your recent experience with bright red blood per rectum and prediabetes.  YOUR PLAN: -CHRONIC LOW BACK PAIN WITH LUMBAR RADICULOPATHY: Chronic low back pain with lumbar radiculopathy means you have ongoing lower back pain that radiates down your leg due to nerve irritation. We will prescribe hydrocodone  10 mg every 4 hours for 5 days for acute pain and Toradol  for additional relief. Continue taking gabapentin  300 mg three times daily. An MRI of your lumbar spine will be ordered to assess the current status of your discs and alignment.  -ACUTE PYELONEPHRITIS, LEFT KIDNEY, RESOLVING AND SUSPECTED RENAL MASS, LEFT KIDNEY (UNDER EVALUATION): Acute pyelonephritis is a kidney infection that is improving. There is also a suspected mass in your left kidney that needs further evaluation. We will order an MRI of your abdomen to check for a mass versus infection and do blood work to assess kidney function and ensure the infection is resolving.  -ADJUSTMENT DISORDER WITH ANXIETY: Adjustment disorder with anxiety means you are experiencing significant stress and anxiety due to personal and financial issues. We will prescribe Prozac for daily management of anxiety, Xanax for acute anxiety episodes, and Abilify for managing rage and irritability. Please use Xanax with caution due to its potential for addiction.  -BRIGHT RED BLOOD PER RECTUM (BRBPR): Bright red blood per rectum is likely due to hemorrhoids, which  may be worsened by straining. We will place an urgent referral for a colonoscopy to evaluate this symptom and rule out other causes.  -PREDIABETES: Prediabetes means your blood sugar levels are higher than normal but not high enough to be classified as diabetes. We will order an A1c test to assess your current diabetes status.  -SUSPECTED SLEEP APNEA (UNTREATED): Suspected sleep apnea means you may have a condition where your breathing stops and starts during sleep. We encourage you to follow up with a sleep specialist to complete the sleep study and obtain a CPAP machine.  INSTRUCTIONS: Please follow up with the recommended MRI for your lumbar spine and abdomen, and complete the blood work to assess your kidney function. Schedule an urgent colonoscopy to evaluate the bright red blood per rectum. Continue taking your prescribed medications as directed. Follow up with a sleep specialist to complete your sleep study and obtain a CPAP machine. Monitor your blood glucose levels and complete the A1c test to assess your diabetes status.  Your Providers PCP: [name], MD,  [phone]) Referring Provider: [name], MD,  [phone])  NEXT STEPS: [x]  Early Intervention: Schedule sooner appointment, call our on-call services, or go to emergency room if there is any significant Increase in pain or discomfort New or worsening symptoms Sudden or severe changes in your health [x]  Flexible Follow-Up: We recommend a Return in about 2 weeks (around 05/25/2024). for optimal routine care. This allows for progress monitoring and treatment adjustments. [x]  Preventive Care: Schedule your annual preventive care visit! It's typically covered by insurance and helps identify potential health issues early. [x]  Lab & X-ray Appointments: Incomplete tests scheduled today, or call  to schedule. X-rays: [facility] (M-F, 8:30am-noon or 1pm-5pm). [x]  Medical Information Release: Sign a  release form at front desk to obtain relevant medical information we don't have.  MAKING THE MOST OF OUR FOCUSED 20 MINUTE APPOINTMENTS: [x]   Clearly state your top concerns at the beginning of the visit to focus our discussion [x]   If you anticipate you will need more time, please inform the front desk during scheduling - we can book multiple appointments in the same week. [x]   If you have transportation problems- use our convenient video appointments or ask about transportation support. [x]   We can get down to business faster if you use MyChart to update information before the visit and submit non-urgent questions before your visit. Thank you for taking the time to provide details through MyChart.  Let our nurse know and she can import this information into your encounter documents.  Arrival and Wait Times: [x]   Arriving on time ensures that everyone receives prompt attention. [x]   Early morning (8a) and afternoon (1p) appointments tend to have shortest wait times. [x]   Unfortunately, we cannot delay appointments for late arrivals or hold slots during phone calls.  Getting Answers and Following Up [x]   Simple Questions & Concerns: For quick questions or basic follow-up after your visit, reach us  at [phone] or MyChart messaging. [x]   Complex Concerns: If your concern is more complex, scheduling an appointment might be best. Discuss this with the staff to find the most suitable option. [x]   Lab & Imaging Results: We'll contact you directly if results are abnormal or you don't use MyChart. Most normal results will be on MyChart within 2-3 business days, with a review message from Dr. Jesus. Haven't heard back in 2 weeks? Need results sooner? Contact us  at [phone]. [x]   Referrals: Our referral coordinator will manage specialist referrals. The specialist's office should contact you within 2 weeks to schedule an appointment. Call us  if you haven't heard from them after 2  weeks.  Staying Connected [x]   MyChart: Activate your MyChart for the fastest way to access results and message us . See the last page of this paperwork for instructions on how to activate.  Bring to Your Next Appointment [x]   Medications: Please bring all your medication bottles to your next appointment to ensure we have an accurate record of your prescriptions. [x]   Health Diaries: If you're monitoring any health conditions at home, keeping a diary of your readings can be very helpful for discussions at your next appointment.  Billing [x]   X-ray & Lab Orders: These are billed by separate companies. Contact the invoicing company directly for questions or concerns. [x]   Visit Charges: Discuss any billing inquiries with our administrative services team.  Your Satisfaction Matters [x]   Share Your Experience: We strive for your satisfaction! If you have any complaints, or preferably compliments, please let Dr. Jesus know directly or contact our Practice Administrators, Manuelita Rubin or [employer], by asking at the front desk.   Reviewing Your Records [x]   Review this early draft of your clinical encounter notes below and the final encounter summary tomorrow on MyChart after its been completed.  All orders placed so far are visible here: Renal mass -     MR ABDOMEN W WO CONTRAST; Future -     AMB Referral [facility] Care Management  Pyelonephritis Assessment & Plan: Acute pyelonephritis, left kidney, resolving and suspected renal mass, left kidney (under evaluation)   Acute pyelonephritis of the left kidney  is resolving with significant improvement in symptoms. A suspected renal mass on imaging presents a differential of infection versus mass. A previous CT scan showed a low attenuation area, likely due to infection, but a mass cannot be ruled out. An MRI is preferred for further evaluation due to superior imaging capabilities. Order an MRI of the abdomen to evaluate the left kidney for mass  versus infection and blood work to assess kidney function and ensure resolution of the UTI.  Orders: -     CBC With Diff/Platelet; Future -     Comprehensive metabolic panel with GFR -     Urinalysis w microscopic + reflex cultur  Sciatica of right side due to displacement of lumbar intervertebral disc -     MR LUMBAR SPINE W WO CONTRAST; Future -     Ambulatory referral to Physical Therapy -     HYDROcodone -Acetaminophen ; Take 1 tablet by mouth every 4 (four) hours as needed for up to 5 days.  Dispense: 30 tablet; Refill: 0 -     Ketorolac  Tromethamine ; Take 1 tablet (10 mg total) by mouth every 6 (six) hours as needed.  Dispense: 20 tablet; Refill: 0 -     AMB Referral [facility] Care Management  Adjustment disorder with anxiety -     FLUoxetine HCl; Take 1 capsule (20 mg total) by mouth daily.  Dispense: 90 capsule; Refill: 3 -     ALPRAZolam; Take 1 tablet (0.5 mg total) by mouth 2 (two) times daily as needed for anxiety (do not take within 4 hours of opioid).  Dispense: 20 tablet; Refill: 0 -     ARIPiprazole; Take 1 tablet (5 mg total) by mouth daily.  Dispense: 90 tablet; Refill: 4 -     AMB Referral [facility] Care Management  BRBPR (bright red blood per rectum) Assessment & Plan: Bright red blood per rectum (BRBPR)   BRBPR is likely secondary to hemorrhoids, possibly exacerbated by chronic low back pain and straining. A previous colonoscopy indicated the need for follow-up screening. Place an urgent referral for a colonoscopy to evaluate BRBPR and rule out other causes.  Orders: -     Ambulatory referral to Gastroenterology  Prediabetes Assessment & Plan: Recent evaluation of diabetes status emphasizes the importance of monitoring blood glucose levels. Order an A1c test to assess current diabetes status.  Orders: -     Hemoglobin A1c  Atherosclerosis of aorta -     Lipid panel  Mixed hyperlipidemia Assessment & Plan: Shared decision-making done; patient understood rationale and  agreed to labwork fasting lipid profile    Suspected sleep apnea Assessment & Plan: Suspected sleep apnea (untreated)   Suspected sleep apnea with a previous referral for a sleep study remains untreated as no CPAP machine has been obtained yet due to other health priorities. Encourage follow-up with a sleep specialist to complete the sleep study and obtain a CPAP machine.

## 2024-05-11 NOTE — Assessment & Plan Note (Signed)
 Shared decision-making done; patient understood rationale and agreed to labwork fasting lipid profile

## 2024-05-11 NOTE — Telephone Encounter (Signed)
 See previews note

## 2024-05-11 NOTE — Telephone Encounter (Addendum)
 Joseph Williamson from [facility] called with Critical Lab Report   Glucose 48   VO given to Dr Kennyth for advise  Send message to PCP and Dr Kennyth Sermon with patient, ask patient if he was fasting when blood was done. Stated he had a big breakfast with two cup of coffee  Patient asymptomatic at this time

## 2024-05-11 NOTE — Assessment & Plan Note (Signed)
 Recent evaluation of diabetes status emphasizes the importance of monitoring blood glucose levels. Order an A1c test to assess current diabetes status.

## 2024-05-11 NOTE — Assessment & Plan Note (Signed)
 Acute pyelonephritis, left kidney, resolving and suspected renal mass, left kidney (under evaluation)   Acute pyelonephritis of the left kidney is resolving with significant improvement in symptoms. A suspected renal mass on imaging presents a differential of infection versus mass. A previous CT scan showed a low attenuation area, likely due to infection, but a mass cannot be ruled out. An MRI is preferred for further evaluation due to superior imaging capabilities due to renal mass and smoking history ultrasound cannot bosniak classify.  Patient had no urinary tract symptoms prior to the sudden pain and is male so odds of denovo urinary tract infection (UTI) without mass are lower.  Order an MRI of the abdomen to evaluate the left kidney for mass features blood/urine work to assess kidney function and ensure resolution of the UTI.

## 2024-05-11 NOTE — Assessment & Plan Note (Signed)
 Bright red blood per rectum (BRBPR)   BRBPR is likely secondary to hemorrhoids, possibly exacerbated by chronic low back pain and straining. A previous colonoscopy indicated the need for follow-up screening. Place an urgent referral for a colonoscopy to evaluate BRBPR and rule out other causes.

## 2024-05-11 NOTE — Progress Notes (Addendum)
 ==============================  [facility]: [phone]   -- Medical Office Visit --  Patient: Joseph Williamson      Age: 53 y.o.       Sex:  male  Date:   05/11/2024 Today's Healthcare Provider: Bernardino KANDICE Cone, MD  ==============================   Chief Complaint: Hospitalization Follow-up (Feeling little better each day.)  Discussed the use of AI scribe software for clinical note transcription with the patient, who gave verbal consent to proceed.  History of Present Illness 53 year old male who presents with persistent back pain and a recent kidney infection.  He experiences significant back pain, described as severe and radiating from the lower back down to the foot, with a burning sensation and past numbness. The pain disrupts his sleep and worsens with movement. He has a long history of construction work, which may have contributed to his back issues. He is currently taking gabapentin , two pills three times a day, but finds it ineffective for pain management. He has undergone physical therapy and had injections earlier in the year.  Pain worsening anyway, just since last visit - but he has not heard about the lumbar MRI that was ordered.  He recently had a kidney infection, which has improved by about fifty percent after a few days of antibiotics. The infection was in the left kidney, causing significant pain in the front rather than the back, and was associated with fevers.  He faces mental health challenges, attributing them to stress from his business and personal life. He is managing a database administrator and has financial difficulties due to market changes and disaster loan issues. He reports high blood pressure, which he believes is stress-related. He has used Xanax following a traumatic event and is familiar with its effects.  He has not yet received a CPAP machine despite being referred for a sleep study- has not scheduled.SABRA He is  concerned about insurance coverage and is trying to complete necessary medical evaluations before potential changes at the end of the year. 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  Background Reviewed: Problem List: has Former smoker; Rhinitis medicamentosa; Dysfunction of both eustachian tubes; Bilateral hearing loss; Generalized osteoarthritis; Lumbar radiculopathy; Chronic swimmer's ear of right side; Non-seasonal allergic rhinitis; Overweight (BMI 25.0-29.9); Trigger finger; Prediabetes; Hyperlipidemia; History of perforation of tympanic membrane; FH: prostate cancer; Skin lesion; Dermatitis; Left tennis elbow; Suspected sleep apnea; Chronic bilateral low back pain with bilateral sciatica; BRBPR (bright red blood per rectum); Cellulitis of abdominal wall; Renal mass; and Pyelonephritis on their problem list. Past Medical History:  has a past medical history of Arthritis, Back pain, Cluster headaches, COVID-19 (07/11/2021), History of chickenpox, Hypertension, Perforation of right tympanic membrane (01/07/2021), Primary hypertension (09/11/2019), and Recurrent acute serous otitis media of right ear (07/01/2021). Past Surgical History:   has a past surgical history that includes none and No past surgeries. Social History:   reports that he has quit smoking. His smoking use included cigarettes. He started smoking about 10 months ago. He has a 0.1 pack-year smoking history. He has never used smokeless tobacco. He reports that he does not currently use alcohol after a past usage of about 14.0 standard drinks of alcohol per week. He reports that he does not use drugs. Family History:  family history  includes Arthritis in his mother; Cancer in his mother; Colon polyps in his father and mother; Gout  in his father; Kidney Stones in his father; Pancreatic cancer in his mother; Stroke in his father. Allergies:  has no known allergies.   Medication Reconciliation: Current Outpatient Medications on File Prior to Visit  Medication Sig   cephALEXin  (KEFLEX ) 500 MG capsule Take 1 capsule (500 mg total) by mouth 4 (four) times daily for 10 days.   gabapentin  (NEURONTIN ) 300 MG capsule Take 2 capsules (600 mg total) by mouth 3 (three) times daily.   nystatin -triamcinolone  ointment (MYCOLOG) Apply 1 Application topically 2 (two) times daily.   ondansetron  (ZOFRAN -ODT) 4 MG disintegrating tablet Take 1 tablet (4 mg total) by mouth every 8 (eight) hours as needed for nausea or vomiting.   doxycycline  (VIBRA -TABS) 100 MG tablet Take 1 tablet (100 mg total) by mouth 2 (two) times daily. (Patient not taking: Reported on 05/11/2024)   No current facility-administered medications on file prior to visit.   Medications Discontinued During This Encounter  Medication Reason   HYDROcodone -acetaminophen  (NORCO/VICODIN) 5-325 MG per tablet Expired Prescription     Physical Exam:    05/11/2024   10:30 AM 05/04/2024   10:15 AM 05/04/2024    8:12 AM  Vitals with BMI  Height 6' 1    Weight 232 lbs    BMI 30.62    Systolic 136 152 840  Diastolic 86 95 106  Pulse 65 57 56  Vital signs reviewed.  Nursing notes reviewed. Weight trend reviewed. Physical Activity: Sufficiently Active (10/29/2022)   Exercise Vital Sign    Days of Exercise per Week: 6 days    Minutes of Exercise per Session: 120 min   General Appearance:  No acute distress appreciable.   Well-groomed, healthy-appearing male.  Well proportioned with no abnormal fat distribution.  Good muscle tone. Pulmonary:  Normal work of breathing at rest, no respiratory distress apparent. SpO2: 98 %  Musculoskeletal: All extremities are intact.  Neurological:  Awake, alert, oriented, and engaged.  No obvious focal neurological deficits or cognitive  impairments.  Sensorium seems unclouded.   Speech is clear and coherent with logical content. Psychiatric:  Appropriate mood, pleasant and cooperative demeanor, thoughtful and engaged during the exam   Verbalized to patient: Physical Exam ABDOMEN: Right kidney normal. Left kidney shows possible infection or mass. MUSCULOSKELETAL: Spine shows disc degeneration and alignment intact.  Results RADIOLOGY CT scan: Low attenuation in the left kidney, likely due to infection, with a possible underlying mass. The right kidney appears normal but enlarged. The spine shows multiple degenerated discs with bone-on-bone contact.     11/19/2023    7:57 AM 08/20/2023   10:41 AM 11/18/2022    1:36 PM 10/29/2022    2:55 PM  PHQ 2/9 Scores  PHQ - 2 Score 0 0 0 0  PHQ- 9 Score 0   2       Data saved with a previous flowsheet row definition     No results found for any visits on 05/11/24.} Admission on 05/04/2024, Discharged on 05/04/2024  Component Date Value Ref Range Status   Lipase 05/04/2024 17  11 - 51 U/L Final   Sodium 05/04/2024 140  135 - 145 mmol/L Final   Potassium 05/04/2024 4.7  3.5 - 5.1 mmol/L Final   Chloride 05/04/2024 102  98 - 111 mmol/L Final   CO2 05/04/2024 30  22 - 32 mmol/L Final   Glucose, Bld 05/04/2024 110 (H)  70 -  99 mg/dL Final   BUN 88/93/7974 22 (H)  6 - 20 mg/dL Final   Creatinine, Ser 05/04/2024 1.17  0.61 - 1.24 mg/dL Final   Calcium 88/93/7974 10.7 (H)  8.9 - 10.3 mg/dL Final   Total Protein 88/93/7974 7.5  6.5 - 8.1 g/dL Final   Albumin 88/93/7974 4.7  3.5 - 5.0 g/dL Final   AST 88/93/7974 25  15 - 41 U/L Final   ALT 05/04/2024 25  0 - 44 U/L Final   Alkaline Phosphatase 05/04/2024 86  38 - 126 U/L Final   Total Bilirubin 05/04/2024 0.4  0.0 - 1.2 mg/dL Final   GFR, Estimated 05/04/2024 >60  >60 mL/min Final   Anion gap 05/04/2024 8  5 - 15 Final   WBC 05/04/2024 12.3 (H)  4.0 - 10.5 K/uL Final   RBC 05/04/2024 4.66  4.22 - 5.81 MIL/uL Final   Hemoglobin  05/04/2024 14.2  13.0 - 17.0 g/dL Final   HCT 88/93/7974 40.9  39.0 - 52.0 % Final   MCV 05/04/2024 87.8  80.0 - 100.0 fL Final   MCH 05/04/2024 30.5  26.0 - 34.0 pg Final   MCHC 05/04/2024 34.7  30.0 - 36.0 g/dL Final   RDW 88/93/7974 12.2  11.5 - 15.5 % Final   Platelets 05/04/2024 191  150 - 400 K/uL Final   nRBC 05/04/2024 0.0  0.0 - 0.2 % Final   Color, Urine 05/04/2024 YELLOW  YELLOW Final   APPearance 05/04/2024 CLEAR  CLEAR Final   Specific Gravity, Urine 05/04/2024 1.025  1.005 - 1.030 Final   pH 05/04/2024 6.0  5.0 - 8.0 Final   Glucose, UA 05/04/2024 NEGATIVE  NEGATIVE mg/dL Final   Hgb urine dipstick 05/04/2024 NEGATIVE  NEGATIVE Final   Bilirubin Urine 05/04/2024 NEGATIVE  NEGATIVE Final   Ketones, ur 05/04/2024 NEGATIVE  NEGATIVE mg/dL Final   Protein, ur 88/93/7974 NEGATIVE  NEGATIVE mg/dL Final   Nitrite 88/93/7974 NEGATIVE  NEGATIVE Final   Leukocytes,Ua 05/04/2024 NEGATIVE  NEGATIVE Final  Appointment on 05/03/2024  Component Date Value Ref Range Status   Sodium 05/03/2024 141  135 - 145 mEq/L Final   Potassium 05/03/2024 4.4  3.5 - 5.1 mEq/L Final   Chloride 05/03/2024 103  96 - 112 mEq/L Final   CO2 05/03/2024 30  19 - 32 mEq/L Final   Glucose, Bld 05/03/2024 87  70 - 99 mg/dL Final   BUN 88/94/7974 24 (H)  6 - 23 mg/dL Final   Creatinine, Ser 05/03/2024 1.25  0.40 - 1.50 mg/dL Final   GFR 88/94/7974 66.65  >60.00 mL/min Final   Calcium 05/03/2024 9.6  8.4 - 10.5 mg/dL Final   WBC 88/94/7974 8.1  4.0 - 10.5 K/uL Final   RBC 05/03/2024 4.66  4.22 - 5.81 Mil/uL Final   Hemoglobin 05/03/2024 14.2  13.0 - 17.0 g/dL Final   HCT 88/94/7974 41.4  39.0 - 52.0 % Final   MCV 05/03/2024 89.0  78.0 - 100.0 fl Final   MCHC 05/03/2024 34.2  30.0 - 36.0 g/dL Final   RDW 88/94/7974 13.0  11.5 - 15.5 % Final   Platelets 05/03/2024 187.0  150.0 - 400.0 K/uL Final   Neutrophils Relative % 05/03/2024 55.9  43.0 - 77.0 % Final   Lymphocytes Relative 05/03/2024 33.8  12.0 - 46.0  % Final   Monocytes Relative 05/03/2024 7.3  3.0 - 12.0 % Final   Eosinophils Relative 05/03/2024 2.3  0.0 - 5.0 % Final   Basophils Relative 05/03/2024 0.7  0.0 - 3.0 % Final   Neutro Abs 05/03/2024 4.6  1.4 - 7.7 K/uL Final   Lymphs Abs 05/03/2024 2.8  0.7 - 4.0 K/uL Final   Monocytes Absolute 05/03/2024 0.6  0.1 - 1.0 K/uL Final   Eosinophils Absolute 05/03/2024 0.2  0.0 - 0.7 K/uL Final   Basophils Absolute 05/03/2024 0.1  0.0 - 0.1 K/uL Final  Office Visit on 05/01/2024  Component Date Value Ref Range Status   Color, Urine 05/01/2024 YELLOW  Yellow;Lt. Yellow;Straw;Dark Yellow;Amber;Green;Red;Brown Final   APPearance 05/01/2024 CLEAR  Clear;Turbid;Slightly Cloudy;Cloudy Final   Specific Gravity, Urine 05/01/2024 1.020  1.000 - 1.030 Final   pH 05/01/2024 6.5  5.0 - 8.0 Final   Total Protein, Urine 05/01/2024 NEGATIVE  Negative Final   Urine Glucose 05/01/2024 NEGATIVE  Negative Final   Ketones, ur 05/01/2024 NEGATIVE  Negative Final   Bilirubin Urine 05/01/2024 NEGATIVE  Negative Final   Hgb urine dipstick 05/01/2024 NEGATIVE  Negative Final   Urobilinogen, UA 05/01/2024 0.2  0.0 - 1.0 Final   Leukocytes,Ua 05/01/2024 NEGATIVE  Negative Final   Nitrite 05/01/2024 NEGATIVE  Negative Final   WBC, UA 05/01/2024 0-2/hpf  0-2/hpf Final   RBC / HPF 05/01/2024 none seen  0-2/hpf Final   Squamous Epithelial / HPF 05/01/2024 Rare(0-4/hpf)  Rare(0-4/hpf) Final   MICRO NUMBER: 05/01/2024 [id number]   Final   SPECIMEN QUALITY: 05/01/2024 Adequate   Final   Sample Source 05/01/2024 URINE   Final   STATUS: 05/01/2024 FINAL   Final   Result: 05/01/2024 No Growth   Final   WBC 05/01/2024 8.1  4.0 - 10.5 K/uL Final   RBC 05/01/2024 4.44  4.22 - 5.81 Mil/uL Final   Hemoglobin 05/01/2024 13.6  13.0 - 17.0 g/dL Final   HCT 88/96/7974 39.6  39.0 - 52.0 % Final   MCV 05/01/2024 89.0  78.0 - 100.0 fl Final   MCHC 05/01/2024 34.4  30.0 - 36.0 g/dL Final   RDW 88/96/7974 12.9  11.5 - 15.5 % Final    Platelets 05/01/2024 187.0  150.0 - 400.0 K/uL Final   Neutrophils Relative % 05/01/2024 54.7  43.0 - 77.0 % Final   Lymphocytes Relative 05/01/2024 34.9  12.0 - 46.0 % Final   Monocytes Relative 05/01/2024 7.0  3.0 - 12.0 % Final   Eosinophils Relative 05/01/2024 2.6  0.0 - 5.0 % Final   Basophils Relative 05/01/2024 0.8  0.0 - 3.0 % Final   Neutro Abs 05/01/2024 4.4  1.4 - 7.7 K/uL Final   Lymphs Abs 05/01/2024 2.8  0.7 - 4.0 K/uL Final   Monocytes Absolute 05/01/2024 0.6  0.1 - 1.0 K/uL Final   Eosinophils Absolute 05/01/2024 0.2  0.0 - 0.7 K/uL Final   Basophils Absolute 05/01/2024 0.1  0.0 - 0.1 K/uL Final   Sodium 05/01/2024 143  135 - 145 mEq/L Final   Potassium 05/01/2024 5.3 No hemolysis seen (H)  3.5 - 5.1 mEq/L Final   Chloride 05/01/2024 104  96 - 112 mEq/L Final   CO2 05/01/2024 32  19 - 32 mEq/L Final   Glucose, Bld 05/01/2024 80  70 - 99 mg/dL Final   BUN 88/96/7974 19  6 - 23 mg/dL Final   Creatinine, Ser 05/01/2024 1.26  0.40 - 1.50 mg/dL Final   Total Bilirubin 05/01/2024 0.3  0.2 - 1.2 mg/dL Final   Alkaline Phosphatase 05/01/2024 77  39 - 117 U/L Final   AST 05/01/2024 28  0 - 37 U/L Final   ALT 05/01/2024  25  0 - 53 U/L Final   Total Protein 05/01/2024 7.0  6.0 - 8.3 g/dL Final   Albumin 88/96/7974 4.6  3.5 - 5.2 g/dL Final   GFR 88/96/7974 66.02  >60.00 mL/min Final   Calcium 05/01/2024 9.8  8.4 - 10.5 mg/dL Final   Lipase 88/96/7974 20.0  11.0 - 59.0 U/L Final  Office Visit on 11/19/2023  Component Date Value Ref Range Status   Cholesterol 11/19/2023 186  0 - 200 mg/dL Final   Triglycerides 94/76/7974 66.0  0.0 - 149.0 mg/dL Final   HDL 94/76/7974 51.40  >39.00 mg/dL Final   VLDL 94/76/7974 13.2  0.0 - 40.0 mg/dL Final   LDL Cholesterol 11/19/2023 122 (H)  0 - 99 mg/dL Final   Total CHOL/HDL Ratio 11/19/2023 4   Final   NonHDL 11/19/2023 134.87   Final   Sodium 11/19/2023 141  135 - 145 mEq/L Final   Potassium 11/19/2023 4.3  3.5 - 5.1 mEq/L Final    Chloride 11/19/2023 103  96 - 112 mEq/L Final   CO2 11/19/2023 30  19 - 32 mEq/L Final   Glucose, Bld 11/19/2023 89  70 - 99 mg/dL Final   BUN 94/76/7974 20  6 - 23 mg/dL Final   Creatinine, Ser 11/19/2023 0.93  0.40 - 1.50 mg/dL Final   Total Bilirubin 11/19/2023 0.5  0.2 - 1.2 mg/dL Final   Alkaline Phosphatase 11/19/2023 68  39 - 117 U/L Final   AST 11/19/2023 19  0 - 37 U/L Final   ALT 11/19/2023 17  0 - 53 U/L Final   Total Protein 11/19/2023 7.1  6.0 - 8.3 g/dL Final   Albumin 94/76/7974 4.7  3.5 - 5.2 g/dL Final   GFR 94/76/7974 95.35  >60.00 mL/min Final   Calcium 11/19/2023 9.8  8.4 - 10.5 mg/dL Final   TSH 94/76/7974 1.600  0.450 - 4.500 uIU/mL Final   Hgb A1c MFr Bld 11/19/2023 5.6  4.6 - 6.5 % Final   PSA 11/19/2023 0.38  0.10 - 4.00 ng/mL Final  Office Visit on 08/20/2023  Component Date Value Ref Range Status   Influenza A, POC 08/20/2023 Negative  Negative Final   Influenza B, POC 08/20/2023 Negative  Negative Final   SARS Coronavirus 2 Ag 08/20/2023 Negative  Negative Final  Lab on 11/25/2022  Component Date Value Ref Range Status   WBC 11/25/2022 9.3  4.0 - 10.5 K/uL Final   RBC 11/25/2022 4.39  4.22 - 5.81 Mil/uL Final   Hemoglobin 11/25/2022 13.4  13.0 - 17.0 g/dL Final   HCT 94/70/7975 39.5  39.0 - 52.0 % Final   MCV 11/25/2022 90.1  78.0 - 100.0 fl Final   MCHC 11/25/2022 33.9  30.0 - 36.0 g/dL Final   RDW 94/70/7975 13.1  11.5 - 15.5 % Final   Platelets 11/25/2022 230.0  150.0 - 400.0 K/uL Final   Neutrophils Relative % 11/25/2022 54.9  43.0 - 77.0 % Final   Lymphocytes Relative 11/25/2022 34.2  12.0 - 46.0 % Final   Monocytes Relative 11/25/2022 6.4  3.0 - 12.0 % Final   Eosinophils Relative 11/25/2022 3.9  0.0 - 5.0 % Final   Basophils Relative 11/25/2022 0.6  0.0 - 3.0 % Final   Neutro Abs 11/25/2022 5.1  1.4 - 7.7 K/uL Final   Lymphs Abs 11/25/2022 3.2  0.7 - 4.0 K/uL Final   Monocytes Absolute 11/25/2022 0.6  0.1 - 1.0 K/uL Final   Eosinophils  Absolute 11/25/2022 0.4  0.0 -  0.7 K/uL Final   Basophils Absolute 11/25/2022 0.1  0.0 - 0.1 K/uL Final   Hepatitis C Ab 11/25/2022 NON-REACTIVE  NON-REACTIVE Final   Sodium 11/25/2022 140  135 - 145 mEq/L Final   Potassium 11/25/2022 4.4  3.5 - 5.1 mEq/L Final   Chloride 11/25/2022 104  96 - 112 mEq/L Final   CO2 11/25/2022 30  19 - 32 mEq/L Final   Glucose, Bld 11/25/2022 96  70 - 99 mg/dL Final   BUN 94/70/7975 16  6 - 23 mg/dL Final   Creatinine, Ser 11/25/2022 0.90  0.40 - 1.50 mg/dL Final   Total Bilirubin 11/25/2022 0.3  0.2 - 1.2 mg/dL Final   Alkaline Phosphatase 11/25/2022 62  39 - 117 U/L Final   AST 11/25/2022 15  0 - 37 U/L Final   ALT 11/25/2022 14  0 - 53 U/L Final   Total Protein 11/25/2022 6.8  6.0 - 8.3 g/dL Final   Albumin 94/70/7975 4.1  3.5 - 5.2 g/dL Final   GFR 94/70/7975 99.86  >60.00 mL/min Final   Calcium 11/25/2022 9.4  8.4 - 10.5 mg/dL Final   Cholesterol 94/70/7975 157  0 - 200 mg/dL Final   Triglycerides 94/70/7975 48.0  0.0 - 149.0 mg/dL Final   HDL 94/70/7975 46.70  >39.00 mg/dL Final   VLDL 94/70/7975 9.6  0.0 - 59.9 mg/dL Final   LDL Cholesterol 11/25/2022 100 (H)  0 - 99 mg/dL Final   Total CHOL/HDL Ratio 11/25/2022 3   Final   NonHDL 11/25/2022 109.85   Final   TSH 11/25/2022 1.35  0.35 - 5.50 uIU/mL Final   Hgb A1c MFr Bld 11/25/2022 5.7  4.6 - 6.5 % Final  Office Visit on 11/11/2022  Component Date Value Ref Range Status   Rapid Strep A Screen 11/11/2022 Negative  Negative Final  Office Visit on 10/29/2022  Component Date Value Ref Range Status   Rapid Strep A Screen 10/29/2022 Negative  Negative Final  No image results found. CT ABDOMEN PELVIS W CONTRAST Result Date: 05/04/2024 CLINICAL DATA:  Left lower quadrant pain several days. EXAM: CT ABDOMEN AND PELVIS WITH CONTRAST TECHNIQUE: Multidetector CT imaging of the abdomen and pelvis was performed using the standard protocol following bolus administration of intravenous contrast. RADIATION  DOSE REDUCTION: This exam was performed according to the departmental dose-optimization program which includes automated exposure control, adjustment of the mA and/or kV according to patient size and/or use of iterative reconstruction technique. CONTRAST:  100mL OMNIPAQUE IOHEXOL 300 MG/ML  SOLN COMPARISON:  None Available. FINDINGS: Lower chest: Heart is normal size.  Visualized lung bases are clear. Hepatobiliary: Liver, gallbladder and biliary tree are normal. Pancreas: Normal. Spleen: Normal. Adrenals/Urinary Tract: Adrenal glands are normal. Kidneys are normal in size without hydronephrosis or nephrolithiasis. There is patchy cortical low-attenuation over the left kidney somewhat masslike in areas. These findings likely due to infection/pyelonephritis. Underlying mass is possible, but less likely. No perinephric inflammation or fluid. Ureters and bladder are normal. Stomach/Bowel: Stomach and small bowel are normal. Appendix is normal. There is diverticulosis throughout the colon without active inflammation. Vascular/Lymphatic: Normal caliber of the abdominal aorta with minimal calcified plaque over the distal abdominal aorta. Remaining vascular structures are unremarkable. No adenopathy. Reproductive: Prostate is unremarkable. Other: No free peritoneal fluid or focal inflammatory change. Musculoskeletal: No focal abnormality. IMPRESSION: 1. Patchy cortical low-attenuation over the left kidney likely due to infection/pyelonephritis. Underlying mass is possible, but less likely. Recommend follow-up renal ultrasound 6-8 weeks after appropriate treatment. 2.  Colonic diverticulosis without active inflammation. 3. Aortic atherosclerosis. Aortic Atherosclerosis (ICD10-I70.0). Electronically Signed   By: Toribio Agreste M.D.   On: 05/04/2024 09:22         ASSESSMENT & PLAN   Assessment & Plan Renal mass Pyelonephritis Acute pyelonephritis, left kidney, resolving and suspected renal mass, left kidney (under  evaluation)   Acute pyelonephritis of the left kidney is resolving with significant improvement in symptoms. A suspected renal mass on imaging presents a differential of infection versus mass. A previous CT scan showed a low attenuation area, likely due to infection, but a mass cannot be ruled out. An MRI is preferred for further evaluation due to superior imaging capabilities due to renal mass and smoking history ultrasound cannot bosniak classify.  Patient had no urinary tract symptoms prior to the sudden pain and is male so odds of denovo urinary tract infection (UTI) without mass are lower.  Order an MRI of the abdomen to evaluate the left kidney for mass features blood/urine work to assess kidney function and ensure resolution of the UTI. Sciatica of right side due to displacement of lumbar intervertebral disc Chronic low back pain with lumbar radiculopathy   Severe chronic low back pain with lumbar radiculopathy is exacerbated by degenerative lumbar disc disease, with significant disc degeneration and severe neuroforaminal narrowing. Pain radiates down the leg, likely due to nerve impingement. Previous injections provided relief, but current pain management is inadequate. Prescribe hydrocodone  10 mg every 4 hours for 5 days for acute pain management and Toradol  for additional relief. Continue gabapentin  300 mg orally three times daily. Order an MRI of the lumbar spine to assess the current status of discs and alignment. Adjustment disorder with anxiety Adjustment disorder with anxiety   Significant anxiety and stress are related to personal and financial issues, with symptoms including irritability and difficulty managing stress. His current mental health status is suboptimal, affecting overall well-being. Prescribe Prozac for daily management of anxiety and mood stabilization, Xanax for acute anxiety episodes with caution due to potential for addiction, and Abilify for management of rage and  irritability, with caution regarding long-term use due to potential weight gain. BRBPR (bright red blood per rectum) Bright red blood per rectum (BRBPR)   BRBPR is likely secondary to hemorrhoids, possibly exacerbated by chronic low back pain and straining. A previous colonoscopy indicated the need for follow-up screening. Place an urgent referral for a colonoscopy to evaluate BRBPR and rule out other causes. Prediabetes Recent evaluation of diabetes status emphasizes the importance of monitoring blood glucose levels. Order an A1c test to assess current diabetes status. Atherosclerosis of aorta Mixed hyperlipidemia Shared decision-making done; patient understood rationale and agreed to labwork fasting lipid profile  Suspected sleep apnea Suspected sleep apnea (untreated)   Suspected sleep apnea with a previous referral for a sleep study remains untreated as no CPAP machine has been obtained yet due to other health priorities. Encourage follow-up with a sleep specialist to complete the sleep study and obtain a CPAP machine. Intractable low back pain   ORDER ASSOCIATIONS  #   DIAGNOSIS / CONDITION ICD-10 ENCOUNTER ORDER     ICD-10-CM   1. Renal mass  N28.89 MR Abdomen W Wo Contrast    AMB Referral [facility]    2. Pyelonephritis  N12 CBC With Diff/Platelet    Comprehensive metabolic panel with GFR    Urinalysis w microscopic + reflex cultur    3. Sciatica of right side due to displacement of lumbar intervertebral disc  M51.16 MR LUMBAR SPINE W WO CONTRAST    Ambulatory referral to Physical Therapy    HYDROcodone -acetaminophen  (NORCO) 10-325 MG tablet    ketorolac  (TORADOL ) 10 MG tablet    AMB Referral [facility] Care Management    4. Adjustment disorder with anxiety  F43.22 FLUoxetine (PROZAC) 20 MG capsule    ALPRAZolam (XANAX) 0.5 MG tablet    ARIPiprazole (ABILIFY) 5 MG tablet    AMB Referral [facility]    5. BRBPR (bright red blood per rectum)  K62.5 Ambulatory  referral to Gastroenterology    6. Prediabetes  R73.03 HgB A1c    7. Atherosclerosis of aorta  I70.0 Lipid panel    8. Mixed hyperlipidemia  E78.2     9. Suspected sleep apnea  R29.818            Orders Placed in Encounter:   Lab Orders         CBC With Diff/Platelet         Comprehensive metabolic panel with GFR         Urinalysis w microscopic + reflex cultur         HgB A1c         Lipid panel     Imaging Orders         MR Abdomen W Wo Contrast         MR LUMBAR SPINE W WO CONTRAST     Referral Orders         Ambulatory referral to Physical Therapy         Ambulatory referral to Gastroenterology         AMB Referral [facility] Management     Meds ordered this encounter  Medications   FLUoxetine (PROZAC) 20 MG capsule    Sig: Take 1 capsule (20 mg total) by mouth daily.    Dispense:  90 capsule    Refill:  3   ALPRAZolam (XANAX) 0.5 MG tablet    Sig: Take 1 tablet (0.5 mg total) by mouth 2 (two) times daily as needed for anxiety (do not take within 4 hours of opioid).    Dispense:  20 tablet    Refill:  0   ARIPiprazole (ABILIFY) 5 MG tablet    Sig: Take 1 tablet (5 mg total) by mouth daily.    Dispense:  90 tablet    Refill:  4   HYDROcodone -acetaminophen  (NORCO) 10-325 MG tablet    Sig: Take 1 tablet by mouth every 4 (four) hours as needed for up to 5 days.    Dispense:  30 tablet    Refill:  0   ketorolac  (TORADOL ) 10 MG tablet    Sig: Take 1 tablet (10 mg total) by mouth every 6 (six) hours as needed.    Dispense:  20 tablet    Refill:  0    Orders Placed This Encounter  Procedures   MR Abdomen W Wo Contrast    Standing Status:   Future    Expiration Date:   05/11/2025    If indicated for the ordered procedure, I authorize the administration of contrast media per Radiology protocol:   Yes    What is the patient's sedation requirement?:   No Sedation    Does the patient have a pacemaker or implanted devices?:   No    Preferred imaging location?:    [facility] (table limit-550lbs)   MR LUMBAR SPINE W WO CONTRAST    Standing Status:  Future    Expiration Date:   05/11/2025    If indicated for the ordered procedure, I authorize the administration of contrast media per Radiology protocol:   Yes    What is the patient's sedation requirement?:   No Sedation    Does the patient have a pacemaker or implanted devices?:   No    Preferred imaging location?:   [facility] (table limit-550lbs)   CBC With Diff/Platelet    Standing Status:   Future    Expected Date:   05/11/2024    Expiration Date:   05/11/2025   Comprehensive metabolic panel with GFR   Urinalysis w microscopic + reflex cultur   HgB A1c   Lipid panel   Ambulatory referral to Physical Therapy    Referral Priority:   Routine    Referral Type:   Physical Medicine    Referral Reason:   Specialty Services Required    Requested Specialty:   Physical Therapy    Number of Visits Requested:   1   Ambulatory referral to Gastroenterology    Referral Priority:   Urgent    Referral Type:   Consultation    Referral Reason:   Specialty Services Required    Number of Visits Requested:   1   AMB Referral VBCI Care Management    Referral Priority:   Routine    Referral Type:   Consultation    Referral Reason:   Care Coordination    Number of Visits Requested:   1   Addendum 05/18/24; MRI lumbar denied was reordered with increased documentation after logistics problems making insurance call line difficult to impossible to navigate.  Medical Necessity: Patient is a 53 year old male with severe, persistent, and worsening low back pain radiating into the lower extremity, with burning and numbness, consistent with lumbar radiculopathy and sciatica. Pain is refractory to maximal conservative management, including physical therapy, oral non-opioid and opioid analgesics, gabapentin  (max dose), and prior lumbar epidural injections. The pain is functionally disabling, disrupting  sleep and daily activities, with significant impairment in mobility and quality of life. Neurological symptoms are present: burning, numbness, and radicular pain extending to the foot, suggestive of nerve root compression. Physical exam and prior imaging (CT) demonstrate severe lumbar disc degeneration, with bone-on-bone contact and likely neuroforaminal narrowing. 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 Previous MRI was denied despite clear clinical indications and failure of conservative management. MRI is medically necessary to: Assess degree of neural compression and disc degeneration Guide further interventional pain management and surgical planning Prevent permanent neurological deficits (paralysis, incontinence) Rule out other causes of radiculopathy Delay of advanced imaging risks permanent nerve injury and disability. Relevant ICD-10: M51.16: Sciatica due to lumbar intervertebral disc displacement M54.16: Radiculopathy, lumbar region M54.5: Low back pain Conservative management failed. MRI is required for definitive diagnosis and treatment planning.  Sample EMR Order Comment: Severe, chronic lumbar radiculopathy and sciatica with functional impairment, refractory to PT, gabapentin , opioid/NSAID therapy, and prior injections. Progressive neurological symptoms (burning, numbness, radicular pain to foot). CT shows severe disc degeneration, bone-on-bone contact, likely neuroforaminal narrowing. Patient is at risk for permanent nerve injury and disability. MRI required for diagnosis, surgical planning, and to prevent irreversible harm. Previous MRI denied despite medical necessity. Conservative management failed. Please approve.     This document was synthesized by artificial intelligence (Abridge) using HIPAA-compliant recording of the clinical interaction;   We discussed the use of AI  scribe software  for clinical note transcription with the patient, who gave verbal consent to proceed. additional Info: This encounter employed state-of-the-art, real-time, collaborative documentation. The patient actively reviewed and assisted in updating their electronic medical record on a shared screen, ensuring transparency and facilitating joint problem-solving for the problem list, overview, and plan. This approach promotes accurate, informed care. The treatment plan was discussed and reviewed in detail, including medication safety, potential side effects, and all patient questions. We confirmed understanding and comfort with the plan. Follow-up instructions were established, including contacting the office for any concerns, returning if symptoms worsen, persist, or new symptoms develop, and precautions for potential emergency department visits.

## 2024-05-11 NOTE — Assessment & Plan Note (Signed)
 Suspected sleep apnea (untreated)   Suspected sleep apnea with a previous referral for a sleep study remains untreated as no CPAP machine has been obtained yet due to other health priorities. Encourage follow-up with a sleep specialist to complete the sleep study and obtain a CPAP machine.

## 2024-05-11 NOTE — Assessment & Plan Note (Addendum)
 Acute pyelonephritis, left kidney, resolving and suspected renal mass, left kidney (under evaluation)   Acute pyelonephritis of the left kidney is resolving with significant improvement in symptoms. A suspected renal mass on imaging presents a differential of infection versus mass. A previous CT scan showed a low attenuation area, likely due to infection, but a mass cannot be ruled out. An MRI is preferred for further evaluation due to superior imaging capabilities due to renal mass and smoking history ultrasound cannot bosniak classify.  Patient had no urinary tract symptoms prior to the sudden pain and is male so odds of denovo urinary tract infection (UTI) without mass are lower.  Order an MRI of the abdomen to evaluate the left kidney for mass features blood/urine work to assess kidney function and ensure resolution of the UTI.

## 2024-05-12 ENCOUNTER — Encounter: Payer: Self-pay | Admitting: Orthopedic Surgery

## 2024-05-12 ENCOUNTER — Inpatient Hospital Stay
Admission: RE | Admit: 2024-05-12 | Discharge: 2024-05-12 | Disposition: A | Discharge status: Home / Self Care | Payer: Self-pay | Source: Ambulatory Visit | Attending: Orthopedic Surgery | Admitting: Orthopedic Surgery

## 2024-05-12 ENCOUNTER — Other Ambulatory Visit: Payer: Self-pay

## 2024-05-12 DIAGNOSIS — Z049 Encounter for examination and observation for unspecified reason: Secondary | ICD-10-CM

## 2024-05-12 LAB — URINALYSIS W MICROSCOPIC + REFLEX CULTURE
Bilirubin Urine: NEGATIVE
Glucose, UA: NEGATIVE
Hgb urine dipstick: NEGATIVE
Ketones, ur: NEGATIVE
Leukocyte Esterase: NEGATIVE
Nitrites, Initial: NEGATIVE
Protein, ur: NEGATIVE
Specific Gravity, Urine: 1.028 (ref 1.001–1.035)
pH: 6.5 (ref 5.0–8.0)

## 2024-05-12 LAB — CBC WITH DIFF/PLATELET
Basophils Absolute: 0.1 x10E3/uL (ref 0.0–0.2)
Basos: 1 %
EOS (ABSOLUTE): 0.3 x10E3/uL (ref 0.0–0.4)
Eos: 4 %
Hematocrit: 39.1 % (ref 37.5–51.0)
Hemoglobin: 12.9 g/dL — ABNORMAL LOW (ref 13.0–17.7)
Immature Grans (Abs): 0.1 x10E3/uL (ref 0.0–0.1)
Immature Granulocytes: 1 %
Lymphocytes Absolute: 2.8 x10E3/uL (ref 0.7–3.1)
Lymphs: 34 %
MCH: 30.4 pg (ref 26.6–33.0)
MCHC: 33 g/dL (ref 31.5–35.7)
MCV: 92 fL (ref 79–97)
Monocytes Absolute: 0.6 x10E3/uL (ref 0.1–0.9)
Monocytes: 7 %
Neutrophils Absolute: 4.4 x10E3/uL (ref 1.4–7.0)
Neutrophils: 53 %
Platelets: 283 x10E3/uL (ref 150–450)
RBC: 4.24 x10E6/uL (ref 4.14–5.80)
RDW: 12.2 % (ref 11.6–15.4)
WBC: 8.2 x10E3/uL (ref 3.4–10.8)

## 2024-05-12 LAB — NO CULTURE INDICATED

## 2024-05-12 NOTE — Progress Notes (Unsigned)
 Referring Physician:  Jesus Bernardino MATSU, MD [address] [place],  [place] [address]  Primary Physician:  Jesus Bernardino MATSU, MD  History of Present Illness: 05/17/2024 [name] has a history of hyperlipidemia, prediabetes, cluster headaches, HTN.   Recently had pyelonephritis. Getting MRI of abdomen to look at left kidney mass.   PCP has ordered updated lumbar MRI as well.   He has chronic LBP and had injection in February that helped for months. His current pain is the same pain he had at that time but is on the right side- he thinks it was on right side before as well.   Now with 3-4 weeks of constant LBP with right posterior leg pain to his foot. He has constant numbness and tingling in right leg. No left leg pain. He has weakness in right leg. Pain is worse with movement. No alleviating factors.   He did PT earlier this year with short term relief. Had improvement with ESI as above (by Dr. Franklin at [facility]).   He is taking neurontin  and norco 10.   Tobacco use: Does not smoke.   Bowel/Bladder Dysfunction: none  Conservative measures: TENS Unit, HEP Physical therapy: has participated with [facility] 04/15/23-08/03/23?? (Confirm dates-waiting on records) Multimodal medical therapy including regular antiinflammatories: Gabapentin , Hydrocodone  Injections:   08/16/2023-Left L5-S1 and left S1 TF ESI (helped 6-8 months)  Past Surgery: no spine surgery  Joseph Williamson has no symptoms of cervical myelopathy.  The symptoms are causing a significant impact on the patient's life.   Review of Systems:  A 10 point review of systems is negative, except for the pertinent positives and negatives detailed in the HPI.  Past Medical History: Past Medical History:  Diagnosis Date   Arthritis    Back pain    Cluster headaches    COVID-19 07/11/2021   History of chickenpox    Hypertension    Perforation of right tympanic membrane 01/07/2021   Primary hypertension  09/11/2019   Recurrent acute serous otitis media of right ear 07/01/2021    Past Surgical History: Past Surgical History:  Procedure Laterality Date   none     TRIGGER FINGER RELEASE Right 04/13/2023      Allergies: Allergies as of 05/17/2024   (No Known Allergies)    Medications: Outpatient Encounter Medications as of 05/17/2024  Medication Sig   ALPRAZolam (XANAX) 0.5 MG tablet Take 1 tablet (0.5 mg total) by mouth 2 (two) times daily as needed for anxiety (do not take within 4 hours of opioid).   ARIPiprazole (ABILIFY) 5 MG tablet Take 1 tablet (5 mg total) by mouth daily.   FLUoxetine (PROZAC) 20 MG capsule Take 1 capsule (20 mg total) by mouth daily.   gabapentin  (NEURONTIN ) 300 MG capsule Take 2 capsules (600 mg total) by mouth 3 (three) times daily.   HYDROcodone -acetaminophen  (NORCO) 10-325 MG tablet Take 1 tablet by mouth every 4 (four) hours as needed for up to 5 days.   [DISCONTINUED] ondansetron  (ZOFRAN -ODT) 4 MG disintegrating tablet Take 1 tablet (4 mg total) by mouth every 8 (eight) hours as needed for nausea or vomiting.   [DISCONTINUED] cephALEXin  (KEFLEX ) 500 MG capsule Take 1 capsule (500 mg total) by mouth 4 (four) times daily for 10 days.   [DISCONTINUED] doxycycline  (VIBRA -TABS) 100 MG tablet Take 1 tablet (100 mg total) by mouth 2 (two) times daily. (Patient not taking: Reported on 05/11/2024)   [DISCONTINUED] ketorolac  (TORADOL ) 10 MG tablet Take 1 tablet (10 mg  total) by mouth every 6 (six) hours as needed.   [DISCONTINUED] nystatin -triamcinolone  ointment (MYCOLOG) Apply 1 Application topically 2 (two) times daily.   No facility-administered encounter medications on file as of 05/17/2024.    Social History: Social History   Tobacco Use   Smoking status: Former    Types: Cigarettes   Smokeless tobacco: Never   Tobacco comments:    Quit January 2025  Vaping Use   Vaping status: Never Used  Substance Use Topics   Alcohol use: Not Currently     Alcohol/week: 14.0 standard drinks of alcohol    Types: 14 Cans of beer per week    Comment: quit some time ago   Drug use: No    Family Medical History: Family History  Problem Relation Age of Onset   Arthritis Mother    Cancer Mother        Pancreactic   Colon polyps Mother    Pancreatic cancer Mother    Gout Father    Kidney Stones Father    Colon polyps Father    Stroke Father        2/2 COVID   Colon cancer Neg Hx    Diabetes Neg Hx    Kidney disease Neg Hx    Liver disease Neg Hx     Physical Examination: Vitals:   05/17/24 1311  BP: (!) 172/110    General: Patient is well developed, well nourished, calm, collected, and in no apparent distress. Attention to examination is appropriate.  Respiratory: Patient is breathing without any difficulty.   NEUROLOGICAL:     Awake, alert, oriented to person, place, and time.  Speech is clear and fluent. Fund of knowledge is appropriate.   Cranial Nerves: Pupils equal round and reactive to light.  Facial tone is symmetric.    Mild right sided lower posterior lumbar tenderness.   No abnormal lesions on exposed skin.   Strength: Side Biceps Triceps Deltoid Interossei Grip Wrist Ext. Wrist Flex.  R 5 5 5 5 5 5 5   L 5 5 5 5 5 5 5    Side Iliopsoas Quads Hamstring PF DF EHL  R 5 5 5 5 5 5   L 5 5 5 5 5 5    He has pain with strength testing in right lower extremity, but no gross weakness.   Reflexes are 2+ and symmetric at the biceps, brachioradialis, patella and achilles.   Hoffman's is absent.   Clonus is not present.   Bilateral upper and lower extremity sensation is intact to light touch.     He has a limping gait favoring his right leg.   Medical Decision Making  Imaging: Lumbar MRI dated 06/13/23:  FINDINGS:    Alignment: Mild stepwise retrolisthesis at L2-L3 through L5-S1.  Vertebrae: Vertebral body heights are maintained. No marrow signal abnormalities to suggest neoplasm. Mild multilevel endplate edema  is likely degenerative.  Conus medullaris: In normal position. Normal signal and contour.  Degenerative changes:  T12-L1: No substantial canal or foraminal stenosis. L1-L2: No substantial canal or foraminal stenosis. L2-L3: Focus of T2/STIR hyperintensity along the posterior margin the disc is consistent with an annular fissure. Disc bulge with a superimposed left foraminal and extraforaminal disc herniation results in moderate narrowing of the left lateral recess with crowding of the descending left L3 nerve root. Mild left greater than right foraminal narrowing with disc contacting the exiting left L2 nerve root. L3-L4: Focus of T2/STIR hyperintensity along the posterior margin the disc is consistent with an  annular fissure. Disc bulge, facet hypertrophy and ligamentum flavum thickening result in mild to moderate narrowing of the lateral recesses and mild central stenosis with crowding of the descending L3 nerve roots. Superimposed right foraminal and extraforaminal disc herniation with mild to moderate left and moderate right foraminal stenosis. Disc contacts the exiting right L3 nerve root, similar to prior. L4-L5: Disc height loss and desiccation with disc bulge and superimposed inferiorly directed right eccentric disc herniation. In conjunction with facet hypertrophy, there is moderate right greater than left lateral recess stenosis with displacement of the descending L5 nerve roots, right greater than left. Mild central and moderate bilateral foraminal stenosis is similar to prior. L5-S1: Disc height loss and desiccation with disc bulge that deforms the ventral thecal sac. In conjunction with facet hypertrophy and ligamentum flavum thickening, there is similar mild narrowing of the lateral recesses. Advanced bilateral foraminal stenosis with deformity of the exiting L5 nerve roots is similar to prior.  Upper sacrum: No focal lesion identified.   IMPRESSION:  1.  Compared to MRI 06/22/2019,  there has been mild progression of degenerative disc changes at L2-L3 resulting in moderate narrowing of the left lateral recess and mild left greater than right foraminal stenosis. There is crowding of the descending left L3 nerve root and a left foraminal and extraforaminal disc contacts the exiting left L2 nerve root. 2.  Otherwise, lumbar spondylosis spanning L3-L4 through L5-S1 is similar to prior including moderate lateral recess stenosis at L3-L4 and L4-L5 with displacement of the right greater than left descending L5 nerve roots at L4-L5. Foraminal stenosis is again advanced bilaterally at L5-S1. Exam End: 06/13/23 11:55   Specimen Collected: 06/13/23 13:29 Last Resulted: 06/13/23 13:45  Received From: [facility]  Result Received: 08/20/23 07:58    I have personally reviewed the images and agree with the above interpretation.  Assessment and Plan: Mr. Tamblyn has chronic LBP and had injection in February that helped for months. His current pain is the same pain he had at that time- injection note states it was done on left side. He remembers his pain being on the right side.   Now with 3-4 weeks of constant LBP with right posterior leg pain to his foot. He has constant numbness and tingling in right leg. No left leg pain. He has weakness in right leg.   As above, this pain is similar to his previous pain earlier this year, but is on right side instead of left side.   He has known lumbar spondylosis and DDD. He has multilevel foraminal stenosis with mild central stenosis L3-L5. He has moderate/severe bilateral foraminal stenosis L5-S1.   Likely pain generator is L5-S1. He did great with previous left L5-S1 and left S1 TF ESI.   Treatment options discussed with patient and following plan made:   - Fast track injection order to [facility] for right L5-S1 TF and right S1 TF ESI.  - Hold on revisiting PT for now. May revisit in the future.  - Continue on neurontin  and prn norco from other  providers.  - If no improvement with above ESI, will plan to get updated lumbar MRI.  - Follow up with me in 6-8 weeks and prn.   BP was elevated. No symptoms of chest pain, shortness of breath, blurry vision, or headaches. He will recheck at home and call PCP if not improved. If he develops CP, SOB, blurry vision, or headaches, then he will go to ED. Message sent to PCP to advise them  of elevated BP reading.    I spent a total of 45 minutes in face-to-face and non-face-to-face activities related to this patient's care today including review of outside records, review of imaging, review of symptoms, physical exam, discussion of differential diagnosis, discussion of treatment options, and documentation.   Thank you for involving me in the care of this patient.   Joseph Boys PA-C Dept. of Neurosurgery

## 2024-05-15 ENCOUNTER — Telehealth: Payer: Self-pay | Admitting: *Deleted

## 2024-05-15 ENCOUNTER — Encounter: Payer: Self-pay | Admitting: Internal Medicine

## 2024-05-15 NOTE — Progress Notes (Unsigned)
 Complex Care Management Note Care Guide Note  05/15/2024 Name: Joseph Williamson MRN: [id number] DOB: 1970-10-11   Complex Care Management Outreach Attempts: An unsuccessful telephone outreach was attempted today to offer the patient information about available complex care management services.  Follow Up Plan:  Additional outreach attempts will be made to offer the patient complex care management information and services.   Encounter Outcome:  No Answer  Thedford Garlington, CMA [place]  [facility], [facility] Guide Direct Dial: [phone]  Fax: [phone] Website: [email or web address]

## 2024-05-16 ENCOUNTER — Encounter: Payer: Self-pay | Admitting: *Deleted

## 2024-05-16 NOTE — Progress Notes (Unsigned)
 Complex Care Management Note Care Guide Note  05/16/2024 Name: Joseph Williamson MRN: [id number] DOB: 11-06-1970   Complex Care Management Outreach Attempts: A second unsuccessful outreach was attempted today to offer the patient with information about available complex care management services.  Follow Up Plan:  Additional outreach attempts will be made to offer the patient complex care management information and services.   Encounter Outcome:  No Answer  Thedford Mura, CMA [place]  [facility], [facility] Guide Direct Dial: [phone]  Fax: [phone] Website: [email or web address]

## 2024-05-17 ENCOUNTER — Encounter: Payer: Self-pay | Admitting: Orthopedic Surgery

## 2024-05-17 ENCOUNTER — Ambulatory Visit: Admitting: Orthopedic Surgery

## 2024-05-17 VITALS — BP 150/110 | Ht 73.0 in | Wt 225.0 lb

## 2024-05-17 DIAGNOSIS — M48061 Spinal stenosis, lumbar region without neurogenic claudication: Secondary | ICD-10-CM

## 2024-05-17 DIAGNOSIS — M5416 Radiculopathy, lumbar region: Secondary | ICD-10-CM

## 2024-05-17 DIAGNOSIS — M47816 Spondylosis without myelopathy or radiculopathy, lumbar region: Secondary | ICD-10-CM

## 2024-05-17 DIAGNOSIS — M51362 Other intervertebral disc degeneration, lumbar region with discogenic back pain and lower extremity pain: Secondary | ICD-10-CM | POA: Diagnosis not present

## 2024-05-17 NOTE — Patient Instructions (Signed)
 It was so nice to see you today. Thank you so much for coming in.    You have wear and tear in your back with disc bulging, degeneration of the discs, and spinal stenosis.   I think your current pain is from L5-S1.   I put in fast track order for right sided injectionwith physical medicine and rehab at the [facility]. Dr. Avanell, Dr. Dodson, and their NP [place] are great and will take good care of you. They should call you to schedule an appointment or you can call them at [phone].   If injection does not help with pain, then we will consider getting updated lumbar MRI.   Your blood pressure was elevated today. I want you to recheck it at home and follow up with your PCP if it remains high. If you have any chest pain, shortness of breath, blurry vision, or headaches then you need to go to ED. We have also sent your PCP a message to let them know about your elevated blood pressure.    I will see you back in 6-8 weeks. Please do not hesitate to call if you have any questions or concerns. You can also message me in MyChart.   Glade Boys PA-C [phone]     The physicians and staff at [facility], [facility] at [facility] are committed to providing excellent care. You may receive a survey asking for feedback about your experience at our office. We value you your feedback and appreciate you taking the time to to fill it out. The [facility] leadership team is also available to discuss your experience in person, feel free to contact us  [phone].

## 2024-05-17 NOTE — Progress Notes (Signed)
 Complex Care Management Note  Care Guide Note 05/17/2024 Name: Joseph Williamson MRN: [id number] DOB: [birth year]  Joseph Williamson is a 53 y.o. year old male who sees Jesus Bernardino MATSU, MD for primary care. I reached out to Prentice LOISE Igo by phone today to offer complex care management services.  [name] was given information about Complex Care Management services today including:   The Complex Care Management services include support from the care team which includes your Nurse Care Manager, Clinical Social Worker, or Pharmacist.  The Complex Care Management team is here to help remove barriers to the health concerns and goals most important to you. Complex Care Management services are voluntary, and the patient may decline or stop services at any time by request to their care team member.   Complex Care Management Consent Status: Patient agreed to services and verbal consent obtained.   Follow up plan:  Telephone appointment with complex care management team member scheduled for:  05/18/2024 and 05/22/2024  Encounter Outcome:  Patient Scheduled  Thedford Jares, CMA [place]  [facility], [facility] Guide Direct Dial: [phone]  Fax: [phone] Website: [email or web address]

## 2024-05-17 NOTE — Progress Notes (Signed)
 Complex Care Management Note Care Guide Note  05/17/2024 Name: Joseph Williamson MRN: [id number] DOB: Apr 16, 1971   Complex Care Management Outreach Attempts: A third unsuccessful outreach was attempted today to offer the patient with information about available complex care management services.  Follow Up Plan:  No further outreach attempts will be made at this time. We have been unable to contact the patient to offer or enroll patient in complex care management services.  Encounter Outcome:  No Answer  Thedford Scheidegger, CMA [place]  [facility], [facility] Guide Direct Dial: [phone]  Fax: [phone] Website: [email or web address]

## 2024-05-18 ENCOUNTER — Telehealth: Payer: Self-pay

## 2024-05-18 ENCOUNTER — Emergency Department (HOSPITAL_BASED_OUTPATIENT_CLINIC_OR_DEPARTMENT_OTHER)

## 2024-05-18 ENCOUNTER — Other Ambulatory Visit: Payer: Self-pay

## 2024-05-18 ENCOUNTER — Encounter: Payer: Self-pay | Admitting: Internal Medicine

## 2024-05-18 ENCOUNTER — Emergency Department (HOSPITAL_BASED_OUTPATIENT_CLINIC_OR_DEPARTMENT_OTHER)
Admission: EM | Admit: 2024-05-18 | Discharge: 2024-05-18 | Disposition: A | Discharge status: Home / Self Care | Source: Ambulatory Visit | Attending: Emergency Medicine | Admitting: Emergency Medicine

## 2024-05-18 DIAGNOSIS — Z79899 Other long term (current) drug therapy: Secondary | ICD-10-CM | POA: Insufficient documentation

## 2024-05-18 DIAGNOSIS — R0789 Other chest pain: Secondary | ICD-10-CM | POA: Diagnosis not present

## 2024-05-18 DIAGNOSIS — I10 Essential (primary) hypertension: Secondary | ICD-10-CM | POA: Diagnosis not present

## 2024-05-18 DIAGNOSIS — Z8616 Personal history of COVID-19: Secondary | ICD-10-CM | POA: Diagnosis not present

## 2024-05-18 LAB — BASIC METABOLIC PANEL WITH GFR
Anion gap: 10 (ref 5–15)
BUN: 21 mg/dL — ABNORMAL HIGH (ref 6–20)
CO2: 29 mmol/L (ref 22–32)
Calcium: 10.8 mg/dL — ABNORMAL HIGH (ref 8.9–10.3)
Chloride: 103 mmol/L (ref 98–111)
Creatinine, Ser: 1 mg/dL (ref 0.61–1.24)
GFR, Estimated: >60 mL/min (ref >60)
Glucose, Bld: 99 mg/dL (ref 70–99)
Potassium: 4.4 mmol/L (ref 3.5–5.1)
Sodium: 142 mmol/L (ref 135–145)

## 2024-05-18 LAB — CBC
HCT: 40.3 % (ref 39.0–52.0)
Hemoglobin: 14.2 g/dL (ref 13.0–17.0)
MCH: 30.5 pg (ref 26.0–34.0)
MCHC: 35.2 g/dL (ref 30.0–36.0)
MCV: 86.7 fL (ref 80.0–100.0)
Platelets: 308 K/uL (ref 150–400)
RBC: 4.65 MIL/uL (ref 4.22–5.81)
RDW: 12.3 % (ref 11.5–15.5)
WBC: 9.6 K/uL (ref 4.0–10.5)
nRBC: 0 % (ref 0.0–0.2)

## 2024-05-18 LAB — TROPONIN T, HIGH SENSITIVITY: Troponin T High Sensitivity: <15 ng/L (ref 0–19)

## 2024-05-18 MED ORDER — LISINOPRIL 10 MG PO TABS: 10.0000 mg | ORAL_TABLET | Freq: Every day | ORAL | 0 refills | Status: DC

## 2024-05-18 NOTE — Addendum Note (Signed)
 Addended by: Nayib Remer G on: 05/18/2024 01:33 PM   Modules accepted: Orders

## 2024-05-18 NOTE — Patient Instructions (Signed)
 Joseph Williamson - I am sorry I was unable to reach you today for our scheduled appointment. I work with Jesus Bernardino MATSU, MD and am calling to support your healthcare needs. Please contact me at [phone] at your earliest convenience. I look forward to speaking with you soon.   Thank you,  Tillman Gardener, BSW [place]  [facility], [facility] Social Worker Direct Dial: [phone]  Fax: [phone] Website: [email or web address]

## 2024-05-18 NOTE — ED Triage Notes (Signed)
 Patient states hypertension since last week. Reports 191/112 at home.

## 2024-05-18 NOTE — ED Provider Notes (Signed)
 [place] [facility] DEPARTMENT AT [facility] Provider Note   CSN: [id number] Arrival date & time: 05/18/24  9075     Patient presents with: Hypertension   Joseph Williamson is a 53 y.o. male with history of lumbar DJD with sciatica, prediabetes, hypertension, hyperlipidemia presents with complaints of elevated blood pressure.  Reports over the past few days his pressure has been notably elevated.  Reports diastolic of 140.  Does complain of ongoing dull headache without any associated blurry vision, dizziness, extremity weakness.  Does report ongoing right lower extremity numbness and tingling.  Reports that this is chronic however and attributed to his sciatica.  Reports that he is under quite a bit of stress and has had worsening back pain recently.  His blood pressures typically under control after losing weight.  However during his last ER visit when he was diagnosed with pyelonephritis he was noted to be hypertensive at that time as well.  Patient does endorse some chest tightness that started last night.  Is not associate with any shortness of breath.  Has been intermittent without any aggravating relieving factors.    Hypertension   Past Medical History:  Diagnosis Date   Arthritis    Back pain    Cluster headaches    COVID-19 07/11/2021   History of chickenpox    Hypertension    Perforation of right tympanic membrane 01/07/2021   Primary hypertension 09/11/2019   Recurrent acute serous otitis media of right ear 07/01/2021   Past Surgical History:  Procedure Laterality Date   none     TRIGGER FINGER RELEASE Right 04/13/2023         Prior to Admission medications   Medication Sig Start Date End Date Taking? Authorizing Provider  lisinopril  (ZESTRIL ) 10 MG tablet Take 1 tablet (10 mg total) by mouth daily. 05/18/24  Yes Donnajean Lynwood DEL, PA-C  ALPRAZolam  (XANAX ) 0.5 MG tablet Take 1 tablet (0.5 mg total) by mouth 2 (two) times daily as needed for anxiety (do  not take within 4 hours of opioid). 05/11/24   Jesus Bernardino MATSU, MD  ARIPiprazole  (ABILIFY ) 5 MG tablet Take 1 tablet (5 mg total) by mouth daily. 05/11/24   Jesus Bernardino MATSU, MD  FLUoxetine  (PROZAC ) 20 MG capsule Take 1 capsule (20 mg total) by mouth daily. 05/11/24   Jesus Bernardino MATSU, MD  gabapentin  (NEURONTIN ) 300 MG capsule Take 2 capsules (600 mg total) by mouth 3 (three) times daily. 04/27/24   Jesus Bernardino MATSU, MD  HYDROcodone -acetaminophen  (NORCO) 10-325 MG tablet Take 1 tablet by mouth every 4 (four) hours as needed for up to 5 days. 05/11/24 05/17/24  Jesus Bernardino MATSU, MD    Allergies: Patient has no known allergies.    Review of Systems  Respiratory:  Positive for chest tightness.     Updated Vital Signs BP (!) 152/100   Pulse 62   Temp 98.2 F (36.8 C) (Oral)   Resp 10   SpO2 95%   Physical Exam Vitals and nursing note reviewed.  Constitutional:      General: He is not in acute distress.    Appearance: He is well-developed.  HENT:     Head: Normocephalic and atraumatic.  Eyes:     Conjunctiva/sclera: Conjunctivae normal.  Cardiovascular:     Rate and Rhythm: Normal rate and regular rhythm.     Heart sounds: No murmur heard. Pulmonary:     Effort: Pulmonary effort is normal. No respiratory distress.     Breath sounds:  Normal breath sounds.  Abdominal:     Palpations: Abdomen is soft.     Tenderness: There is no abdominal tenderness.  Musculoskeletal:        General: No swelling.     Cervical back: Neck supple.     Comments: +SLR  Skin:    General: Skin is warm and dry.     Capillary Refill: Capillary refill takes less than 2 seconds.  Neurological:     Mental Status: He is alert.     Comments: Patient is alert and oriented. There is no abnormal phonation. Symmetric smile without facial droop.. Moves all extremities spontaneously. 5/5 strength in upper and lower extremities. . No sensation deficit. There is no nystagmus. EOMI, PERRL. Coordination intact with  finger to nose and normal ambulation.    Psychiatric:        Mood and Affect: Mood normal.     (all labs ordered are listed, but only abnormal results are displayed) Labs Reviewed  BASIC METABOLIC PANEL WITH GFR - Abnormal; Notable for the following components:      Result Value   BUN 21 (*)    Calcium 10.8 (*)    All other components within normal limits  CBC  TROPONIN T, HIGH SENSITIVITY  TROPONIN T, HIGH SENSITIVITY    EKG: EKG Interpretation Date/Time:  Thursday May 18 2024 10:27:04 EST Ventricular Rate:  64 PR Interval:  163 QRS Duration:  107 QT Interval:  419 QTC Calculation: 433 R Axis:   30  Text Interpretation: Sinus rhythm Probable left atrial enlargement RSR' in V1 or V2, right VCD or RVH Confirmed by Mannie Pac [phone]) on 05/18/2024 10:35:09 AM  Radiology: [facility] Chest Port 1 View Result Date: 05/18/2024 EXAM: 1 VIEW(S) XRAY OF THE CHEST 05/18/2024 10:36:00 AM COMPARISON: None available. CLINICAL HISTORY: Chest tightness FINDINGS: LUNGS AND PLEURA: No focal pulmonary opacity. No pleural effusion. No pneumothorax. HEART AND MEDIASTINUM: No acute abnormality of the cardiac and mediastinal silhouettes. BONES AND SOFT TISSUES: No acute osseous abnormality. IMPRESSION: 1. No acute cardiopulmonary process. Electronically signed by: Ryan Salvage MD 05/18/2024 11:23 AM EST RP Workstation: [id number]     Procedures   Medications Ordered in the ED - No data to display  Clinical Course as of 05/18/24 1222  Thu May 18, 2024  1026 Patient with multiple cardiovascular risk factors evaluated for elevated blood pressure over the past couple weeks with associated chest tightness that started last night as well as ongoing dull headache without any other concerning neurological symptoms.  Upon arrival patient is hypertensive to 177/123.  His pressure improves at rest to 165/91.  He has no neurodeficits on exam.  Does have a positive right straight leg raise.  His lab  work from a week ago was without any evidence of endorgan damage.  However given chest tightness in the setting of significant hypertension recordings at home as well here in the ED will repeat lab work and obtain cardiac workup [JT]  1056 EKG 12-Lead Normal sinus rhythm, nonspecific T wave abnormality [JT]  1123 CBC Unremarkable [JT]  1209 Basic metabolic panel(!) No significant abnormality [JT]  1209 Troponin T, High Sensitivity Without elevation [JT]  1209 DG Chest Port 1 View No acute cardiopulmonary disease [JT]  1212 Workup overall reassuring.  Will send in prescription for lisinopril  as she has multiple recordings of hypertension over the past couple weeks.  Encouraged to follow-up with PCP for further management.  Strict return precautions provided.  Patient is understanding agreement with plan. [  JT]    Clinical Course User Index [JT] Donnajean Lynwood DEL, PA-C                                 Medical Decision Making Amount and/or Complexity of Data Reviewed Labs: ordered. Decision-making details documented in ED Course. Radiology: ordered. Decision-making details documented in ED Course. ECG/medicine tests:  Decision-making details documented in ED Course.  Risk Prescription drug management.   This patient presents to the ED with chief complaint(s) of hypertension and chest tightness.  The complaint involves an extensive differential diagnosis and also carries with it a high risk of complications and morbidity.   Pertinent past medical history as listed in HPI  The differential diagnosis includes  Do not suspect ACS as patient is without any EKG changes or troponin elevation.  Do not suspect CVA or TIA.  No evidence of endorgan damage on lab work. Additional history obtained: Records reviewed Care Everywhere/External Records  Disposition:   Patient will be discharged home. The patient has been appropriately medically screened and/or stabilized in the ED. I have low suspicion  for any other emergent medical condition which would require further screening, evaluation or treatment in the ED or require inpatient management. At time of discharge the patient is hemodynamically stable and in no acute distress. I have discussed work-up results and diagnosis with patient and answered all questions. Patient is agreeable with discharge plan. We discussed strict return precautions for returning to the emergency department and they verbalized understanding.     Social Determinants of Health:   none  This note was dictated with voice recognition software.  Despite best efforts at proofreading, errors may have occurred which can change the documentation meaning.       Final diagnoses:  Uncontrolled hypertension    ED Discharge Orders          Ordered    lisinopril  (ZESTRIL ) 10 MG tablet  Daily        05/18/24 1216               Donnajean Lynwood DEL, [place] 05/18/24 1222    Mannie Pac T, DO 05/19/24 0845

## 2024-05-18 NOTE — Discharge Instructions (Signed)

## 2024-05-18 NOTE — Telephone Encounter (Signed)
 Tried to call pt to advise him per Dr Jesus to go to the ED to get check out

## 2024-05-22 ENCOUNTER — Encounter: Payer: Self-pay | Admitting: Internal Medicine

## 2024-05-22 ENCOUNTER — Telehealth: Payer: Self-pay

## 2024-05-22 ENCOUNTER — Telehealth: Admitting: Internal Medicine

## 2024-05-22 DIAGNOSIS — I10 Essential (primary) hypertension: Secondary | ICD-10-CM

## 2024-05-22 DIAGNOSIS — F4322 Adjustment disorder with anxiety: Secondary | ICD-10-CM | POA: Diagnosis not present

## 2024-05-22 DIAGNOSIS — M5416 Radiculopathy, lumbar region: Secondary | ICD-10-CM

## 2024-05-22 DIAGNOSIS — M5116 Intervertebral disc disorders with radiculopathy, lumbar region: Secondary | ICD-10-CM

## 2024-05-22 MED ORDER — IBUPROFEN 800 MG PO TABS: 800.0000 mg | ORAL_TABLET | Freq: Three times a day (TID) | ORAL | 4 refills | Status: AC | PRN

## 2024-05-22 MED ORDER — HYDROCODONE-ACETAMINOPHEN 10-325 MG PO TABS: 1.0000 | ORAL_TABLET | ORAL | 0 refills | Status: DC | PRN

## 2024-05-22 MED ORDER — LISINOPRIL 20 MG PO TABS: 20.0000 mg | ORAL_TABLET | Freq: Every day | ORAL | 3 refills | Status: AC

## 2024-05-22 MED ORDER — PROPRANOLOL HCL 10 MG PO TABS: 10.0000 mg | ORAL_TABLET | Freq: Three times a day (TID) | ORAL | 3 refills | Status: DC

## 2024-05-22 NOTE — Patient Instructions (Signed)
 It was a pleasure seeing you today! Your health and satisfaction are our top priorities.  Joseph Cone, MD  VISIT SUMMARY: Today, you came in for pain management and a review of your medications. We discussed your persistent back pain, elevated blood pressure, and anxiety. We reviewed your current medications and made some adjustments to better manage your symptoms.  YOUR PLAN: -CHRONIC LOW BACK PAIN WITH BILATERAL SCIATICA AND DEGENERATIVE LUMBAR DISC DISEASE WITH RADICULOPATHY: This condition involves ongoing lower back pain that radiates down both legs, caused by a damaged disc and nerve issues. We will continue using hydrocodone  for short-term pain relief, along with gabapentin  and ibuprofen  to manage your pain. We discussed potential neurosurgical options, including injections, laminectomy, and disc replacement, if needed.  -ESSENTIAL HYPERTENSION: This means your blood pressure is consistently higher than normal. We increased your lisinopril  dose to 20 mg daily and added propranolol  to help manage your blood pressure and reduce stress-related symptoms. Please monitor your blood pressure regularly.  -ADJUSTMENT DISORDER WITH ANXIETY: This condition involves significant anxiety and stress, affecting your daily life. We will continue with Abilify  for mood stabilization and added propranolol  to help manage physical symptoms of anxiety, such as elevated heart rate and blood pressure.  INSTRUCTIONS: Please monitor your blood pressure regularly and take your medications as prescribed. Follow up with us  if you experience any new or worsening symptoms. We will continue to explore neurosurgical options for your back pain if necessary.  Your Providers PCP: Joseph Williamson MATSU, MD,  [phone]) Referring Provider: [name], MD,  [phone])  NEXT STEPS: [x]  Early Intervention: Schedule sooner appointment, call our on-call services, or go to emergency room if there is any  significant Increase in pain or discomfort New or worsening symptoms Sudden or severe changes in your health [x]  Flexible Follow-Up: We recommend a No follow-ups on file. for optimal routine care. This allows for progress monitoring and treatment adjustments. [x]  Preventive Care: Schedule your annual preventive care visit! It's typically covered by insurance and helps identify potential health issues early. [x]  Lab & X-ray Appointments: Incomplete tests scheduled today, or call to schedule. X-rays: [facility] (M-F, 8:30am-noon or 1pm-5pm). [x]  Medical Information Release: Sign a release form at front desk to obtain relevant medical information we don't have.  MAKING THE MOST OF OUR FOCUSED 20 MINUTE APPOINTMENTS: [x]   Clearly state your top concerns at the beginning of the visit to focus our discussion [x]   If you anticipate you will need more time, please inform the front desk during scheduling - we can book multiple appointments in the same week. [x]   If you have transportation problems- use our convenient video appointments or ask about transportation support. [x]   We can get down to business faster if you use MyChart to update information before the visit and submit non-urgent questions before your visit. Thank you for taking the time to provide details through MyChart.  Let our nurse know and she can import this information into your encounter documents.  Arrival and Wait Times: [x]   Arriving on time ensures that everyone receives prompt attention. [x]   Early morning (8a) and afternoon (1p) appointments tend to have shortest wait times. [x]   Unfortunately, we cannot delay appointments for late arrivals or hold slots during phone calls.  Getting Answers and Following Up [x]   Simple Questions & Concerns: For quick questions or basic follow-up after your visit, reach us  at [phone] or MyChart messaging. [x]   Complex Concerns: If your concern is  more complex, scheduling  an appointment might be best. Discuss this with the staff to find the most suitable option. [x]   Lab & Imaging Results: We'll contact you directly if results are abnormal or you don't use MyChart. Most normal results will be on MyChart within 2-3 business days, with a review message from Dr. Jesus. Haven't heard back in 2 weeks? Need results sooner? Contact us  at [phone]. [x]   Referrals: Our referral coordinator will manage specialist referrals. The specialist's office should contact you within 2 weeks to schedule an appointment. Call us  if you haven't heard from them after 2 weeks.  Staying Connected [x]   MyChart: Activate your MyChart for the fastest way to access results and message us . See the last page of this paperwork for instructions on how to activate.  Bring to Your Next Appointment [x]   Medications: Please bring all your medication bottles to your next appointment to ensure we have an accurate record of your prescriptions. [x]   Health Diaries: If you're monitoring any health conditions at home, keeping a diary of your readings can be very helpful for discussions at your next appointment.  Billing [x]   X-ray & Lab Orders: These are billed by separate companies. Contact the invoicing company directly for questions or concerns. [x]   Visit Charges: Discuss any billing inquiries with our administrative services team.  Your Satisfaction Matters [x]   Share Your Experience: We strive for your satisfaction! If you have any complaints, or preferably compliments, please let Dr. Jesus know directly or contact our Practice Administrators, Manuelita Rubin or [employer], by asking at the front desk.   Reviewing Your Records [x]   Review this early draft of your clinical encounter notes below and the final encounter summary tomorrow on MyChart after its been completed.  All orders placed so far are visible here: Primary hypertension -     Lisinopril ; Take 1 tablet (20 mg total) by mouth  daily. Replaces 10 mg dosing.  Dispense: 90 tablet; Refill: 3  Sciatica of right side due to displacement of lumbar intervertebral disc -     HYDROcodone -Acetaminophen ; Take 1 tablet by mouth every 4 (four) hours as needed for up to 10 days.  Dispense: 60 tablet; Refill: 0 -     Ibuprofen ; Take 1 tablet (800 mg total) by mouth every 8 (eight) hours as needed.  Dispense: 90 tablet; Refill: 4  Adjustment disorder with anxiety -     Propranolol  HCl; Take 1 tablet (10 mg total) by mouth 3 (three) times daily.  Dispense: 90 tablet; Refill: 3  Lumbar radiculopathy

## 2024-05-22 NOTE — Progress Notes (Signed)
 ====================================  [facility]: [phone]   --  Virtual Video Medical Office Visit --  Patient: Joseph Williamson      Age: 53 y.o.       Sex:  male  Date:   05/22/2024 Today's Healthcare Provider: Bernardino KANDICE Cone, MD  ====================================   Chief Complaint/Reason For Visit: Hypertension Chart reviewed: has Former smoker; Rhinitis medicamentosa; Dysfunction of both eustachian tubes; Bilateral hearing loss; Generalized osteoarthritis; Lumbar radiculopathy; Chronic swimmer's ear of right side; Non-seasonal allergic rhinitis; Overweight (BMI 25.0-29.9); Acquired trigger finger; Prediabetes; Hyperlipidemia; History of perforation of tympanic membrane; FH: prostate cancer; Skin lesion; Dermatitis; Left tennis elbow; Suspected sleep apnea; Chronic bilateral low back pain with bilateral sciatica; BRBPR (bright red blood per rectum); Cellulitis of abdominal wall; Renal mass; Pyelonephritis; and Hypoglycemia on their problem list. Chart reviewed:  has a past medical history of Arthritis, Back pain, Cluster headaches, COVID-19 (07/11/2021), History of chickenpox, Hypertension, Perforation of right tympanic membrane (01/07/2021), Primary hypertension (09/11/2019), and Recurrent acute serous otitis media of right ear (07/01/2021). Discussed the use of AI scribe software for clinical note transcription with the patient, who gave verbal consent to proceed.  History of Present Illness 53 year old male who presents for pain management and medication review.  He experiences persistent back pain, primarily described as a dull ache with occasional shooting pains when moving incorrectly. The pain worsens when the effects of ibuprofen  wear off, leading to a burning sensation and difficulty moving, significantly disrupting his sleep. The back pain originated in 2020 after stepping down from a ladder, resulting in a blown disc and crushed nerve.  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 Pain management has included opioids as a bridge to neurosurgical intervention. He is currently taking hydrocodone , which provides 70-80% pain relief, allowing him to sleep and perform daily activities. He has been using approximately three tablets a day, stretching a five-day supply over eight to nine days.  He recalls a recent MRI from last December, prior to receiving injections in February, and hopes that a new MRI will not be required to expedite further injections.  He reports elevated blood pressure, with a recent reading of 191/115, causing significant anxiety. He has been prescribed gabapentin  and over-the-counter ibuprofen  for pain management. He mentions having been prescribed Xanax  and Abilify  for anxiety and stress management. He describes feeling 'on the ledge.' Stressed badly financially and health; worried he might not be here much longer although cardiovascular workup was fine.   Medications reviewed: Current Outpatient Medications on File Prior to Visit  Medication Sig   ALPRAZolam  (XANAX ) 0.5 MG tablet Take 1 tablet (0.5 mg total) by mouth 2 (two) times daily as needed for anxiety (do not take within 4 hours of opioid).   ARIPiprazole  (ABILIFY ) 5 MG tablet Take 1 tablet (5 mg total) by mouth daily.   FLUoxetine  (PROZAC ) 20 MG capsule Take 1 capsule (20 mg total) by mouth daily.   gabapentin  (NEURONTIN ) 300 MG capsule Take 2 capsules (600 mg total) by mouth 3 (three) times daily.   No current facility-administered medications on file prior to visit.   Medications Discontinued During This Encounter  Medication Reason   lisinopril  (ZESTRIL ) 10 MG tablet    HYDROcodone -acetaminophen  (NORCO) 10-325 MG tablet Reorder       Virtual Physical Exam: Exam Context: Evaluation limited by virtual format; however, patient is clearly visualized, cooperative, and engaged throughout. General Appearance: Well-developed, well-nourished;  no acute distress by limited video assessment.  Pulmonary: No respiratory distress apparent; normal work of breathing observed. Neurological: Patient is awake, alert, and demonstrates no obvious focal neurological deficits or cognitive impairments; sensorium appears unclouded. Psychiatric/Mental Status: Mood is appropriate; demeanor is pleasant, calm, and articulate. Speech is coherent and goal-directed with no evidence of slurred or pressured speech. No abnormal psychomotor activity noted. Substance Misuse Indicators: Pupils appear symmetric and reactive as far as can be assessed via video. No track marks, skin lesions, or other stigmata of substance misuse visible. No signs of intoxication or withdrawal are evident.  Admission on 05/18/2024, Discharged on 05/18/2024  Component Date Value   Sodium 05/18/2024 142    Potassium 05/18/2024 4.4    Chloride 05/18/2024 103    CO2 05/18/2024 29    Glucose, Bld 05/18/2024 99    BUN 05/18/2024 21 (H)    Creatinine, Ser 05/18/2024 1.00    Calcium 05/18/2024 10.8 (H)    GFR, Estimated 05/18/2024 >60    Anion gap 05/18/2024 10    WBC 05/18/2024 9.6    RBC 05/18/2024 4.65    Hemoglobin 05/18/2024 14.2    HCT 05/18/2024 40.3    MCV 05/18/2024 86.7    MCH 05/18/2024 30.5    MCHC 05/18/2024 35.2    RDW 05/18/2024 12.3    Platelets 05/18/2024 308    nRBC 05/18/2024 0.0    Troponin T High Sensitiv* 05/18/2024 <15   Office Visit on 05/11/2024  Component Date Value   Sodium 05/11/2024 143    Potassium 05/11/2024 4.6    Chloride 05/11/2024 105    CO2 05/11/2024 23    Glucose, Bld 05/11/2024 48 (LL)    BUN 05/11/2024 21    Creatinine, Ser 05/11/2024 1.18    Total Bilirubin 05/11/2024 0.3    Alkaline Phosphatase 05/11/2024 74    AST 05/11/2024 24    ALT 05/11/2024 30    Total Protein 05/11/2024 7.5    Albumin 05/11/2024 4.4    GFR 05/11/2024 71.41    Calcium 05/11/2024 10.3    Color, Urine 05/11/2024 DARK YELLOW    APPearance 05/11/2024 CLEAR     Specific Gravity, Urine 05/11/2024 1.028    pH 05/11/2024 6.5    Glucose, UA 05/11/2024 NEGATIVE    Bilirubin Urine 05/11/2024 NEGATIVE    Ketones, ur 05/11/2024 NEGATIVE    Hgb urine dipstick 05/11/2024 NEGATIVE    Protein, ur 05/11/2024 NEGATIVE    Nitrites, Initial 05/11/2024 NEGATIVE    Leukocyte Esterase 05/11/2024 NEGATIVE    Note 05/11/2024     Hgb A1c MFr Bld 05/11/2024 5.6    Cholesterol 05/11/2024 172    Triglycerides 05/11/2024 112.0    HDL 05/11/2024 31.30 (L)    VLDL 05/11/2024 22.4    LDL Cholesterol 05/11/2024 118 (H)    Total CHOL/HDL Ratio 05/11/2024 5    NonHDL 05/11/2024 140.23    WBC 05/11/2024 8.2    RBC 05/11/2024 4.24    Hemoglobin 05/11/2024 12.9 (L)    Hematocrit 05/11/2024 39.1    MCV 05/11/2024 92    MCH 05/11/2024 30.4    MCHC 05/11/2024 33.0    RDW 05/11/2024 12.2    Platelets 05/11/2024 283    Neutrophils 05/11/2024 53    Lymphs 05/11/2024 34    Monocytes 05/11/2024 7    Eos 05/11/2024 4    Basos 05/11/2024 1    Neutrophils Absolute 05/11/2024 4.4    Lymphocytes Absolute 05/11/2024 2.8    Monocytes Absolute 05/11/2024 0.6    EOS (ABSOLUTE) 05/11/2024 0.3  Basophils Absolute 05/11/2024 0.1    Immature Granulocytes 05/11/2024 1    Immature Grans (Abs) 05/11/2024 0.1    Reflexve Urine Culture 05/11/2024    Admission on 05/04/2024, Discharged on 05/04/2024  Component Date Value   Lipase 05/04/2024 17    Sodium 05/04/2024 140    Potassium 05/04/2024 4.7    Chloride 05/04/2024 102    CO2 05/04/2024 30    Glucose, Bld 05/04/2024 110 (H)    BUN 05/04/2024 22 (H)    Creatinine, Ser 05/04/2024 1.17    Calcium 05/04/2024 10.7 (H)    Total Protein 05/04/2024 7.5    Albumin 05/04/2024 4.7    AST 05/04/2024 25    ALT 05/04/2024 25    Alkaline Phosphatase 05/04/2024 86    Total Bilirubin 05/04/2024 0.4    GFR, Estimated 05/04/2024 >60    Anion gap 05/04/2024 8    WBC 05/04/2024 12.3 (H)    RBC 05/04/2024 4.66    Hemoglobin 05/04/2024  14.2    HCT 05/04/2024 40.9    MCV 05/04/2024 87.8    MCH 05/04/2024 30.5    MCHC 05/04/2024 34.7    RDW 05/04/2024 12.2    Platelets 05/04/2024 191    nRBC 05/04/2024 0.0    Color, Urine 05/04/2024 YELLOW    APPearance 05/04/2024 CLEAR    Specific Gravity, Urine 05/04/2024 1.025    pH 05/04/2024 6.0    Glucose, UA 05/04/2024 NEGATIVE    Hgb urine dipstick 05/04/2024 NEGATIVE    Bilirubin Urine 05/04/2024 NEGATIVE    Ketones, ur 05/04/2024 NEGATIVE    Protein, ur 05/04/2024 NEGATIVE    Nitrite 05/04/2024 NEGATIVE    Leukocytes,Ua 05/04/2024 NEGATIVE   Appointment on 05/03/2024  Component Date Value   Sodium 05/03/2024 141    Potassium 05/03/2024 4.4    Chloride 05/03/2024 103    CO2 05/03/2024 30    Glucose, Bld 05/03/2024 87    BUN 05/03/2024 24 (H)    Creatinine, Ser 05/03/2024 1.25    GFR 05/03/2024 66.65    Calcium 05/03/2024 9.6    WBC 05/03/2024 8.1    RBC 05/03/2024 4.66    Hemoglobin 05/03/2024 14.2    HCT 05/03/2024 41.4    MCV 05/03/2024 89.0    MCHC 05/03/2024 34.2    RDW 05/03/2024 13.0    Platelets 05/03/2024 187.0    Neutrophils Relative % 05/03/2024 55.9    Lymphocytes Relative 05/03/2024 33.8    Monocytes Relative 05/03/2024 7.3    Eosinophils Relative 05/03/2024 2.3    Basophils Relative 05/03/2024 0.7    Neutro Abs 05/03/2024 4.6    Lymphs Abs 05/03/2024 2.8    Monocytes Absolute 05/03/2024 0.6    Eosinophils Absolute 05/03/2024 0.2    Basophils Absolute 05/03/2024 0.1   Office Visit on 05/01/2024  Component Date Value   Color, Urine 05/01/2024 YELLOW    APPearance 05/01/2024 CLEAR    Specific Gravity, Urine 05/01/2024 1.020    pH 05/01/2024 6.5    Total Protein, Urine 05/01/2024 NEGATIVE    Urine Glucose 05/01/2024 NEGATIVE    Ketones, ur 05/01/2024 NEGATIVE    Bilirubin Urine 05/01/2024 NEGATIVE    Hgb urine dipstick 05/01/2024 NEGATIVE    Urobilinogen, UA 05/01/2024 0.2    Leukocytes,Ua 05/01/2024 NEGATIVE    Nitrite 05/01/2024  NEGATIVE    WBC, UA 05/01/2024 0-2/hpf    RBC / HPF 05/01/2024 none seen    Squamous Epithelial / HPF 05/01/2024 Rare(0-4/hpf)    MICRO NUMBER: 05/01/2024 [id number]    SPECIMEN  QUALITY: 05/01/2024 Adequate    Sample Source 05/01/2024 URINE    STATUS: 05/01/2024 FINAL    Result: 05/01/2024 No Growth    WBC 05/01/2024 8.1    RBC 05/01/2024 4.44    Hemoglobin 05/01/2024 13.6    HCT 05/01/2024 39.6    MCV 05/01/2024 89.0    MCHC 05/01/2024 34.4    RDW 05/01/2024 12.9    Platelets 05/01/2024 187.0    Neutrophils Relative % 05/01/2024 54.7    Lymphocytes Relative 05/01/2024 34.9    Monocytes Relative 05/01/2024 7.0    Eosinophils Relative 05/01/2024 2.6    Basophils Relative 05/01/2024 0.8    Neutro Abs 05/01/2024 4.4    Lymphs Abs 05/01/2024 2.8    Monocytes Absolute 05/01/2024 0.6    Eosinophils Absolute 05/01/2024 0.2    Basophils Absolute 05/01/2024 0.1    Sodium 05/01/2024 143    Potassium 05/01/2024 5.3 No hemolysis seen (H)    Chloride 05/01/2024 104    CO2 05/01/2024 32    Glucose, Bld 05/01/2024 80    BUN 05/01/2024 19    Creatinine, Ser 05/01/2024 1.26    Total Bilirubin 05/01/2024 0.3    Alkaline Phosphatase 05/01/2024 77    AST 05/01/2024 28    ALT 05/01/2024 25    Total Protein 05/01/2024 7.0    Albumin 05/01/2024 4.6    GFR 05/01/2024 66.02    Calcium 05/01/2024 9.8    Lipase 05/01/2024 20.0   Office Visit on 11/19/2023  Component Date Value   Cholesterol 11/19/2023 186    Triglycerides 11/19/2023 66.0    HDL 11/19/2023 51.40    VLDL 11/19/2023 13.2    LDL Cholesterol 11/19/2023 122 (H)    Total CHOL/HDL Ratio 11/19/2023 4    NonHDL 11/19/2023 134.87    Sodium 11/19/2023 141    Potassium 11/19/2023 4.3    Chloride 11/19/2023 103    CO2 11/19/2023 30    Glucose, Bld 11/19/2023 89    BUN 11/19/2023 20    Creatinine, Ser 11/19/2023 0.93    Total Bilirubin 11/19/2023 0.5    Alkaline Phosphatase 11/19/2023 68    AST 11/19/2023 19    ALT 11/19/2023 17     Total Protein 11/19/2023 7.1    Albumin 11/19/2023 4.7    GFR 11/19/2023 95.35    Calcium 11/19/2023 9.8    TSH 11/19/2023 1.600    Hgb A1c MFr Bld 11/19/2023 5.6    PSA 11/19/2023 0.38   Office Visit on 08/20/2023  Component Date Value   Influenza A, POC 08/20/2023 Negative    Influenza B, POC 08/20/2023 Negative    SARS Coronavirus 2 Ag 08/20/2023 Negative   No image results found. DG Chest Port 1 View Result Date: 05/18/2024 EXAM: 1 VIEW(S) XRAY OF THE CHEST 05/18/2024 10:36:00 AM COMPARISON: None available. CLINICAL HISTORY: Chest tightness FINDINGS: LUNGS AND PLEURA: No focal pulmonary opacity. No pleural effusion. No pneumothorax. HEART AND MEDIASTINUM: No acute abnormality of the cardiac and mediastinal silhouettes. BONES AND SOFT TISSUES: No acute osseous abnormality. IMPRESSION: 1. No acute cardiopulmonary process. Electronically signed by: Aneita Kiger Salvage MD 05/18/2024 11:23 AM EST RP Workstation: [id number]   CT ABDOMEN PELVIS W CONTRAST Result Date: 05/04/2024 CLINICAL DATA:  Left lower quadrant pain several days. EXAM: CT ABDOMEN AND PELVIS WITH CONTRAST TECHNIQUE: Multidetector CT imaging of the abdomen and pelvis was performed using the standard protocol following bolus administration of intravenous contrast. RADIATION DOSE REDUCTION: This exam was performed according to the departmental dose-optimization program which includes automated exposure  control, adjustment of the mA and/or kV according to patient size and/or use of iterative reconstruction technique. CONTRAST:  OMNIPAQUE  IOHEXOL  300 MG/ML  SOLN COMPARISON:  None Available. FINDINGS: Lower chest: Heart is normal size.  Visualized lung bases are clear. Hepatobiliary: Liver, gallbladder and biliary tree are normal. Pancreas: Normal. Spleen: Normal. Adrenals/Urinary Tract: Adrenal glands are normal. Kidneys are normal in size without hydronephrosis or nephrolithiasis. There is patchy cortical low-attenuation over the  left kidney somewhat masslike in areas. These findings likely due to infection/pyelonephritis. Underlying mass is possible, but less likely. No perinephric inflammation or fluid. Ureters and bladder are normal. Stomach/Bowel: Stomach and small bowel are normal. Appendix is normal. There is diverticulosis throughout the colon without active inflammation. Vascular/Lymphatic: Normal caliber of the abdominal aorta with minimal calcified plaque over the distal abdominal aorta. Remaining vascular structures are unremarkable. No adenopathy. Reproductive: Prostate is unremarkable. Other: No free peritoneal fluid or focal inflammatory change. Musculoskeletal: No focal abnormality. IMPRESSION: 1. Patchy cortical low-attenuation over the left kidney likely due to infection/pyelonephritis. Underlying mass is possible, but less likely. Recommend follow-up renal ultrasound 6-8 weeks after appropriate treatment. 2. Colonic diverticulosis without active inflammation. 3. Aortic atherosclerosis. Aortic Atherosclerosis (ICD10-I70.0). Electronically Signed   By: Toribio Agreste M.D.   On: 05/04/2024 09:22  DG Chest Port 1 View Result Date: 05/18/2024 EXAM: 1 VIEW(S) XRAY OF THE CHEST 05/18/2024 10:36:00 AM COMPARISON: None available. CLINICAL HISTORY: Chest tightness FINDINGS: LUNGS AND PLEURA: No focal pulmonary opacity. No pleural effusion. No pneumothorax. HEART AND MEDIASTINUM: No acute abnormality of the cardiac and mediastinal silhouettes. BONES AND SOFT TISSUES: No acute osseous abnormality. IMPRESSION: 1. No acute cardiopulmonary process. Electronically signed by: Amaziah Ghosh Salvage MD 05/18/2024 11:23 AM EST RP Workstation: [id number]        ASSESSMENT & PLAN   Assessment & Plan Lumbar radiculopathy Sciatica of right side due to displacement of lumbar intervertebral disc Chronic low back pain with bilateral sciatica and degenerative lumbar disc disease with radiculopathy   He experiences chronic low back pain with  bilateral sciatica, characterized by dull, persistent pain and shooting pains during certain movements. 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 Current management involves bridging with opioids to facilitate neurosurgical intervention, with hydrocodone  providing 70-80% relief. He prefers to avoid long-term opioid use due to risks of increased tolerance and withdrawal symptoms. Neurosurgical options include injections, laminectomy, and disc replacement, with disc replacement as a last resort if injections are ineffective. Prescribed hydrocodone  60 tablets for short-term use until neurosurgical intervention. Continue gabapentin  as prescribed. Prescribed ibuprofen  800 mg, 90 tablets, to be used as needed to spare hydrocodone  use. Discussed potential neurosurgical options including injections, laminectomy, and disc replacement. Primary hypertension Essential hypertension   Blood pressure readings are slightly elevated, with a recent reading of 191/115 mmHg. Current medication, lisinopril  10 mg, is insufficient for control. Increased lisinopril  to 20 mg daily. Prescribed propranolol  to manage blood pressure and reduce heart rate and intensity during stress. Instructed to monitor blood pressure regularly. Adjustment disorder with anxiety Adjustment disorder with anxiety   He experiences significant anxiety related to personal and business life, with episodes of elevated blood pressure. Reports feeling on edge and has been prescribed Abilify  for mood stabilization. Propranolol  is added to manage physical symptoms of anxiety, such as elevated heart rate and blood pressure. Prescribed propranolol  to manage anxiety-related physical symptoms. Continue Abilify  as prescribed for mood stabilization.  ORDER ASSOCIATIONS  #   DIAGNOSIS / CONDITION  ICD-10 ENCOUNTER ORDER     ICD-10-CM   1. Primary hypertension  I10 lisinopril  (ZESTRIL ) 20 MG tablet    2. Sciatica of right  side due to displacement of lumbar intervertebral disc  M51.16 HYDROcodone -acetaminophen  (NORCO) 10-325 MG tablet    ibuprofen  (ADVIL ) 800 MG tablet    3. Adjustment disorder with anxiety  F43.22 propranolol  (INDERAL ) 10 MG tablet    4. Lumbar radiculopathy  M54.16            Orders Placed in Encounter:   Lab Orders  No laboratory test(s) ordered today   Imaging Orders  No imaging studies ordered today   Referral Orders  No referral(s) requested today   Meds ordered this encounter  Medications   HYDROcodone -acetaminophen  (NORCO) 10-325 MG tablet    Sig: Take 1 tablet by mouth every 4 (four) hours as needed for up to 10 days.    Dispense:  60 tablet    Refill:  0   lisinopril  (ZESTRIL ) 20 MG tablet    Sig: Take 1 tablet (20 mg total) by mouth daily. Replaces 10 mg dosing.    Dispense:  90 tablet    Refill:  3   ibuprofen  (ADVIL ) 800 MG tablet    Sig: Take 1 tablet (800 mg total) by mouth every 8 (eight) hours as needed.    Dispense:  90 tablet    Refill:  4   propranolol  (INDERAL ) 10 MG tablet    Sig: Take 1 tablet (10 mg total) by mouth 3 (three) times daily.    Dispense:  90 tablet    Refill:  3    No orders of the defined types were placed in this encounter.  ED Discharge Orders          Ordered    HYDROcodone -acetaminophen  (NORCO) 10-325 MG tablet  Every 4 hours PRN        05/22/24 0944    lisinopril  (ZESTRIL ) 20 MG tablet  Daily        05/22/24 0944    ibuprofen  (ADVIL ) 800 MG tablet  Every 8 hours PRN        05/22/24 0944    propranolol  (INDERAL ) 10 MG tablet  3 times daily        05/22/24 0944                Treatment plan discussed and reviewed in detail. Explained medication safety and potential side effects.  Answered all patient questions and confirmed understanding and comfort with the plan. Encouraged patient to contact our office if they have any questions or concerns.  Agreed on patient coming for a sooner office visit if symptoms worsen,  persist, or new symptoms develop. Discussed precautions in case of needing to visit the Emergency Department.    ----------------------------------------------------- Attestation:  Today's Healthcare Provider Bernardino KANDICE Cone, MD was located at office at [facility] [address], [place] [address].  The patient was located at home. All video encounter participant identities and locations confirmed visually and verbally.Today's Telemedicine visit was conducted via synchronous Video after consent for telemedicine was obtained:  Video connection was lost when more than 50% of the duration of the visit was complete, at which time the remainder of the visit was completed via audio only.     This document was transcribed and resynthesized, in part, by artificial intelligence ([employer])  using HIPAA-compliant recording of the clinical interaction;   We have discussed the our use of AI  scribe software for clinical note transcription with the patient, who has given verbal consent to proceed.

## 2024-05-22 NOTE — Assessment & Plan Note (Signed)
 Chronic low back pain with bilateral sciatica and degenerative lumbar disc disease with radiculopathy   He experiences chronic low back pain with bilateral sciatica, characterized by dull, persistent pain and shooting pains during certain movements. 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 Current management involves bridging with opioids to facilitate neurosurgical intervention, with hydrocodone  providing 70-80% relief. He prefers to avoid long-term opioid use due to risks of increased tolerance and withdrawal symptoms. Neurosurgical options include injections, laminectomy, and disc replacement, with disc replacement as a last resort if injections are ineffective. Prescribed hydrocodone  60 tablets for short-term use until neurosurgical intervention. Continue gabapentin  as prescribed. Prescribed ibuprofen  800 mg, 90 tablets, to be used as needed to spare hydrocodone  use. Discussed potential neurosurgical options including injections, laminectomy, and disc replacement.

## 2024-05-22 NOTE — Telephone Encounter (Signed)
 Pt is scheduled for a virtual at 9:20 am this morning (05/22/24).

## 2024-05-28 NOTE — Progress Notes (Unsigned)
 Chief Complaint: No chief complaint on file.   History of Present Illness:  Joseph Williamson is a 53 y.o. male who is seen in consultation from Jesus Bernardino MATSU, MD for evaluation of ***.   Past Medical History:  Past Medical History:  Diagnosis Date   Arthritis    Back pain    Cluster headaches    COVID-19 07/11/2021   History of chickenpox    Hypertension    Perforation of right tympanic membrane 01/07/2021   Primary hypertension 09/11/2019   Recurrent acute serous otitis media of right ear 07/01/2021    Past Surgical History:  Past Surgical History:  Procedure Laterality Date   none     TRIGGER FINGER RELEASE Right 04/13/2023      Allergies:  No Known Allergies  Family History:  Family History  Problem Relation Age of Onset   Arthritis Mother    Cancer Mother        Pancreactic   Colon polyps Mother    Pancreatic cancer Mother    Gout Father    Kidney Stones Father    Colon polyps Father    Stroke Father        2/2 COVID   Colon cancer Neg Hx    Diabetes Neg Hx    Kidney disease Neg Hx    Liver disease Neg Hx     Social History:  Social History   Tobacco Use   Smoking status: Former    Types: Cigarettes   Smokeless tobacco: Never   Tobacco comments:    Quit January 2025  Vaping Use   Vaping status: Never Used  Substance Use Topics   Alcohol use: Not Currently    Alcohol/week: 14.0 standard drinks of alcohol    Types: 14 Cans of beer per week    Comment: quit some time ago   Drug use: No    Review of symptoms:  Constitutional:  Negative for unexplained weight loss, night sweats, fever, chills ENT:  Negative for nose bleeds, sinus pain, painful swallowing CV:  Negative for chest pain, shortness of breath, exercise intolerance, palpitations, loss of consciousness Resp:  Negative for cough, wheezing, shortness of breath GI:  Negative for nausea, vomiting, diarrhea, bloody stools GU:  Positives noted in HPI; otherwise negative for gross  hematuria, dysuria, urinary incontinence Neuro:  Negative for seizures, poor balance, limb weakness, slurred speech Psych:  Negative for lack of energy, depression, anxiety Endocrine:  Negative for polydipsia, polyuria, symptoms of hypoglycemia (dizziness, hunger, sweating) Hematologic:  Negative for anemia, purpura, petechia, prolonged or excessive bleeding, use of anticoagulants  Allergic:  Negative for difficulty breathing or choking as a result of exposure to anything; no shellfish allergy; no allergic response (rash/itch) to materials, foods  Physical exam: There were no vitals taken for this visit. GENERAL APPEARANCE:  Well appearing, well developed, well nourished, NAD HEENT: Atraumatic, Normocephalic. NECK: Normal appearance LUNGS: Normal inspiratory and expiratory excursion HEART: Regular Rate ABDOMEN: ***. GU: Phallus normal, no lesions. Scrotal skin normal. Testicles/epididymal structures normal. Meatus normal. Normal anal sphincter tone, prostate ***mL, symmetric, non nodular, non tender. EXTREMITIES: Moves all extremities well.  Without clubbing, cyanosis, or edema. NEUROLOGIC:  Alert and oriented x 3, normal gait, CN II-XII grossly intact.  MENTAL STATUS:  Appropriate. SKIN:  Warm, dry and intact.    Results:  I have reviewed referring/prior physicians notes  I have reviewed urinalysis  I have reviewed PSA results--0.38 earlier this year  I have reviewed prior imaging--CT  A/P from 11.6.2025--images reviewed  I have reviewed urine culture results  Assessment: ***   Plan: ***

## 2024-05-29 ENCOUNTER — Telehealth: Payer: Self-pay | Admitting: *Deleted

## 2024-05-29 ENCOUNTER — Ambulatory Visit: Admitting: Urology

## 2024-05-29 ENCOUNTER — Other Ambulatory Visit: Payer: Self-pay

## 2024-05-29 VITALS — BP 137/97 | HR 63 | Ht 73.0 in | Wt 230.0 lb

## 2024-05-29 DIAGNOSIS — R93422 Abnormal radiologic findings on diagnostic imaging of left kidney: Secondary | ICD-10-CM

## 2024-05-29 DIAGNOSIS — R93429 Abnormal radiologic findings on diagnostic imaging of unspecified kidney: Secondary | ICD-10-CM | POA: Diagnosis not present

## 2024-05-29 LAB — URINALYSIS, ROUTINE W REFLEX MICROSCOPIC
Bilirubin, UA: NEGATIVE
Glucose, UA: NEGATIVE
Ketones, UA: NEGATIVE
Leukocytes,UA: NEGATIVE
Nitrite, UA: NEGATIVE
Protein,UA: NEGATIVE
RBC, UA: NEGATIVE
Specific Gravity, UA: 1.02 (ref 1.005–1.030)
Urobilinogen, Ur: 0.2 mg/dL (ref 0.2–1.0)
pH, UA: 6.5 (ref 5.0–7.5)

## 2024-05-29 LAB — BLADDER SCAN AMB NON-IMAGING: Scan Result: 25

## 2024-05-29 NOTE — Progress Notes (Unsigned)
 Complex Care Management Care Guide Note  05/29/2024 Name: Joseph Williamson MRN: [id number] DOB: April 09, 1971  Joseph Williamson is a 53 y.o. year old male who is a primary care patient of Jesus Bernardino MATSU, MD and is actively engaged with the care management team. I reached out to Prentice LOISE Igo by phone today to assist with re-scheduling  with the BSW.  Follow up plan: Unsuccessful telephone outreach attempt made. A HIPAA compliant phone message was left for the patient providing contact information and requesting a return call.  Thedford Kretchmer, CMA, [place]  [facility], [facility] Guide Direct Dial: [phone]  Fax: [phone] Website: [email or web address]

## 2024-05-30 ENCOUNTER — Ambulatory Visit: Admitting: Internal Medicine

## 2024-05-30 VITALS — BP 132/84 | HR 67 | Temp 98.3°F | Ht 73.0 in | Wt 226.4 lb

## 2024-05-30 DIAGNOSIS — M5116 Intervertebral disc disorders with radiculopathy, lumbar region: Secondary | ICD-10-CM | POA: Diagnosis not present

## 2024-05-30 DIAGNOSIS — F4322 Adjustment disorder with anxiety: Secondary | ICD-10-CM | POA: Diagnosis not present

## 2024-05-30 DIAGNOSIS — E782 Mixed hyperlipidemia: Secondary | ICD-10-CM

## 2024-05-30 MED ORDER — HYDROCODONE-ACETAMINOPHEN 10-325 MG PO TABS: 1.0000 | ORAL_TABLET | ORAL | 0 refills | Status: AC | PRN

## 2024-05-30 MED ORDER — PROPRANOLOL HCL ER 60 MG PO CP24: 60.0000 mg | ORAL_CAPSULE | Freq: Every day | ORAL | 4 refills | Status: AC

## 2024-05-30 MED ORDER — ROSUVASTATIN CALCIUM 20 MG PO TABS: 20.0000 mg | ORAL_TABLET | Freq: Every day | ORAL | 4 refills | Status: AC

## 2024-05-30 NOTE — Progress Notes (Unsigned)
 ==============================  [facility]: [phone]   -- Medical Office Visit --  Patient: Joseph Williamson      Age: 53 y.o.       Sex:  male  Date:   05/30/2024 Today's Healthcare Provider: Bernardino KANDICE Cone, MD  ==============================   Chief Complaint: Hypertension (123/95 has been at home. Urology appt yesterday 135/98) Discussed the use of AI scribe software for clinical note transcription with the patient, who gave verbal consent to proceed.  History of Present Illness   Wt Readings from Last 50 Encounters:  05/30/24 226 lb 6.4 oz (102.7 kg)  05/29/24 230 lb (104.3 kg)  05/17/24 225 lb (102.1 kg)  05/11/24 232 lb (105.2 kg)  05/01/24 233 lb (105.7 kg)  04/27/24 239 lb 9.6 oz (108.7 kg)  11/19/23 221 lb (100.2 kg)  08/20/23 226 lb 6.4 oz (102.7 kg)  11/18/22 208 lb 3.2 oz (94.4 kg)  11/11/22 212 lb 2 oz (96.2 kg)  10/29/22 207 lb 6.4 oz (94.1 kg)  09/17/21 281 lb 12.8 oz (127.8 kg)  06/03/21 281 lb 9.6 oz (127.7 kg)  03/20/21 279 lb 12.8 oz (126.9 kg)  01/29/21 275 lb (124.7 kg)  12/22/20 280 lb (127 kg)  06/18/20 277 lb (125.6 kg)  10/11/19 281 lb (127.5 kg)  09/11/19 285 lb (129.3 kg)  08/18/19 286 lb (129.7 kg)  05/27/19 275 lb (124.7 kg)  05/22/19 275 lb (124.7 kg)  03/29/14 258 lb (117 kg)  03/26/14 258 lb 3.2 oz (117.1 kg)  05/07/12 250 lb (113.4 kg)   BMI Readings from Last 50 Encounters:  05/30/24 29.87 kg/m  05/29/24 30.34 kg/m  05/17/24 29.69 kg/m  05/11/24 30.61 kg/m  05/01/24 30.74 kg/m  04/27/24 31.61 kg/m  11/19/23 29.16 kg/m  08/20/23 29.87 kg/m  11/18/22 27.47 kg/m  11/11/22 27.99 kg/m  10/29/22 27.36 kg/m  09/17/21 37.18 kg/m  06/03/21 35.20 kg/m  03/20/21 34.97 kg/m  01/29/21 36.28 kg/m  12/22/20 35.00 kg/m  06/18/20 36.55 kg/m  10/11/19 37.07 kg/m  09/11/19 39.20 kg/m  08/18/19 37.73 kg/m  05/27/19 36.28 kg/m  05/22/19 36.28 kg/m  03/29/14 34.04 kg/m   03/26/14 34.07 kg/m  05/07/12 32.98 kg/m       {:1 Problem List as of 05/30/2024 Reviewed: 05/30/2024 11:47 AM by Thedora Maire GRADE, CMA    Acquired trigger finger   Bilateral hearing loss   BRBPR (bright red blood per rectum)   Last Assessment & Plan 05/11/2024 Office Visit Written 05/11/2024 11:24 AM by Cone Bernardino KANDICE, MD  Bright red blood per rectum (BRBPR)   BRBPR is likely secondary to hemorrhoids, possibly exacerbated by chronic low back pain and straining. A previous colonoscopy indicated the need for follow-up screening. Place an urgent referral for a colonoscopy to evaluate BRBPR and rule out other causes.      Cellulitis of abdominal wall   Last Assessment & Plan 04/27/2024 Office Visit Written 04/27/2024  2:53 PM by Cone Bernardino KANDICE, MD  minal wall, possible tick bite   A recurrent lesion on the abdominal wall, possibly due to a tick bite, appears infected, suggesting cellulitis. Discussed the possibility of Lyme disease and other tick-borne infections. Prescribe doxycycline  for cellulitis and provide refills for future tick bites.      Chronic bilateral low back pain with bilateral sciatica   Last Assessment & Plan 04/27/2024 Office Visit Written 04/27/2024  2:53 PM by Cone Bernardino KANDICE, MD  Degenerative lumbar disc disease with severe neuroforaminal  narrowing and right-sided lumbago with sciatica   He experiences chronic back pain with severe neuroforaminal narrowing and right-sided lumbago with sciatica, accompanied by pain in the left buttock and tingling and numbness down the right leg, indicating nerve root compression. A previous MRI from 2020 showed severe disc degeneration. He is interested in non-fusion options. Order an MRI for intractable lumbar pain and sciatica without contrast. Increase gabapentin  to 600 mg tid. Refer to a spine surgeon for evaluation. Consider a spinal stimulator if the MRI indicates candidacy. Avoid chiropractic interventions.      Chronic  swimmer's ear of right side   Dermatitis   Dysfunction of both eustachian tubes   Last Assessment & Plan 11/19/2023 Office Visit Written 11/19/2023 10:58 PM by Jesus Bernardino MATSU, MD  Chronic eustachian tube dysfunction is exacerbated by pollen. Recommend daily saline sprays to manage symptoms.      FH: prostate cancer   Former smoker   Last Assessment & Plan 11/19/2023 Office Visit Written 11/19/2023 10:58 PM by Jesus Bernardino MATSU, MD  He is a former smoker who has quit smoking. Occasionally uses nicotine  lozenges to manage stress. Discussed the use of Zen product as an alternative, with no evidence of harm noted.      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 No current intervention is required.      Hyperlipidemia   Last Assessment & Plan 05/11/2024 Office Visit Written 05/11/2024 11:24 AM by Jesus Bernardino MATSU, MD  Shared decision-making done; patient understood rationale and agreed to [facility] fasting lipid profile       Hypoglycemia   Left tennis elbow   Last Assessment & Plan 11/19/2023 Office Visit Written 11/19/2023 10:58 PM by Jesus Bernardino MATSU, MD  Self-diagnosed tennis elbow is effectively managed with a strap. The condition is not currently bothersome and does not interfere with daily activities.      Lumbar radiculopathy   Last Assessment & Plan 05/22/2024 Telemedicine Written 05/22/2024  3:19 PM by Jesus Bernardino MATSU, MD  Chronic low back pain with bilateral sciatica and degenerative lumbar disc disease with radiculopathy   He experiences chronic low back pain with bilateral sciatica, characterized by dull, persistent pain and shooting pains during certain movements. The pain is exacerbated by a previous disc injury and worsened by construction  work. Current management involves bridging with opioids to facilitate neurosurgical intervention, with hydrocodone  providing 70-80% relief. He prefers to avoid long-term opioid use due to risks of increased tolerance and withdrawal symptoms. Neurosurgical options include injections, laminectomy, and disc replacement, with disc replacement as a last resort if injections are ineffective. Prescribed hydrocodone  60 tablets for short-term use until neurosurgical intervention. Continue gabapentin  as prescribed. Prescribed ibuprofen  800 mg, 90 tablets, to be used as needed to spare hydrocodone  use. Discussed potential neurosurgical options including injections, laminectomy, and disc replacement.      Non-seasonal allergic rhinitis   Overweight (BMI 25.0-29.9)   Last Assessment & Plan 11/19/2023 Office Visit Written 11/19/2023 10:58 PM by Jesus Bernardino MATSU, MD  He previously lost 100 pounds but has regained 20 pounds. He engages in regular physical activity and follows a healthy diet. Emphasized the importance of avoiding trans fats to prevent cardiovascular conditions. Encouraged continued weight loss efforts to reduce abdominal adiposity. Discussed the option of  a CT scan of the heart to assess for heart disease, which costs $100 and is not covered by insurance. If heart disease is found, management would include more aggressive cholesterol medication and aspirin therapy. Order a CT scan of the heart to assess for heart disease. Perform blood work to check cholesterol, thyroid , and A1c levels. Encourage avoidance of trans fats and promote healthy eating habits.      Prediabetes   Last Assessment & Plan 05/11/2024 Office Visit Written 05/11/2024 11:24 AM by Jesus Bernardino MATSU, MD  Recent evaluation of diabetes status emphasizes the importance of monitoring blood glucose levels. Order an A1c test to assess current diabetes status.      Pyelonephritis   Last Assessment & Plan 05/11/2024 Office Visit Edited  05/11/2024  5:18 PM by Jesus Bernardino MATSU, MD  Acute pyelonephritis, left kidney, resolving and suspected renal mass, left kidney (under evaluation)   Acute pyelonephritis of the left kidney is resolving with significant improvement in symptoms. A suspected renal mass on imaging presents a differential of infection versus mass. A previous CT scan showed a low attenuation area, likely due to infection, but a mass cannot be ruled out. An MRI is preferred for further evaluation due to superior imaging capabilities due to renal mass and smoking history ultrasound cannot bosniak classify.  Patient had no urinary tract symptoms prior to the sudden pain and is male so odds of denovo urinary tract infection (UTI) without mass are lower.  Order an MRI of the abdomen to evaluate the left kidney for mass features blood/urine work to assess kidney function and ensure resolution of the UTI.      Renal mass   Last Assessment & Plan 05/11/2024 Office Visit Written 05/11/2024  5:18 PM by Jesus Bernardino MATSU, MD  Acute pyelonephritis, left kidney, resolving and suspected renal mass, left kidney (under evaluation)   Acute pyelonephritis of the left kidney is resolving with significant improvement in symptoms. A suspected renal mass on imaging presents a differential of infection versus mass. A previous CT scan showed a low attenuation area, likely due to infection, but a mass cannot be ruled out. An MRI is preferred for further evaluation due to superior imaging capabilities due to renal mass and smoking history ultrasound cannot bosniak classify.  Patient had no urinary tract symptoms prior to the sudden pain and is male so odds of denovo urinary tract infection (UTI) without mass are lower.  Order an MRI of the abdomen to evaluate the left kidney for mass features blood/urine work to assess kidney function and ensure resolution of the UTI.      Rhinitis medicamentosa   Last Assessment & Plan 11/18/2022 Office Visit Written  11/18/2022  1:53 PM by Jesus Bernardino MATSU, MD  Encouraged patient to to try to slowly shift to Flonase  + nightly simply saline       Skin lesion   Suspected sleep apnea   Last Assessment & Plan 05/11/2024 Office Visit Written 05/11/2024 11:24 AM by Jesus Bernardino MATSU, MD  Suspected sleep apnea (untreated)   Suspected sleep apnea with a previous referral for a sleep study remains untreated as no CPAP machine has been obtained yet due to other health priorities. Encourage follow-up with a sleep specialist to complete the sleep study and obtain a CPAP machine.     :1} {   Updated Problem List Entries: No problems updated.   (optional):1 } Background Reviewed: Problem List: has Former smoker; Rhinitis medicamentosa; Dysfunction of both eustachian tubes; Bilateral  hearing loss; Generalized osteoarthritis; Lumbar radiculopathy; Chronic swimmer's ear of right side; Non-seasonal allergic rhinitis; Overweight (BMI 25.0-29.9); Acquired trigger finger; Prediabetes; Hyperlipidemia; History of perforation of tympanic membrane; FH: prostate cancer; Skin lesion; Dermatitis; Left tennis elbow; Suspected sleep apnea; Chronic bilateral low back pain with bilateral sciatica; BRBPR (bright red blood per rectum); Cellulitis of abdominal wall; Renal mass; Pyelonephritis; and Hypoglycemia on their problem list. Past Medical History:  has a past medical history of Arthritis, Back pain, Cluster headaches, COVID-19 (07/11/2021), History of chickenpox, Hypertension, Perforation of right tympanic membrane (01/07/2021), Primary hypertension (09/11/2019), and Recurrent acute serous otitis media of right ear (07/01/2021). Past Surgical History:   has a past surgical history that includes none and TRIGGER FINGER RELEASE Right 04/13/2023. Social History:   reports that he has quit smoking. His smoking use included cigarettes. He has never used smokeless tobacco. He reports that he does not currently use alcohol after a past usage of  about 14.0 standard drinks of alcohol per week. He reports that he does not use drugs. Family History:  family history includes Arthritis in his mother; Cancer in his mother; Colon polyps in his father and mother; Gout in his father; Kidney Stones in his father; Pancreatic cancer in his mother; Stroke in his father. Allergies:  has no known allergies.   Medication Reconciliation: Current Outpatient Medications on File Prior to Visit  Medication Sig   ALPRAZolam  (XANAX ) 0.5 MG tablet Take 1 tablet (0.5 mg total) by mouth 2 (two) times daily as needed for anxiety (do not take within 4 hours of opioid). (Patient not taking: Reported on 05/29/2024)   ARIPiprazole  (ABILIFY ) 5 MG tablet Take 1 tablet (5 mg total) by mouth daily. (Patient not taking: Reported on 05/29/2024)   FLUoxetine  (PROZAC ) 20 MG capsule Take 1 capsule (20 mg total) by mouth daily. (Patient not taking: Reported on 05/29/2024)   gabapentin  (NEURONTIN ) 300 MG capsule Take 2 capsules (600 mg total) by mouth 3 (three) times daily.   HYDROcodone -acetaminophen  (NORCO) 10-325 MG tablet Take 1 tablet by mouth every 4 (four) hours as needed for up to 10 days.   ibuprofen  (ADVIL ) 800 MG tablet Take 1 tablet (800 mg total) by mouth every 8 (eight) hours as needed.   lisinopril  (ZESTRIL ) 20 MG tablet Take 1 tablet (20 mg total) by mouth daily. Replaces 10 mg dosing.   propranolol  (INDERAL ) 10 MG tablet Take 1 tablet (10 mg total) by mouth 3 (three) times daily.   No current facility-administered medications on file prior to visit.  There are no discontinued medications.   Physical Exam:    05/30/2024   11:47 AM 05/29/2024    9:20 AM 05/18/2024   12:00 PM  Vitals with BMI  Height 6' 1 6' 1   Weight 226 lbs 6 oz 230 lbs   BMI 29.88 30.35   Systolic 132 137 847  Diastolic 84 97 100  Pulse 67 63 62  Vital signs reviewed.  Nursing notes reviewed. Weight trend reviewed. Physical Activity: Sufficiently Active (05/30/2024)   Exercise Vital  Sign    Days of Exercise per Week: 5 days    Minutes of Exercise per Session: 120 min   General Appearance:  No acute distress appreciable.   Well-groomed, healthy-appearing male.  Well proportioned with no abnormal fat distribution.  Good muscle tone. Pulmonary:  Normal work of breathing at rest, no respiratory distress apparent. SpO2: 98 %  Musculoskeletal: All extremities are intact.  Neurological:  Awake, alert, oriented, and engaged.  No obvious focal neurological deficits or cognitive impairments.  Sensorium seems unclouded.   Speech is clear and coherent with logical content. Psychiatric:  Appropriate mood, pleasant and cooperative demeanor, thoughtful and engaged during the exam   Verbalized to patient: Physical Exam    Results:   Verbalized to patient: Results      11/19/2023    7:57 AM 08/20/2023   10:41 AM 11/18/2022    1:36 PM 10/29/2022    2:55 PM  PHQ 2/9 Scores  PHQ - 2 Score 0 0 0 0  PHQ- 9 Score 0   2       Data saved with a previous flowsheet row definition    Office Visit on 05/29/2024  Component Date Value Ref Range Status   Specific Gravity, UA 05/29/2024 1.020  1.005 - 1.030 Final   pH, UA 05/29/2024 6.5  5.0 - 7.5 Final   Color, UA 05/29/2024 Yellow  Yellow Final   Appearance Ur 05/29/2024 Clear  Clear Final   Leukocytes,UA 05/29/2024 Negative  Negative Final   Protein,UA 05/29/2024 Negative  Negative/Trace Final   Glucose, UA 05/29/2024 Negative  Negative Final   Ketones, UA 05/29/2024 Negative  Negative Final   RBC, UA 05/29/2024 Negative  Negative Final   Bilirubin, UA 05/29/2024 Negative  Negative Final   Urobilinogen, Ur 05/29/2024 0.2  0.2 - 1.0 mg/dL Final   Nitrite, UA 87/98/7974 Negative  Negative Final   Microscopic Examination 05/29/2024 Comment   Final   Scan Result 05/29/2024 25   Final  Admission on 05/18/2024, Discharged on 05/18/2024  Component Date Value Ref Range Status   Sodium 05/18/2024 142  135 - 145 mmol/L Final    Potassium 05/18/2024 4.4  3.5 - 5.1 mmol/L Final   Chloride 05/18/2024 103  98 - 111 mmol/L Final   CO2 05/18/2024 29  22 - 32 mmol/L Final   Glucose, Bld 05/18/2024 99  70 - 99 mg/dL Final   BUN 88/79/7974 21 (H)  6 - 20 mg/dL Final   Creatinine, Ser 05/18/2024 1.00  0.61 - 1.24 mg/dL Final   Calcium 88/79/7974 10.8 (H)  8.9 - 10.3 mg/dL Final   GFR, Estimated 05/18/2024 >60  >60 mL/min Final   Anion gap 05/18/2024 10  5 - 15 Final   WBC 05/18/2024 9.6  4.0 - 10.5 K/uL Final   RBC 05/18/2024 4.65  4.22 - 5.81 MIL/uL Final   Hemoglobin 05/18/2024 14.2  13.0 - 17.0 g/dL Final   HCT 88/79/7974 40.3  39.0 - 52.0 % Final   MCV 05/18/2024 86.7  80.0 - 100.0 fL Final   MCH 05/18/2024 30.5  26.0 - 34.0 pg Final   MCHC 05/18/2024 35.2  30.0 - 36.0 g/dL Final   RDW 88/79/7974 12.3  11.5 - 15.5 % Final   Platelets 05/18/2024 308  150 - 400 K/uL Final   nRBC 05/18/2024 0.0  0.0 - 0.2 % Final   Troponin T High Sensitivity 05/18/2024 <15  0 - 19 ng/L Final  Office Visit on 05/11/2024  Component Date Value Ref Range Status   Sodium 05/11/2024 143  135 - 145 mEq/L Final   Potassium 05/11/2024 4.6  3.5 - 5.1 mEq/L Final   Chloride 05/11/2024 105  96 - 112 mEq/L Final   CO2 05/11/2024 23  19 - 32 mEq/L Final   Glucose, Bld 05/11/2024 48 (LL)  70 - 99 mg/dL Final   BUN 88/86/7974 21  6 - 23 mg/dL Final   Creatinine, Ser 05/11/2024 1.18  0.40 - 1.50 mg/dL Final   Total Bilirubin 05/11/2024 0.3  0.2 - 1.2 mg/dL Final   Alkaline Phosphatase 05/11/2024 74  39 - 117 U/L Final   AST 05/11/2024 24  0 - 37 U/L Final   ALT 05/11/2024 30  0 - 53 U/L Final   Total Protein 05/11/2024 7.5  6.0 - 8.3 g/dL Final   Albumin 88/86/7974 4.4  3.5 - 5.2 g/dL Final   GFR 88/86/7974 71.41  >60.00 mL/min Final   Calcium 05/11/2024 10.3  8.4 - 10.5 mg/dL Final   Color, Urine 88/86/7974 DARK YELLOW  YELLOW Final   APPearance 05/11/2024 CLEAR  CLEAR Final   Specific Gravity, Urine 05/11/2024 1.028  1.001 - 1.035 Final    pH 05/11/2024 6.5  5.0 - 8.0 Final   Glucose, UA 05/11/2024 NEGATIVE  NEGATIVE Final   Bilirubin Urine 05/11/2024 NEGATIVE  NEGATIVE Final   Ketones, ur 05/11/2024 NEGATIVE  NEGATIVE Final   Hgb urine dipstick 05/11/2024 NEGATIVE  NEGATIVE Final   Protein, ur 05/11/2024 NEGATIVE  NEGATIVE Final   Nitrites, Initial 05/11/2024 NEGATIVE  NEGATIVE Final   Leukocyte Esterase 05/11/2024 NEGATIVE  NEGATIVE Final   Note 05/11/2024    Final   Hgb A1c MFr Bld 05/11/2024 5.6  4.6 - 6.5 % Final   Cholesterol 05/11/2024 172  0 - 200 mg/dL Final   Triglycerides 88/86/7974 112.0  0.0 - 149.0 mg/dL Final   HDL 88/86/7974 31.30 (L)  >60.99 mg/dL Final   VLDL 88/86/7974 22.4  0.0 - 40.0 mg/dL Final   LDL Cholesterol 05/11/2024 118 (H)  0 - 99 mg/dL Final   Total CHOL/HDL Ratio 05/11/2024 5   Final   NonHDL 05/11/2024 140.23   Final   WBC 05/11/2024 8.2  3.4 - 10.8 x10E3/uL Final   RBC 05/11/2024 4.24  4.14 - 5.80 x10E6/uL Final   Hemoglobin 05/11/2024 12.9 (L)  13.0 - 17.7 g/dL Final   Hematocrit 88/86/7974 39.1  37.5 - 51.0 % Final   MCV 05/11/2024 92  79 - 97 fL Final   MCH 05/11/2024 30.4  26.6 - 33.0 pg Final   MCHC 05/11/2024 33.0  31.5 - 35.7 g/dL Final   RDW 88/86/7974 12.2  11.6 - 15.4 % Final   Platelets 05/11/2024 283  150 - 450 x10E3/uL Final   Neutrophils 05/11/2024 53  Not Estab. % Final   Lymphs 05/11/2024 34  Not Estab. % Final   Monocytes 05/11/2024 7  Not Estab. % Final   Eos 05/11/2024 4  Not Estab. % Final   Basos 05/11/2024 1  Not Estab. % Final   Neutrophils Absolute 05/11/2024 4.4  1.4 - 7.0 x10E3/uL Final   Lymphocytes Absolute 05/11/2024 2.8  0.7 - 3.1 x10E3/uL Final   Monocytes Absolute 05/11/2024 0.6  0.1 - 0.9 x10E3/uL Final   EOS (ABSOLUTE) 05/11/2024 0.3  0.0 - 0.4 x10E3/uL Final   Basophils Absolute 05/11/2024 0.1  0.0 - 0.2 x10E3/uL Final   Immature Granulocytes 05/11/2024 1  Not Estab. % Final   Immature Grans (Abs) 05/11/2024 0.1  0.0 - 0.1 x10E3/uL Final    Reflexve Urine Culture 05/11/2024    Final  Admission on 05/04/2024, Discharged on 05/04/2024  Component Date Value Ref Range Status   Lipase 05/04/2024 17  11 - 51 U/L Final   Sodium 05/04/2024 140  135 - 145 mmol/L Final   Potassium 05/04/2024 4.7  3.5 - 5.1 mmol/L Final   Chloride 05/04/2024 102  98 - 111 mmol/L Final  CO2 05/04/2024 30  22 - 32 mmol/L Final   Glucose, Bld 05/04/2024 110 (H)  70 - 99 mg/dL Final   BUN 88/93/7974 22 (H)  6 - 20 mg/dL Final   Creatinine, Ser 05/04/2024 1.17  0.61 - 1.24 mg/dL Final   Calcium 88/93/7974 10.7 (H)  8.9 - 10.3 mg/dL Final   Total Protein 88/93/7974 7.5  6.5 - 8.1 g/dL Final   Albumin 88/93/7974 4.7  3.5 - 5.0 g/dL Final   AST 88/93/7974 25  15 - 41 U/L Final   ALT 05/04/2024 25  0 - 44 U/L Final   Alkaline Phosphatase 05/04/2024 86  38 - 126 U/L Final   Total Bilirubin 05/04/2024 0.4  0.0 - 1.2 mg/dL Final   GFR, Estimated 05/04/2024 >60  >60 mL/min Final   Anion gap 05/04/2024 8  5 - 15 Final   WBC 05/04/2024 12.3 (H)  4.0 - 10.5 K/uL Final   RBC 05/04/2024 4.66  4.22 - 5.81 MIL/uL Final   Hemoglobin 05/04/2024 14.2  13.0 - 17.0 g/dL Final   HCT 88/93/7974 40.9  39.0 - 52.0 % Final   MCV 05/04/2024 87.8  80.0 - 100.0 fL Final   MCH 05/04/2024 30.5  26.0 - 34.0 pg Final   MCHC 05/04/2024 34.7  30.0 - 36.0 g/dL Final   RDW 88/93/7974 12.2  11.5 - 15.5 % Final   Platelets 05/04/2024 191  150 - 400 K/uL Final   nRBC 05/04/2024 0.0  0.0 - 0.2 % Final   Color, Urine 05/04/2024 YELLOW  YELLOW Final   APPearance 05/04/2024 CLEAR  CLEAR Final   Specific Gravity, Urine 05/04/2024 1.025  1.005 - 1.030 Final   pH 05/04/2024 6.0  5.0 - 8.0 Final   Glucose, UA 05/04/2024 NEGATIVE  NEGATIVE mg/dL Final   Hgb urine dipstick 05/04/2024 NEGATIVE  NEGATIVE Final   Bilirubin Urine 05/04/2024 NEGATIVE  NEGATIVE Final   Ketones, ur 05/04/2024 NEGATIVE  NEGATIVE mg/dL Final   Protein, ur 88/93/7974 NEGATIVE  NEGATIVE mg/dL Final   Nitrite 88/93/7974  NEGATIVE  NEGATIVE Final   Leukocytes,Ua 05/04/2024 NEGATIVE  NEGATIVE Final  Appointment on 05/03/2024  Component Date Value Ref Range Status   Sodium 05/03/2024 141  135 - 145 mEq/L Final   Potassium 05/03/2024 4.4  3.5 - 5.1 mEq/L Final   Chloride 05/03/2024 103  96 - 112 mEq/L Final   CO2 05/03/2024 30  19 - 32 mEq/L Final   Glucose, Bld 05/03/2024 87  70 - 99 mg/dL Final   BUN 88/94/7974 24 (H)  6 - 23 mg/dL Final   Creatinine, Ser 05/03/2024 1.25  0.40 - 1.50 mg/dL Final   GFR 88/94/7974 66.65  >60.00 mL/min Final   Calcium 05/03/2024 9.6  8.4 - 10.5 mg/dL Final   WBC 88/94/7974 8.1  4.0 - 10.5 K/uL Final   RBC 05/03/2024 4.66  4.22 - 5.81 Mil/uL Final   Hemoglobin 05/03/2024 14.2  13.0 - 17.0 g/dL Final   HCT 88/94/7974 41.4  39.0 - 52.0 % Final   MCV 05/03/2024 89.0  78.0 - 100.0 fl Final   MCHC 05/03/2024 34.2  30.0 - 36.0 g/dL Final   RDW 88/94/7974 13.0  11.5 - 15.5 % Final   Platelets 05/03/2024 187.0  150.0 - 400.0 K/uL Final   Neutrophils Relative % 05/03/2024 55.9  43.0 - 77.0 % Final   Lymphocytes Relative 05/03/2024 33.8  12.0 - 46.0 % Final   Monocytes Relative 05/03/2024 7.3  3.0 - 12.0 % Final  Eosinophils Relative 05/03/2024 2.3  0.0 - 5.0 % Final   Basophils Relative 05/03/2024 0.7  0.0 - 3.0 % Final   Neutro Abs 05/03/2024 4.6  1.4 - 7.7 K/uL Final   Lymphs Abs 05/03/2024 2.8  0.7 - 4.0 K/uL Final   Monocytes Absolute 05/03/2024 0.6  0.1 - 1.0 K/uL Final   Eosinophils Absolute 05/03/2024 0.2  0.0 - 0.7 K/uL Final   Basophils Absolute 05/03/2024 0.1  0.0 - 0.1 K/uL Final  Office Visit on 05/01/2024  Component Date Value Ref Range Status   Color, Urine 05/01/2024 YELLOW  Yellow;Lt. Yellow;Straw;Dark Yellow;Amber;Green;Red;Brown Final   APPearance 05/01/2024 CLEAR  Clear;Turbid;Slightly Cloudy;Cloudy Final   Specific Gravity, Urine 05/01/2024 1.020  1.000 - 1.030 Final   pH 05/01/2024 6.5  5.0 - 8.0 Final   Total Protein, Urine 05/01/2024 NEGATIVE  Negative  Final   Urine Glucose 05/01/2024 NEGATIVE  Negative Final   Ketones, ur 05/01/2024 NEGATIVE  Negative Final   Bilirubin Urine 05/01/2024 NEGATIVE  Negative Final   Hgb urine dipstick 05/01/2024 NEGATIVE  Negative Final   Urobilinogen, UA 05/01/2024 0.2  0.0 - 1.0 Final   Leukocytes,Ua 05/01/2024 NEGATIVE  Negative Final   Nitrite 05/01/2024 NEGATIVE  Negative Final   WBC, UA 05/01/2024 0-2/hpf  0-2/hpf Final   RBC / HPF 05/01/2024 none seen  0-2/hpf Final   Squamous Epithelial / HPF 05/01/2024 Rare(0-4/hpf)  Rare(0-4/hpf) Final   MICRO NUMBER: 05/01/2024 [id number]   Final   SPECIMEN QUALITY: 05/01/2024 Adequate   Final   Sample Source 05/01/2024 URINE   Final   STATUS: 05/01/2024 FINAL   Final   Result: 05/01/2024 No Growth   Final   WBC 05/01/2024 8.1  4.0 - 10.5 K/uL Final   RBC 05/01/2024 4.44  4.22 - 5.81 Mil/uL Final   Hemoglobin 05/01/2024 13.6  13.0 - 17.0 g/dL Final   HCT 88/96/7974 39.6  39.0 - 52.0 % Final   MCV 05/01/2024 89.0  78.0 - 100.0 fl Final   MCHC 05/01/2024 34.4  30.0 - 36.0 g/dL Final   RDW 88/96/7974 12.9  11.5 - 15.5 % Final   Platelets 05/01/2024 187.0  150.0 - 400.0 K/uL Final   Neutrophils Relative % 05/01/2024 54.7  43.0 - 77.0 % Final   Lymphocytes Relative 05/01/2024 34.9  12.0 - 46.0 % Final   Monocytes Relative 05/01/2024 7.0  3.0 - 12.0 % Final   Eosinophils Relative 05/01/2024 2.6  0.0 - 5.0 % Final   Basophils Relative 05/01/2024 0.8  0.0 - 3.0 % Final   Neutro Abs 05/01/2024 4.4  1.4 - 7.7 K/uL Final   Lymphs Abs 05/01/2024 2.8  0.7 - 4.0 K/uL Final   Monocytes Absolute 05/01/2024 0.6  0.1 - 1.0 K/uL Final   Eosinophils Absolute 05/01/2024 0.2  0.0 - 0.7 K/uL Final   Basophils Absolute 05/01/2024 0.1  0.0 - 0.1 K/uL Final   Sodium 05/01/2024 143  135 - 145 mEq/L Final   Potassium 05/01/2024 5.3 No hemolysis seen (H)  3.5 - 5.1 mEq/L Final   Chloride 05/01/2024 104  96 - 112 mEq/L Final   CO2 05/01/2024 32  19 - 32 mEq/L Final   Glucose, Bld  05/01/2024 80  70 - 99 mg/dL Final   BUN 88/96/7974 19  6 - 23 mg/dL Final   Creatinine, Ser 05/01/2024 1.26  0.40 - 1.50 mg/dL Final   Total Bilirubin 05/01/2024 0.3  0.2 - 1.2 mg/dL Final   Alkaline Phosphatase 05/01/2024 77  39 -  117 U/L Final   AST 05/01/2024 28  0 - 37 U/L Final   ALT 05/01/2024 25  0 - 53 U/L Final   Total Protein 05/01/2024 7.0  6.0 - 8.3 g/dL Final   Albumin 88/96/7974 4.6  3.5 - 5.2 g/dL Final   GFR 88/96/7974 66.02  >60.00 mL/min Final   Calcium 05/01/2024 9.8  8.4 - 10.5 mg/dL Final   Lipase 88/96/7974 20.0  11.0 - 59.0 U/L Final  Office Visit on 11/19/2023  Component Date Value Ref Range Status   Cholesterol 11/19/2023 186  0 - 200 mg/dL Final   Triglycerides 94/76/7974 66.0  0.0 - 149.0 mg/dL Final   HDL 94/76/7974 51.40  >39.00 mg/dL Final   VLDL 94/76/7974 13.2  0.0 - 40.0 mg/dL Final   LDL Cholesterol 11/19/2023 122 (H)  0 - 99 mg/dL Final   Total CHOL/HDL Ratio 11/19/2023 4   Final   NonHDL 11/19/2023 134.87   Final   Sodium 11/19/2023 141  135 - 145 mEq/L Final   Potassium 11/19/2023 4.3  3.5 - 5.1 mEq/L Final   Chloride 11/19/2023 103  96 - 112 mEq/L Final   CO2 11/19/2023 30  19 - 32 mEq/L Final   Glucose, Bld 11/19/2023 89  70 - 99 mg/dL Final   BUN 94/76/7974 20  6 - 23 mg/dL Final   Creatinine, Ser 11/19/2023 0.93  0.40 - 1.50 mg/dL Final   Total Bilirubin 11/19/2023 0.5  0.2 - 1.2 mg/dL Final   Alkaline Phosphatase 11/19/2023 68  39 - 117 U/L Final   AST 11/19/2023 19  0 - 37 U/L Final   ALT 11/19/2023 17  0 - 53 U/L Final   Total Protein 11/19/2023 7.1  6.0 - 8.3 g/dL Final   Albumin 94/76/7974 4.7  3.5 - 5.2 g/dL Final   GFR 94/76/7974 95.35  >60.00 mL/min Final   Calcium 11/19/2023 9.8  8.4 - 10.5 mg/dL Final   TSH 94/76/7974 1.600  0.450 - 4.500 uIU/mL Final   Hgb A1c MFr Bld 11/19/2023 5.6  4.6 - 6.5 % Final   PSA 11/19/2023 0.38  0.10 - 4.00 ng/mL Final  Office Visit on 08/20/2023  Component Date Value Ref Range Status    Influenza A, POC 08/20/2023 Negative  Negative Final   Influenza B, POC 08/20/2023 Negative  Negative Final   SARS Coronavirus 2 Ag 08/20/2023 Negative  Negative Final  Lab on 11/25/2022  Component Date Value Ref Range Status   WBC 11/25/2022 9.3  4.0 - 10.5 K/uL Final   RBC 11/25/2022 4.39  4.22 - 5.81 Mil/uL Final   Hemoglobin 11/25/2022 13.4  13.0 - 17.0 g/dL Final   HCT 94/70/7975 39.5  39.0 - 52.0 % Final   MCV 11/25/2022 90.1  78.0 - 100.0 fl Final   MCHC 11/25/2022 33.9  30.0 - 36.0 g/dL Final   RDW 94/70/7975 13.1  11.5 - 15.5 % Final   Platelets 11/25/2022 230.0  150.0 - 400.0 K/uL Final   Neutrophils Relative % 11/25/2022 54.9  43.0 - 77.0 % Final   Lymphocytes Relative 11/25/2022 34.2  12.0 - 46.0 % Final   Monocytes Relative 11/25/2022 6.4  3.0 - 12.0 % Final   Eosinophils Relative 11/25/2022 3.9  0.0 - 5.0 % Final   Basophils Relative 11/25/2022 0.6  0.0 - 3.0 % Final   Neutro Abs 11/25/2022 5.1  1.4 - 7.7 K/uL Final   Lymphs Abs 11/25/2022 3.2  0.7 - 4.0 K/uL Final   Monocytes Absolute  11/25/2022 0.6  0.1 - 1.0 K/uL Final   Eosinophils Absolute 11/25/2022 0.4  0.0 - 0.7 K/uL Final   Basophils Absolute 11/25/2022 0.1  0.0 - 0.1 K/uL Final   Hepatitis C Ab 11/25/2022 NON-REACTIVE  NON-REACTIVE Final   Sodium 11/25/2022 140  135 - 145 mEq/L Final   Potassium 11/25/2022 4.4  3.5 - 5.1 mEq/L Final   Chloride 11/25/2022 104  96 - 112 mEq/L Final   CO2 11/25/2022 30  19 - 32 mEq/L Final   Glucose, Bld 11/25/2022 96  70 - 99 mg/dL Final   BUN 94/70/7975 16  6 - 23 mg/dL Final   Creatinine, Ser 11/25/2022 0.90  0.40 - 1.50 mg/dL Final   Total Bilirubin 11/25/2022 0.3  0.2 - 1.2 mg/dL Final   Alkaline Phosphatase 11/25/2022 62  39 - 117 U/L Final   AST 11/25/2022 15  0 - 37 U/L Final   ALT 11/25/2022 14  0 - 53 U/L Final   Total Protein 11/25/2022 6.8  6.0 - 8.3 g/dL Final   Albumin 94/70/7975 4.1  3.5 - 5.2 g/dL Final   GFR 94/70/7975 99.86  >60.00 mL/min Final   Calcium  11/25/2022 9.4  8.4 - 10.5 mg/dL Final   Cholesterol 94/70/7975 157  0 - 200 mg/dL Final   Triglycerides 94/70/7975 48.0  0.0 - 149.0 mg/dL Final   HDL 94/70/7975 46.70  >39.00 mg/dL Final   VLDL 94/70/7975 9.6  0.0 - 59.9 mg/dL Final   LDL Cholesterol 11/25/2022 100 (H)  0 - 99 mg/dL Final   Total CHOL/HDL Ratio 11/25/2022 3   Final   NonHDL 11/25/2022 109.85   Final   TSH 11/25/2022 1.35  0.35 - 5.50 uIU/mL Final   Hgb A1c MFr Bld 11/25/2022 5.7  4.6 - 6.5 % Final  Office Visit on 11/11/2022  Component Date Value Ref Range Status   Rapid Strep A Screen 11/11/2022 Negative  Negative Final  There may be more visits with results that are not included.  No image results found. DG Chest Port 1 View Result Date: 05/18/2024 EXAM: 1 VIEW(S) XRAY OF THE CHEST 05/18/2024 10:36:00 AM COMPARISON: None available. CLINICAL HISTORY: Chest tightness FINDINGS: LUNGS AND PLEURA: No focal pulmonary opacity. No pleural effusion. No pneumothorax. HEART AND MEDIASTINUM: No acute abnormality of the cardiac and mediastinal silhouettes. BONES AND SOFT TISSUES: No acute osseous abnormality. IMPRESSION: 1. No acute cardiopulmonary process. Electronically signed by: Branson Kranz Salvage MD 05/18/2024 11:23 AM EST RP Workstation: [id number]   CT ABDOMEN PELVIS W CONTRAST Result Date: 05/04/2024 CLINICAL DATA:  Left lower quadrant pain several days. EXAM: CT ABDOMEN AND PELVIS WITH CONTRAST TECHNIQUE: Multidetector CT imaging of the abdomen and pelvis was performed using the standard protocol following bolus administration of intravenous contrast. RADIATION DOSE REDUCTION: This exam was performed according to the departmental dose-optimization program which includes automated exposure control, adjustment of the mA and/or kV according to patient size and/or use of iterative reconstruction technique. CONTRAST:  OMNIPAQUE  IOHEXOL  300 MG/ML  SOLN COMPARISON:  None Available. FINDINGS: Lower chest: Heart is normal size.   Visualized lung bases are clear. Hepatobiliary: Liver, gallbladder and biliary tree are normal. Pancreas: Normal. Spleen: Normal. Adrenals/Urinary Tract: Adrenal glands are normal. Kidneys are normal in size without hydronephrosis or nephrolithiasis. There is patchy cortical low-attenuation over the left kidney somewhat masslike in areas. These findings likely due to infection/pyelonephritis. Underlying mass is possible, but less likely. No perinephric inflammation or fluid. Ureters and bladder are normal. Stomach/Bowel: Stomach and  small bowel are normal. Appendix is normal. There is diverticulosis throughout the colon without active inflammation. Vascular/Lymphatic: Normal caliber of the abdominal aorta with minimal calcified plaque over the distal abdominal aorta. Remaining vascular structures are unremarkable. No adenopathy. Reproductive: Prostate is unremarkable. Other: No free peritoneal fluid or focal inflammatory change. Musculoskeletal: No focal abnormality. IMPRESSION: 1. Patchy cortical low-attenuation over the left kidney likely due to infection/pyelonephritis. Underlying mass is possible, but less likely. Recommend follow-up renal ultrasound 6-8 weeks after appropriate treatment. 2. Colonic diverticulosis without active inflammation. 3. Aortic atherosclerosis. Aortic Atherosclerosis (ICD10-I70.0). Electronically Signed   By: Toribio Agreste M.D.   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       Orders Placed in Encounter:   Lab Orders  No laboratory test(s) ordered today   Imaging Orders  No imaging studies ordered today   Referral Orders  No referral(s) requested today   No orders  of the defined types were placed in this encounter.   No orders of the defined types were placed in this encounter. ED Discharge Orders     None         This document was synthesized by artificial intelligence (Abridge) using HIPAA-compliant recording of the clinical interaction;   We discussed the use of AI scribe software for clinical note transcription with the patient, who gave verbal consent to proceed. additional Info: This encounter employed state-of-the-art, real-time, collaborative documentation. The patient actively reviewed and assisted in updating their electronic medical record on a shared screen, ensuring transparency and facilitating joint problem-solving for the problem list, overview, and plan. This approach promotes accurate, informed care. The treatment plan was discussed and reviewed in detail, including medication safety, potential side effects, and all patient questions. We confirmed understanding and comfort with the plan. Follow-up instructions were established, including contacting the office for any concerns, returning if symptoms worsen, persist, or new symptoms develop, and precautions for potential emergency department visits.

## 2024-05-30 NOTE — Progress Notes (Signed)
 Complex Care Management Care Guide Note  05/30/2024 Name: Joseph Williamson MRN: [id number] DOB: [birth year]  Joseph Williamson is a 53 y.o. year old male who is a primary care patient of Jesus Bernardino MATSU, MD and is actively engaged with the care management team. I reached out to Prentice LOISE Igo by phone today to assist with re-scheduling  with the BSW.  Follow up plan: Unsuccessful telephone outreach attempt made. A HIPAA compliant phone message was left for the patient providing contact information and requesting a return call. No further outreach attempts will be made due to inability to maintain patient contact.   Thedford Tsukamoto, CMA [place]  [facility] The Guide Direct Dial: [phone]  Fax: [phone] Website: [email or web address]

## 2024-05-30 NOTE — Patient Instructions (Addendum)
 ?? When to Go to the ER for Back Pain Seek emergency care now if back pain is accompanied by any of the following signs: Back pain after a trauma -- e.g. a fall, car accident, or other injury. [facility]) Sudden, severe back pain that came on quickly and is much worse than usual or is intolerable. ([email or web address]) Any new weakness, numbness, or tingling in one or both legs, feet, or groin / pelvic area. ([email or web address]) Inability or difficulty walking or standing. [facility]) New loss of bladder or bowel control, or trouble urinating (difficulty voiding, urinary retention, or incontinence). ([email or web address]-health.com) Numbness or "saddle-area" numbness (groin, buttocks, genital area). ([email or web address]-health.com) Back pain that radiates to the abdomen or wraps around to the front -- especially if severe or sudden. [facility]) Back pain accompanied by fever, chills, or signs of systemic illness -- may indicate infection. [facility]) Unexplained weight loss, or other "constitutional" symptoms (night sweats, general illness feeling) in conjunction with back pain. [facility]) Sudden back pain in someone at risk for fractures (e.g. osteoporosis, long-term steroid use), even if no trauma. [facility])  Why These Symptoms Are Red Flags These signs may indicate serious underlying conditions -- for example problems with the spinal cord or nerves (e.g. Cauda equina syndrome), a spinal fracture, spinal infection, or even more systemic disease. (spine-health.com) Delays in care when red-flag symptoms are present may lead to permanent nerve damage, paralysis, incontinence, or other serious complications. ([employer])  What to Do If These Symptoms Occur Call 911 or have someone drive you to the nearest Emergency Department (ER) -- do not wait for your scheduled MRI or outpatient appointment if red-flag symptoms appear. If you're unable to get to the ER, call for emergency medical help (ambulance). Once there, medical  staff may perform immediate evaluation, imaging (X-ray, CT, MRI), neurological exam, and other tests to find the underlying cause. [facility])  What Usually Doesn't Require ER -- and When to Follow Up as Outpatient If your back pain is more like "typical" back pain -- e.g. gradually increasing ache, stiffness, soreness without red-flag signs -- it's often safe to wait for your scheduled MRI or see your primary care / spine specialist. [facility])  ?? Suggested Patient Instructions (copyable) If you have any of the following, go to the ER immediately: severe sudden pain after injury, new weakness/numbness in legs or groin, loss of bowel/bladder control, trouble walking or standing, fever, or any other concerning symptoms listed above. If your back pain is uncomfortable but stable (no red-flag signs), continue your usual care plan and wait for your MRI -- contact your provider if it worsens or does not improve.      ?? YOUR LIFELINE: [facility]   ?? IMMEDIATE HELP AVAILABLE - NO APPOINTMENT NEEDED ?? Address: [address], [place], [place] [address] ?? Direct Line: [phone] ?? Hours: Open 24/7 - Every Day of the Year ?? Cost: Accepts Most Insurance Plans Including Medicaid    When Should You Go to the [facility]?  YOU SHOULD GO IF YOU'RE EXPERIENCING:  ?? Depression symptoms - Can't sleep or sleeping too much, no appetite or eating too much, feelings of worthlessness ?? Overwhelming anxiety that's interfering with your daily life ?? Substance use concerns - drinking or drug use that's gotten out of control ?? Troubling thoughts - thoughts of hurting yourself or others ?? Mood swings - extreme highs and lows that are disrupting your relationships ?? Confusion or disorientation -  feeling disconnected from reality ?? Crisis situations - when you feel like you can't cope and need immediate support Why Patients Love This Center:      ?? A Better Care in Mind - This is Their Motto and Promise This is [place] 's first dedicated behavioral health urgent care facility - specially designed for mental health and substance use crises, not medical emergencies.   ?? What You Get ?? Why This Matters for You  No Wait Lists Walk in any time, day or night - you'll be seen promptly when you're in crisis  Specialized Environment Calming, therapeutic space designed specifically for mental health - not a scary emergency room  Expert Team Mental health clinicians and medical providers who understand exactly what you're going through  Comprehensive Care Assessment, crisis stabilization, safety planning, and connection to ongoing support  Natural Light & Comfort Large windows, comfortable spaces, and a healing courtyard - feels more like a wellness center  Complete Services Under One Roof: ?? Assessment Thorough mental health evaluation with immediate safety planning  ?? Crisis Stabilization 2-24 hours of intensive support until you're feeling safer and more stable  ??? Short-term Admission 16-bed crisis center for 2-5 day stays when you need more intensive support  ?? Medication Support On-site pharmacy and medication management by psychiatrists  ?? Peer Counseling Connect with others who understand your experience firsthand  ?? Care Coordination Seamless connection to ongoing therapy, psychiatry, and community resources  ? HOW LONG WILL YOU BE THERE?  Assessment: About 1 hour  Urgent Care: 2-24 hours (until you're stable)  Crisis Center Stay: 2-5 days if you need intensive support  What to Expect When You Arrive: ?? Welcoming Reception: Friendly staff who understand you're having a difficult time ?? Quick Check-in: Brief registration process - they'll handle insurance details ?? Medical Screening: Quick check to make sure you're medically stable ?? Mental Health Assessment: Private conversation with a trained clinician about what's  happening ?? Safety Planning: Working together to create a plan to keep you safe ?? Next Steps: Connection to ongoing care and resources in the community ???????? FAMILY SUPPORT INCLUDED: Your loved ones can be involved in your care. The team encourages family participation and will help coordinate with those supporting you at home.    ?? AGES SERVED: Children (ages 49-17) and Adults - specialized care for each age group with age-appropriate environments and treatment approaches.  ?? Getting There: Address: [address], [place], [place] [address] Parking: Free parking [employer], [employer] Transit: Accessible via [employer] bus routes Mobile Crisis: If you can't get there, call [phone] for mobile crisis team ?? Insurance & Payment: Insurance Accepted What This Means for You  [place] Medicaid (All Plans) Full coverage - no cost to you  [employer] Covered like any urgent care visit  Self-Pay Options Financial assistance available - don't let cost stop you from getting help   ?? WHEN TO CALL 911 INSTEAD: You're having a medical emergency (chest pain, difficulty breathing, severe injury) You've overdosed on drugs or alcohol You're actively trying to harm yourself or someone else ?? FOR IMMEDIATE CRISIS SUPPORT: 988: National Suicide & Crisis Lifeline (call or text) (301) 433-0715: Mobile Crisis Team (they come to you)         ?? What Patients Say: Instead of sitting in an ER for hours feeling judged, I was seen immediately by people who understood exactly what I was going through. They helped me create a safety plan and connected me with  a therapist the same week. The environment was so calming - nothing like what I expected. It felt more like a wellness center than a hospital.  ?? TRUSTED PARTNERSHIP: This facility is a collaboration between [employer] and [employer] - bringing together the best of public health commitment and actor.  ?? Special  Programs & Resources: ???? Fully Accessible: ADA compliant for all physical abilities ?? Multilingual Support: Interpreters available for non-English speakers ????? LGBTQ+ Affirming: Safe, respectful care for all identities ??? Veteran-Friendly: Staff trained in eli lilly and company culture and trauma ?? Trauma-Informed Care: Understanding of how trauma affects healing ??? EASY TO FIND   From [address] Take [address] ([address]), turn left on [address]  From [employer] on [address], turn right on [address]  Landmarks Near [facility]   ?? YOU DON'T HAVE TO SUFFER ALONE This facility exists because your mental health matters. The community invested $20 million to make sure you have a place to go when you're struggling. You deserve help, you deserve to feel better, and you deserve to be treated with dignity and respect. ?? Call [phone] or just walk in - someone is waiting to help you 24/7.    ?? QUICK REFERENCE: Address: [address] [place], [place] [address] Phone: [phone] Hours: 24/7/365 - Always Open Cost: Covered by most insurance, financial assistance available No Appointment Needed - Just come when you need help

## 2024-05-31 ENCOUNTER — Ambulatory Visit (AMBULATORY_SURGERY_CENTER): Admitting: *Deleted

## 2024-05-31 VITALS — Ht 73.0 in | Wt 220.0 lb

## 2024-05-31 DIAGNOSIS — Z860100 Personal history of colon polyps, unspecified: Secondary | ICD-10-CM

## 2024-05-31 DIAGNOSIS — Z8371 Family history of adenomatous and serrated polyps: Secondary | ICD-10-CM

## 2024-05-31 MED ORDER — NA SULFATE-K SULFATE-MG SULF 17.5-3.13-1.6 GM/177ML PO SOLN: 1.0000 | Freq: Once | ORAL | 0 refills | Status: AC

## 2024-05-31 NOTE — Progress Notes (Addendum)
 Pt's name and DOB verified at the beginning of the pre-visit with 2 identifiers  Pt denies any difficulty with ambulating,sitting, laying down or rolling side to side  Pt has no issues moving head neck or swallowing  No egg or soy allergy known to patient   No issues known to pt with past sedation  No FH of Malignant Hyperthermia  Pt is not on home 02   Pt is not on blood thinners   Pt denies issues with constipation   Pt is not on dialysis  Pt denise any abnormal heart rhythms   Pt denies any upcoming cardiac testing  Patient's chart reviewed by Norleen Schillings CNRA prior to pre-visit and patient appropriate for the [facility].  Pre-visit completed and red dot placed by patient's name on their procedure day (on provider's schedule).     Visit in person  Pt states weight is 220 lb  Pt given  both [facility] main # and MD on call # prior to instructions.  Informed pt to come in at the time discussed and is shown on PV instructions.  Pt instructed to use Singlecare.com or GoodRx for a price reduction on prep  Instructed pt where to find PV instructions in My Ch. Copy of instructions given to pt Instructed pt on all aspects of written instructions including med holds clothing to wear and foods to eat and not eat as well as after procedure legal restrictions and to call MD on call if needed.. Pt states understanding. Instructed pt to review instructions again prior to procedure and call main # given if has any questions or any issues. Pt states they will.

## 2024-05-31 NOTE — Assessment & Plan Note (Signed)
 We discussed and after shared decision making he agreed to start rosuvastatin

## 2024-06-01 ENCOUNTER — Inpatient Hospital Stay: Admission: RE | Admit: 2024-06-01 | Discharge: 2024-06-01 | Attending: Urology | Admitting: Urology

## 2024-06-01 DIAGNOSIS — R93429 Abnormal radiologic findings on diagnostic imaging of unspecified kidney: Secondary | ICD-10-CM

## 2024-06-01 DIAGNOSIS — K5730 Diverticulosis of large intestine without perforation or abscess without bleeding: Secondary | ICD-10-CM | POA: Diagnosis not present

## 2024-06-01 MED ORDER — IOPAMIDOL (ISOVUE-300) INJECTION 61%
100.0000 mL | Freq: Once | INTRAVENOUS | Status: AC | PRN
  Administered 2024-06-01: 100 mL via INTRAVENOUS

## 2024-06-07 ENCOUNTER — Encounter: Payer: Self-pay | Admitting: Internal Medicine

## 2024-06-09 DIAGNOSIS — M5416 Radiculopathy, lumbar region: Secondary | ICD-10-CM | POA: Diagnosis not present

## 2024-06-09 DIAGNOSIS — M48062 Spinal stenosis, lumbar region with neurogenic claudication: Secondary | ICD-10-CM | POA: Diagnosis not present

## 2024-06-13 NOTE — Progress Notes (Unsigned)
 [facility] History and Physical   Primary Care Physician:  Jesus Bernardino MATSU, MD   Reason for Procedure:  Colon cancer screening  Plan:    Colonoscopy   The patient was provided an opportunity to ask questions and all were answered. The patient agreed with the plan.   HPI: Joseph Williamson is a 53 y.o. male presenting for a screening colonoscopy.  In 2015 he had a colonoscopy without neoplasia, performed by Dr. Aneita.  There is a reported family history of colon polyps in parents.   Past Medical History:  Diagnosis Date   Arthritis    Back pain    Cluster headaches    COVID-19 07/11/2021   History of chickenpox    Hyperlipidemia    Hypertension    Internal and external bleeding hemorrhoids    Perforation of right tympanic membrane 01/07/2021   Primary hypertension 09/11/2019   Pyelonephritis 05/11/2024   Recurrent acute serous otitis media of right ear 07/01/2021   Rhinitis medicamentosa 09/12/2019   Aware of afrins drawbacks      Past Surgical History:  Procedure Laterality Date   COLONOSCOPY     none     TRIGGER FINGER RELEASE Right 04/13/2023       Current Outpatient Medications  Medication Sig Dispense Refill   gabapentin  (NEURONTIN ) 300 MG capsule Take 2 capsules (600 mg total) by mouth 3 (three) times daily. 540 capsule 3   ibuprofen  (ADVIL ) 800 MG tablet Take 1 tablet (800 mg total) by mouth every 8 (eight) hours as needed. 90 tablet 4   lisinopril  (ZESTRIL ) 20 MG tablet Take 1 tablet (20 mg total) by mouth daily. Replaces 10 mg dosing. 90 tablet 3   propranolol  ER (INDERAL  LA) 60 MG 24 hr capsule Take 1 capsule (60 mg total) by mouth daily. 90 capsule 4   rosuvastatin  (CRESTOR ) 20 MG tablet Take 1 tablet (20 mg total) by mouth daily. 90 tablet 4   ARIPiprazole  (ABILIFY ) 5 MG tablet Take 1 tablet (5 mg total) by mouth daily. (Patient not taking: Reported on 05/31/2024) 90 tablet 4   FLUoxetine  (PROZAC ) 20 MG capsule Take 1 capsule (20 mg total) by  mouth daily. (Patient not taking: Reported on 05/31/2024) 90 capsule 3   HYDROcodone -acetaminophen  (NORCO) 10-325 MG tablet Take 1 tablet by mouth every 4 (four) hours as needed for up to 10 days. Fill when due 60 tablet 0   ondansetron  (ZOFRAN -ODT) 4 MG disintegrating tablet Take by mouth as needed.     Current Facility-Administered Medications  Medication Dose Route Frequency Provider Last Rate Last Admin   0.9 %  sodium chloride  infusion  500 mL Intravenous Once Avram Lupita BRAVO, MD        Allergies as of 06/14/2024   (No Known Allergies)    Family History  Problem Relation Age of Onset   Arthritis Mother    Cancer Mother        Pancreactic   Colon polyps Mother    Pancreatic cancer Mother    Gout Father    Kidney Stones Father    Colon polyps Father    Stroke Father        2/2 COVID   Colon cancer Neg Hx    Diabetes Neg Hx    Kidney disease Neg Hx    Liver disease Neg Hx    Rectal cancer Neg Hx    Stomach cancer Neg Hx     Social History   Socioeconomic History   Marital status:  Married    Spouse name: Not on file   Number of children: Not on file   Years of education: Not on file   Highest education level: GED or equivalent  Occupational History   Occupation: Event Organiser: [employer]  Tobacco Use   Smoking status: Former    Types: Cigarettes   Smokeless tobacco: Never   Tobacco comments:    Quit January 2025  Vaping Use   Vaping status: Never Used  Substance and Sexual Activity   Alcohol use: Not Currently    Alcohol/week: 14.0 standard drinks of alcohol    Types: 14 Cans of beer per week    Comment: quit some time ago   Drug use: No   Sexual activity: Yes  Other Topics Concern   Not on file  Social History Narrative   Not on file   Social Drivers of Health   Tobacco Use: High Risk (06/09/2024)   Received from [facility]   Patient History    Smoking Tobacco Use: Every Day    Smokeless Tobacco Use: Never     Passive Exposure: Not on file  Financial Resource Strain: Medium Risk (05/30/2024)   Overall Financial Resource Strain (CARDIA)    Difficulty of Paying Living Expenses: Somewhat hard  Food Insecurity: No Food Insecurity (05/30/2024)   Epic    Worried About Radiation Protection Practitioner of Food in the Last Year: Never true    Ran Out of Food in the Last Year: Never true  Transportation Needs: No Transportation Needs (05/30/2024)   Epic    Lack of Transportation (Medical): No    Lack of Transportation (Non-Medical): No  Physical Activity: Sufficiently Active (05/30/2024)   Exercise Vital Sign    Days of Exercise per Week: 5 days    Minutes of Exercise per Session: 120 min  Stress: Stress Concern Present (05/30/2024)   [employer] of Occupational Health - Occupational Stress Questionnaire    Feeling of Stress: To some extent  Social Connections: Moderately Integrated (05/30/2024)   Social Connection and Isolation Panel    Frequency of Communication with Friends and Family: More than three times a week    Frequency of Social Gatherings with Friends and Family: Twice a week    Attends Religious Services: 1 to 4 times per year    Active Member of [employer] or Organizations: No    Attends Engineer, Structural: Not on file    Marital Status: Married  Catering Manager Violence: Not on file  Depression (PHQ2-9): Low Risk (11/19/2023)   Depression (PHQ2-9)    PHQ-2 Score: 0  Alcohol Screen: Not on file  Housing: Unknown (05/30/2024)   Epic    Unable to Pay for Housing in the Last Year: No    Number of Times Moved in the Last Year: Not on file    Homeless in the Last Year: No  Utilities: Not on file  Health Literacy: Not on file    Review of Systems:  All other review of systems negative except as mentioned in the HPI.  Physical Exam: Vital signs BP 124/80   Pulse 62   Temp 98.2 F (36.8 C)   Ht 6' 1 (1.854 m)   Wt 223 lb (101.2 kg)   SpO2 96%   BMI 29.42 kg/m   General:   Alert,   Well-developed, well-nourished, pleasant and cooperative in NAD Lungs:  Clear throughout to auscultation.   Heart:  Regular rate and rhythm; no  murmurs, clicks, rubs,  or gallops. Abdomen:  Soft, nontender and nondistended. Normal bowel sounds.   Neuro/Psych:  Alert and cooperative. Normal mood and affect. A and O x 3   @Joandy Burget  CHARLENA Commander, MD, [facility] [phone] (pager) 06/14/2024 11:00 AM@

## 2024-06-14 ENCOUNTER — Encounter: Payer: Self-pay | Admitting: Internal Medicine

## 2024-06-14 ENCOUNTER — Ambulatory Visit: Admitting: Internal Medicine

## 2024-06-14 VITALS — BP 88/61 | HR 67 | Temp 98.2°F | Resp 16 | Ht 73.0 in | Wt 223.0 lb

## 2024-06-14 DIAGNOSIS — K644 Residual hemorrhoidal skin tags: Secondary | ICD-10-CM

## 2024-06-14 DIAGNOSIS — D125 Benign neoplasm of sigmoid colon: Secondary | ICD-10-CM | POA: Diagnosis not present

## 2024-06-14 DIAGNOSIS — K5730 Diverticulosis of large intestine without perforation or abscess without bleeding: Secondary | ICD-10-CM | POA: Diagnosis not present

## 2024-06-14 DIAGNOSIS — K635 Polyp of colon: Secondary | ICD-10-CM

## 2024-06-14 DIAGNOSIS — E785 Hyperlipidemia, unspecified: Secondary | ICD-10-CM | POA: Diagnosis not present

## 2024-06-14 DIAGNOSIS — Z1211 Encounter for screening for malignant neoplasm of colon: Secondary | ICD-10-CM | POA: Diagnosis not present

## 2024-06-14 DIAGNOSIS — K648 Other hemorrhoids: Secondary | ICD-10-CM | POA: Diagnosis not present

## 2024-06-14 DIAGNOSIS — I10 Essential (primary) hypertension: Secondary | ICD-10-CM | POA: Diagnosis not present

## 2024-06-14 MED ORDER — SODIUM CHLORIDE 0.9 % IV SOLN: 500.0000 mL | Freq: Once | INTRAVENOUS | Status: DC

## 2024-06-14 NOTE — Progress Notes (Signed)
 Sedate, gd SR, tolerated procedure well, VSS, report to RN

## 2024-06-14 NOTE — Progress Notes (Signed)
 Pt's states no medical or surgical changes since previsit or office visit.

## 2024-06-14 NOTE — Op Note (Signed)
 [name] Procedure Date: 06/14/2024 10:57 AM MRN: [id number] Endoscopist: Lupita FORBES Commander , MD, [id number] Age: 53 Referring MD:  Date of Birth: 06-26-1971 Gender: Male Account #: [id number] Procedure:                Colonoscopy Indications:              Screening for colorectal malignant neoplasm Medicines:                Monitored Anesthesia Care Procedure:                Pre-Anesthesia Assessment:                           - Prior to the procedure, a History and Physical                            was performed, and patient medications and                            allergies were reviewed. The patient's tolerance of                            previous anesthesia was also reviewed. The risks                            and benefits of the procedure and the sedation                            options and risks were discussed with the patient.                            All questions were answered, and informed consent                            was obtained. Prior Anticoagulants: The patient has                            taken no anticoagulant or antiplatelet agents. ASA                            Grade Assessment: II - A patient with mild systemic                            disease. After reviewing the risks and benefits,                            the patient was deemed in satisfactory condition to                            undergo the procedure.                           After obtaining informed consent, the colonoscope  was passed under direct vision. Throughout the                            procedure, the patient's blood pressure, pulse, and                            oxygen saturations were monitored continuously. The                            Olympus Scope SN: [id number] was introduced through                            the anus and advanced to the the cecum, identified                            by  appendiceal orifice and ileocecal valve. The                            colonoscopy was performed without difficulty. The                            patient tolerated the procedure well. The quality                            of the bowel preparation was good. The ileocecal                            valve, appendiceal orifice, and rectum were                            photographed. The bowel preparation used was SUPREP                            via split dose instruction. Scope In: 11:10:17 AM Scope Out: 11:25:33 AM Scope Withdrawal Time: 0 hours 12 minutes 19 seconds  Total Procedure Duration: 0 hours 15 minutes 16 seconds  Findings:                 The perianal and digital rectal examinations were                            normal except for hemorrhoids. Pertinent negatives                            include normal prostate (size, shape, and                            consistency).                           Five sessile polyps were found in the sigmoid                            colon. The polyps were 3 to 5 mm in size. These  polyps were removed with a cold snare. Resection                            and retrieval were complete. Verification of                            patient identification for the specimen was done.                            Estimated blood loss was minimal.                           External and internal hemorrhoids were found.                           Multiple small-mouthed diverticula were found in                            the sigmoid colon and descending colon.                           The exam was otherwise without abnormality on                            direct and retroflexion views. Complications:            No immediate complications. Estimated Blood Loss:     Estimated blood loss was minimal. Impression:               - Five 3 to 5 mm polyps in the sigmoid colon,                            removed with a cold snare.  Resected and retrieved.                           - External and internal hemorrhoids.                           - Diverticulosis in the sigmoid colon and in the                            descending colon.                           - The examination was otherwise normal on direct                            and retroflexion views. Recommendation:           - Patient has a contact number available for                            emergencies. The signs and symptoms of potential                            delayed complications were discussed  with the                            patient. Return to normal activities tomorrow.                            Written discharge instructions were provided to the                            patient.                           - Resume previous diet.                           - Continue present medications.                           - Repeat colonoscopy is recommended. The                            colonoscopy date will be determined after pathology                            results from today's exam become available for                            review. Lupita FORBES Commander, MD 06/14/2024 11:34:22 AM This report has been signed electronically.

## 2024-06-14 NOTE — Patient Instructions (Addendum)
 I found and removed 5 tiny polyps.    You do have swollen hemorrhoids.  The preparation for colonoscopy often does that as hemorrhoids are normal structures but years are enlarged and swollen today. If they do give you problems with bleeding or other issues I could help you with a procedure called hemorrhoidal banding.  You may schedule that at your convenience in the office if desired.  I will let you know pathology results and when to have another routine colonoscopy by mail and/or My Chart.  I appreciate the opportunity to care for you. Lupita CHARLENA Commander, MD, FACG  YOU HAD AN ENDOSCOPIC PROCEDURE TODAY AT THE [facility]:   Refer to the procedure report that was given to you for any specific questions about what was found during the examination.  If the procedure report does not answer your questions, please call your gastroenterologist to clarify.  If you requested that your care partner not be given the details of your procedure findings, then the procedure report has been included in a sealed envelope for you to review at your convenience later.  YOU SHOULD EXPECT: Some feelings of bloating in the abdomen. Passage of more gas than usual.  Walking can help get rid of the air that was put into your GI tract during the procedure and reduce the bloating. If you had a lower endoscopy (such as a colonoscopy or flexible sigmoidoscopy) you may notice spotting of blood in your stool or on the toilet paper. If you underwent a bowel prep for your procedure, you may not have a normal bowel movement for a few days.  Please Note:  You might notice some irritation and congestion in your nose or some drainage.  This is from the oxygen used during your procedure.  There is no need for concern and it should clear up in a day or so.  SYMPTOMS TO REPORT IMMEDIATELY:  Following lower endoscopy (colonoscopy or flexible sigmoidoscopy):  Excessive amounts of blood in the stool  Significant tenderness or  worsening of abdominal pains  Swelling of the abdomen that is new, acute  Fever of 100F or higher  Resume previous diet Continue present medications Handouts on polyps, hemorrhoids and diverticulosis given   For urgent or emergent issues, a gastroenterologist can be reached at any hour by calling [phone]. Do not use MyChart messaging for urgent concerns.    DIET:  We do recommend a small meal at first, but then you may proceed to your regular diet.  Drink plenty of fluids but you should avoid alcoholic beverages for 24 hours.  ACTIVITY:  You should plan to take it easy for the rest of today and you should NOT DRIVE or use heavy machinery until tomorrow (because of the sedation medicines used during the test).    FOLLOW UP: Our staff will call the number listed on your records the next business day following your procedure.  We will call around 7:15- 8:00 am to check on you and address any questions or concerns that you may have regarding the information given to you following your procedure. If we do not reach you, we will leave a message.     If any biopsies were taken you will be contacted by phone or by letter within the next 1-3 weeks.  Please call us  at [phone] if you have not heard about the biopsies in 3 weeks.    SIGNATURES/CONFIDENTIALITY: You and/or your care partner have signed paperwork which will be entered  into your electronic medical record.  These signatures attest to the fact that that the information above on your After Visit Summary has been reviewed and is understood.  Full responsibility of the confidentiality of this discharge information lies with you and/or your care-partner.

## 2024-06-15 ENCOUNTER — Telehealth: Payer: Self-pay | Admitting: Lactation Services

## 2024-06-15 NOTE — Telephone Encounter (Signed)
 No answer left voice mail

## 2024-06-17 LAB — SURGICAL PATHOLOGY

## 2024-06-18 ENCOUNTER — Encounter: Payer: Self-pay | Admitting: Internal Medicine

## 2024-06-20 ENCOUNTER — Ambulatory Visit: Payer: Self-pay | Admitting: Internal Medicine

## 2024-06-27 ENCOUNTER — Ambulatory Visit: Payer: Self-pay | Admitting: Urology

## 2024-06-30 NOTE — Progress Notes (Deleted)
 "  Referring Physician:  Jesus Bernardino MATSU, MD [address] [place],  [place] [address]  Primary Physician:  Jesus Bernardino MATSU, MD  History of Present Illness: Mr. Sheppard Luckenbach has a history of hyperlipidemia, prediabetes, cluster headaches, HTN.   Last seen by me on 05/17/24 for chronic LBP and right posterior leg pain to his foot. He has known lumbar spondylosis and DDD. He has multilevel foraminal stenosis with mild central stenosis L3-L5. He has moderate/severe bilateral foraminal stenosis L5-S1.   He was sent to [facility] for injections and had repeat ESI on 06/09/24.   He is here for follow up.          He has chronic LBP and had injection in February that helped for months. His current pain is the same pain he had at that time but is on the right side- he thinks it was on right side before as well.   Now with 3-4 weeks of constant LBP with right posterior leg pain to his foot. He has constant numbness and tingling in right leg. No left leg pain. He has weakness in right leg. Pain is worse with movement. No alleviating factors.   He did PT earlier this year with short term relief. Had improvement with ESI as above (by Dr. Franklin at [facility]).   He is taking neurontin  and norco 10.   Tobacco use: Does not smoke.   Bowel/Bladder Dysfunction: none  Conservative measures: TENS Unit, HEP Physical therapy: has participated with [facility] 04/15/23-08/03/23?? (Confirm dates-waiting on records) Multimodal medical therapy including regular antiinflammatories: Gabapentin , Hydrocodone  Injections:   06/09/24:  Right S1 transforaminal ESI (dexamethasone  15 mg, right L5-S1 transforaminal approach was aborted)  08/16/2023-Left L5-S1 and left S1 TF ESI (helped 6-8 months)  Past Surgery: no spine surgery  TIMOTHY TRUDELL has no symptoms of cervical myelopathy.  The symptoms are causing a significant impact on the patient's life.   Review of Systems:  A 10 point review of systems is  negative, except for the pertinent positives and negatives detailed in the HPI.  Past Medical History: Past Medical History:  Diagnosis Date   Arthritis    Back pain    Cluster headaches    COVID-19 07/11/2021   History of chickenpox    Hyperlipidemia    Hypertension    Internal and external bleeding hemorrhoids    Perforation of right tympanic membrane 01/07/2021   Primary hypertension 09/11/2019   Pyelonephritis 05/11/2024   Recurrent acute serous otitis media of right ear 07/01/2021   Rhinitis medicamentosa 09/12/2019   Aware of afrins drawbacks      Past Surgical History: Past Surgical History:  Procedure Laterality Date   COLONOSCOPY     none     TRIGGER FINGER RELEASE Right 04/13/2023      Allergies: Allergies as of 07/05/2024   (No Known Allergies)    Medications: Outpatient Encounter Medications as of 07/05/2024  Medication Sig   ARIPiprazole  (ABILIFY ) 5 MG tablet Take 1 tablet (5 mg total) by mouth daily. (Patient not taking: Reported on 05/31/2024)   FLUoxetine  (PROZAC ) 20 MG capsule Take 1 capsule (20 mg total) by mouth daily. (Patient not taking: Reported on 05/31/2024)   gabapentin  (NEURONTIN ) 300 MG capsule Take 2 capsules (600 mg total) by mouth 3 (three) times daily.   HYDROcodone -acetaminophen  (NORCO) 10-325 MG tablet Take 1 tablet by mouth every 4 (four) hours as needed for up to 10 days. Fill when due   ibuprofen  (ADVIL ) 800 MG tablet  Take 1 tablet (800 mg total) by mouth every 8 (eight) hours as needed.   lisinopril  (ZESTRIL ) 20 MG tablet Take 1 tablet (20 mg total) by mouth daily. Replaces 10 mg dosing.   ondansetron  (ZOFRAN -ODT) 4 MG disintegrating tablet Take by mouth as needed.   propranolol  ER (INDERAL  LA) 60 MG 24 hr capsule Take 1 capsule (60 mg total) by mouth daily.   rosuvastatin  (CRESTOR ) 20 MG tablet Take 1 tablet (20 mg total) by mouth daily.   No facility-administered encounter medications on file as of 07/05/2024.    Social  History: Social History   Tobacco Use   Smoking status: Former    Types: Cigarettes   Smokeless tobacco: Never   Tobacco comments:    Quit January 2025  Vaping Use   Vaping status: Never Used  Substance Use Topics   Alcohol use: Not Currently    Alcohol/week: 14.0 standard drinks of alcohol    Types: 14 Cans of beer per week    Comment: quit some time ago   Drug use: No    Family Medical History: Family History  Problem Relation Age of Onset   Arthritis Mother    Cancer Mother        Pancreactic   Colon polyps Mother    Pancreatic cancer Mother    Gout Father    Kidney Stones Father    Colon polyps Father    Stroke Father        2/2 COVID   Colon cancer Neg Hx    Diabetes Neg Hx    Kidney disease Neg Hx    Liver disease Neg Hx    Rectal cancer Neg Hx    Stomach cancer Neg Hx     Physical Examination: There were no vitals filed for this visit.    Awake, alert, oriented to person, place, and time.  Speech is clear and fluent. Fund of knowledge is appropriate.   Cranial Nerves: Pupils equal round and reactive to light.  Facial tone is symmetric.    Mild right sided lower posterior lumbar tenderness.   No abnormal lesions on exposed skin.   Strength: Side Iliopsoas Quads Hamstring PF DF EHL  R 5 5 5 5 5 5   L 5 5 5 5 5 5    He has pain with strength testing in right lower extremity, but no gross weakness.   Reflexes are 2+ and symmetric at the patella and achilles.     Clonus is not present.   Bilateral lower extremity sensation is intact to light touch.     He has a limping gait favoring his right leg.   Medical Decision Making  Imaging: none  Assessment and Plan: Mr. Schack has chronic LBP and had injection in February that helped for months. His current pain is the same pain he had at that time- injection note states it was done on left side. He remembers his pain being on the right side.   Now with 3-4 weeks of constant LBP with right posterior  leg pain to his foot. He has constant numbness and tingling in right leg. No left leg pain. He has weakness in right leg.   As above, this pain is similar to his previous pain earlier this year, but is on right side instead of left side.   He has known lumbar spondylosis and DDD. He has multilevel foraminal stenosis with mild central stenosis L3-L5. He has moderate/severe bilateral foraminal stenosis L5-S1.   Likely pain generator is  L5-S1. He did great with previous left L5-S1 and left S1 TF ESI.   Treatment options discussed with patient and following plan made:   - Fast track injection order to [facility]R for right L5-S1 TF and right S1 TF ESI.  - Hold on revisiting PT for now. May revisit in the future.  - Continue on neurontin  and prn norco from other providers.  - If no improvement with above ESI, will plan to get updated lumbar MRI.  - Follow up with me in 6-8 weeks and prn.   BP was elevated. No symptoms of chest pain, shortness of breath, blurry vision, or headaches. He will recheck at home and call PCP if not improved. If he develops CP, SOB, blurry vision, or headaches, then he will go to ED. Message sent to PCP to advise them of elevated BP reading.    I spent a total of *** minutes in face-to-face and non-face-to-face activities related to this patient's care today including review of outside records, review of imaging, review of symptoms, physical exam, discussion of differential diagnosis, discussion of treatment options, and documentation.   Glade Boys PA-C Dept. of Neurosurgery  "

## 2024-07-05 ENCOUNTER — Ambulatory Visit: Admitting: Orthopedic Surgery

## 2024-07-14 ENCOUNTER — Other Ambulatory Visit: Payer: Self-pay | Admitting: Internal Medicine

## 2024-07-14 DIAGNOSIS — R110 Nausea: Secondary | ICD-10-CM

## 2024-07-19 ENCOUNTER — Encounter: Payer: Self-pay | Admitting: Internal Medicine

## 2024-07-25 NOTE — Progress Notes (Unsigned)
 "   Chief Complaint: Left sided abdominal pain with abnormal CT of the left kidney  History of Present Illness:  12.1.2025: Initial visit for this 54 year old male cfor further evaluation/management of an abnormality on CT scan.  He was deer hunting in early November of this year.  He developed sudden onset left flank pain that was fairly debilitating.  Not crampy in nature, but steady.  It continued for several days.  He went to the emergency room on 6 November.  CT scan revealed patchy areas of cortical low-attenuation in the left kidney.  Underlying mass was possible, felt unlikely, however.  There was diverticulosis without inflammation.  Pain lasted a few days and resolved.  He has been treated for high blood pressure since that time.  He was not having associated fever, chills, nausea or vomiting with this pain.  Not associated with change in lower urinary tract symptoms.  12.4.2025: Repeat CT scan.  Findings: FINDINGS: Lower chest: The lung bases are clear. No pleural effusion. The heart is normal in size. No pericardial effusion. Hepatobiliary: The liver is normal in size. Non-cirrhotic configuration. No suspicious mass. No intrahepatic or extrahepatic bile duct dilation. No calcified gallstones. Normal gallbladder wall thickness. No pericholecystic inflammatory changes. Pancreas: Unremarkable. No pancreatic ductal dilatation or surrounding inflammatory changes. Spleen: Within normal limits. No focal lesion. Adrenals/Urinary Tract: Adrenal glands are unremarkable. No hydroureteronephrosis or nephroureterolithiasis on either side. No focal lesion in the right kidney. Redemonstration of mild asymmetric atrophy of the left kidney. There is focal area of scarring with associated capsular retraction in the left kidney upper pole, posteriorly. Redemonstration of 2 ill-defined heterogeneous hypoattenuating areas centered in the cortex of the upper pole and lower pole, medially. There  is no enhancement of the peripheral cortex (absence of cortical rim sign), which goes against renal infarction. Also, when compared to the prior CT scan from 05/04/2024, there is overall decrease in the size of the affected region which goes against neoplastic process. These constellation of findings favor resolving pyelonephritis more than renal infarction or focal mass. However, continued follow-up is recommended in 6-12 weeks to document resolution/progression of the disease. No other suspicious lesion in the left kidney. Urinary bladder is distended and appears within normal limits. No focal mass, bladder calculi or perivesical fat stranding. Stomach/Bowel: No disproportionate dilation of the small or large bowel loops. No evidence of abnormal bowel wall thickening or inflammatory changes. The appendix is unremarkable. There are multiple diverticula mainly in the left hemi colon, without imaging signs of diverticulitis. Vascular/Lymphatic: No ascites or pneumoperitoneum. No abdominal or pelvic lymphadenopathy, by size criteria. No aneurysmal dilation of the major abdominal arteries. Reproductive: Normal size prostate. Symmetric seminal vesicles. Other: There are fat containing umbilical and bilateral inguinal hernias. The soft tissues and abdominal wall are otherwise unremarkable. Musculoskeletal: No suspicious osseous lesions. There are mild multilevel degenerative changes in the visualized spine. New PICC 2 IMPRESSION: 1. Redemonstration of ill-defined heterogeneous hypoattenuating areas in the left kidney upper pole and lower pole, medially. Please see above for details and discussion. Overall, the constellation of findings favor resolving pyelonephritis more than renal infarction or focal mass. However, continued follow-up is recommended in 6-12 weeks to document resolution / progression of the disease. 2. Multiple other nonacute observations, as described  above.  1.28.2026: Past Medical History:  Past Medical History:  Diagnosis Date   Arthritis    Back pain    Cluster headaches    COVID-19 07/11/2021   History of chickenpox  Hyperlipidemia    Hypertension    Internal and external bleeding hemorrhoids    Perforation of right tympanic membrane 01/07/2021   Primary hypertension 09/11/2019   Pyelonephritis 05/11/2024   Recurrent acute serous otitis media of right ear 07/01/2021   Rhinitis medicamentosa 09/12/2019   Aware of afrins drawbacks      Past Surgical History:  Past Surgical History:  Procedure Laterality Date   COLONOSCOPY     none     TRIGGER FINGER RELEASE Right 04/13/2023      Allergies:  No Known Allergies  Family History:  Family History  Problem Relation Age of Onset   Arthritis Mother    Cancer Mother        Pancreactic   Colon polyps Mother    Pancreatic cancer Mother    Gout Father    Kidney Stones Father    Colon polyps Father    Stroke Father        2/2 COVID   Colon cancer Neg Hx    Diabetes Neg Hx    Kidney disease Neg Hx    Liver disease Neg Hx    Rectal cancer Neg Hx    Stomach cancer Neg Hx     Social History:  Social History   Tobacco Use   Smoking status: Former    Types: Cigarettes   Smokeless tobacco: Never   Tobacco comments:    Quit January 2025  Vaping Use   Vaping status: Never Used  Substance Use Topics   Alcohol use: Not Currently    Alcohol/week: 14.0 standard drinks of alcohol    Types: 14 Cans of beer per week    Comment: quit some time ago   Drug use: No    Review of symptoms:  Constitutional:  Negative for unexplained weight loss, night sweats, fever, chills ENT:  Negative for nose bleeds, sinus pain, painful swallowing CV:  Negative for chest pain, shortness of breath, exercise intolerance, palpitations, loss of consciousness Resp:  Negative for cough, wheezing, shortness of breath GI:  Negative for nausea, vomiting, diarrhea, bloody stools GU:   Positives noted in HPI; otherwise negative for gross hematuria, dysuria, urinary incontinence Neuro:  Negative for seizures, poor balance, limb weakness, slurred speech Psych:  Negative for lack of energy, depression, anxiety Endocrine:  Negative for polydipsia, polyuria, symptoms of hypoglycemia (dizziness, hunger, sweating) Hematologic:  Negative for anemia, purpura, petechia, prolonged or excessive bleeding, use of anticoagulants  Allergic:  Negative for difficulty breathing or choking as a result of exposure to anything; no shellfish allergy; no allergic response (rash/itch) to materials, foods  Physical exam: There were no vitals taken for this visit. GENERAL APPEARANCE:  Well appearing, well developed, well nourished, NAD HEENT: Atraumatic, Normocephalic. NECK: Normal appearance LUNGS: Normal inspiratory and expiratory excursion HEART: Regular Rate EXTREMITIES: Moves all extremities well.  Without clubbing, cyanosis, or edema. NEUROLOGIC:  Alert and oriented x 3, normal gait, CN II-XII grossly intact.  MENTAL STATUS:  Appropriate. SKIN:  Warm, dry and intact.    Results:  I have reviewed referring/prior physicians notes--ER notes reviewed  I have reviewed urinalysis--clear today  I have reviewed PSA results--0.38 earlier this year  I have reviewed prior imaging--CT A/P from 11.6.2025--images reviewed with the patient  I have reviewed urine culture results  Assessment: Abnormal appearance of left kidney on CT scan from 6 November.  To me, this may be decreased perfusion of a segment of the kidney.  In light of the patient's history of hypertension since  then, I wonder if this could be related to an embolic event   Plan:   I will have CT abdomen and pelvis with and without repeating in the next week or so, call with results    "

## 2024-07-26 ENCOUNTER — Ambulatory Visit: Admitting: Urology

## 2024-07-26 ENCOUNTER — Encounter: Payer: Self-pay | Admitting: Urology

## 2024-07-26 VITALS — BP 145/91 | HR 57 | Ht 73.0 in | Wt 242.0 lb

## 2024-07-26 DIAGNOSIS — R93429 Abnormal radiologic findings on diagnostic imaging of unspecified kidney: Secondary | ICD-10-CM

## 2024-07-26 DIAGNOSIS — N401 Enlarged prostate with lower urinary tract symptoms: Secondary | ICD-10-CM

## 2024-07-26 LAB — URINALYSIS, ROUTINE W REFLEX MICROSCOPIC
Bilirubin, UA: NEGATIVE
Glucose, UA: NEGATIVE
Leukocytes,UA: NEGATIVE
Nitrite, UA: NEGATIVE
Protein,UA: NEGATIVE
RBC, UA: NEGATIVE
Urobilinogen, Ur: 0.2 mg/dL (ref 0.2–1.0)
pH, UA: 5.5 (ref 5.0–7.5)

## 2024-07-26 LAB — MICROSCOPIC EXAMINATION: Bacteria, UA: NONE SEEN

## 2024-07-26 MED ORDER — ALFUZOSIN HCL ER 10 MG PO TB24: 10.0000 mg | ORAL_TABLET | Freq: Every day | ORAL | 11 refills | Status: AC

## 2024-11-21 ENCOUNTER — Encounter: Admitting: Internal Medicine
